# Patient Record
Sex: Male | Born: 1940 | Race: White | Hispanic: No | Smoking: Former smoker
Health system: Southern US, Community
[De-identification: ages and names within clinical notes are randomized; demographics above are authoritative.]

## PROBLEM LIST (undated history)

## (undated) DIAGNOSIS — E7800 Pure hypercholesterolemia, unspecified: Secondary | ICD-10-CM

## (undated) DIAGNOSIS — J449 Chronic obstructive pulmonary disease, unspecified: Secondary | ICD-10-CM

## (undated) DIAGNOSIS — I10 Essential (primary) hypertension: Secondary | ICD-10-CM

## (undated) HISTORY — PX: SPLENECTOMY, TOTAL: SHX788

## (undated) HISTORY — PX: CHOLECYSTECTOMY: SHX55

## (undated) HISTORY — PX: BLADDER REPAIR: SHX76

---

## 1998-04-28 ENCOUNTER — Encounter: Payer: Self-pay | Admitting: Emergency Medicine

## 1998-04-29 ENCOUNTER — Inpatient Hospital Stay (HOSPITAL_COMMUNITY): Admission: EM | Admit: 1998-04-29 | Discharge: 1998-05-04 | Payer: Self-pay | Admitting: Emergency Medicine

## 1998-05-08 ENCOUNTER — Encounter: Admission: RE | Admit: 1998-05-08 | Discharge: 1998-05-08 | Payer: Self-pay | Admitting: Internal Medicine

## 1998-06-21 ENCOUNTER — Ambulatory Visit (HOSPITAL_COMMUNITY): Admission: RE | Admit: 1998-06-21 | Discharge: 1998-06-21 | Payer: Self-pay | Admitting: Internal Medicine

## 1998-06-21 ENCOUNTER — Encounter: Admission: RE | Admit: 1998-06-21 | Discharge: 1998-06-21 | Payer: Self-pay | Admitting: Internal Medicine

## 1998-07-03 ENCOUNTER — Encounter: Payer: Self-pay | Admitting: *Deleted

## 1998-07-03 ENCOUNTER — Ambulatory Visit (HOSPITAL_COMMUNITY): Admission: RE | Admit: 1998-07-03 | Discharge: 1998-07-03 | Payer: Self-pay | Admitting: *Deleted

## 2001-03-07 ENCOUNTER — Encounter: Payer: Self-pay | Admitting: Urology

## 2001-03-07 ENCOUNTER — Encounter: Admission: RE | Admit: 2001-03-07 | Discharge: 2001-03-07 | Payer: Self-pay | Admitting: Urology

## 2001-03-14 ENCOUNTER — Encounter: Payer: Self-pay | Admitting: Urology

## 2001-03-14 ENCOUNTER — Ambulatory Visit: Admission: RE | Admit: 2001-03-14 | Discharge: 2001-03-14 | Payer: Self-pay | Admitting: Urology

## 2001-05-03 ENCOUNTER — Inpatient Hospital Stay (HOSPITAL_COMMUNITY): Admission: RE | Admit: 2001-05-03 | Discharge: 2001-05-10 | Payer: Self-pay | Admitting: *Deleted

## 2001-05-03 ENCOUNTER — Encounter (INDEPENDENT_AMBULATORY_CARE_PROVIDER_SITE_OTHER): Payer: Self-pay

## 2002-11-02 ENCOUNTER — Ambulatory Visit: Admission: RE | Admit: 2002-11-02 | Discharge: 2002-11-02 | Payer: Self-pay | Admitting: Surgery

## 2003-10-08 ENCOUNTER — Ambulatory Visit (HOSPITAL_COMMUNITY): Admission: RE | Admit: 2003-10-08 | Discharge: 2003-10-08 | Payer: Self-pay | Admitting: Internal Medicine

## 2004-10-09 ENCOUNTER — Ambulatory Visit (HOSPITAL_COMMUNITY): Admission: RE | Admit: 2004-10-09 | Discharge: 2004-10-09 | Payer: Self-pay | Admitting: Internal Medicine

## 2005-02-16 ENCOUNTER — Ambulatory Visit (HOSPITAL_COMMUNITY): Admission: RE | Admit: 2005-02-16 | Discharge: 2005-02-16 | Payer: Self-pay | Admitting: Urology

## 2005-02-16 ENCOUNTER — Ambulatory Visit (HOSPITAL_BASED_OUTPATIENT_CLINIC_OR_DEPARTMENT_OTHER): Admission: RE | Admit: 2005-02-16 | Discharge: 2005-02-16 | Payer: Self-pay | Admitting: Urology

## 2005-02-16 ENCOUNTER — Encounter (INDEPENDENT_AMBULATORY_CARE_PROVIDER_SITE_OTHER): Payer: Self-pay | Admitting: *Deleted

## 2005-03-23 ENCOUNTER — Encounter (INDEPENDENT_AMBULATORY_CARE_PROVIDER_SITE_OTHER): Payer: Self-pay | Admitting: *Deleted

## 2005-03-24 ENCOUNTER — Inpatient Hospital Stay (HOSPITAL_COMMUNITY): Admission: RE | Admit: 2005-03-24 | Discharge: 2005-03-30 | Payer: Self-pay | Admitting: Urology

## 2005-04-15 ENCOUNTER — Ambulatory Visit (HOSPITAL_COMMUNITY): Admission: RE | Admit: 2005-04-15 | Discharge: 2005-04-15 | Payer: Self-pay | Admitting: Internal Medicine

## 2006-01-01 ENCOUNTER — Ambulatory Visit (HOSPITAL_COMMUNITY): Admission: RE | Admit: 2006-01-01 | Discharge: 2006-01-01 | Payer: Self-pay | Admitting: Surgery

## 2006-01-01 ENCOUNTER — Encounter (INDEPENDENT_AMBULATORY_CARE_PROVIDER_SITE_OTHER): Payer: Self-pay | Admitting: Specialist

## 2006-04-07 ENCOUNTER — Encounter: Admission: RE | Admit: 2006-04-07 | Discharge: 2006-07-06 | Payer: Self-pay | Admitting: Internal Medicine

## 2007-11-24 ENCOUNTER — Ambulatory Visit (HOSPITAL_COMMUNITY): Admission: RE | Admit: 2007-11-24 | Discharge: 2007-11-24 | Payer: Self-pay | Admitting: Internal Medicine

## 2008-07-02 ENCOUNTER — Encounter (INDEPENDENT_AMBULATORY_CARE_PROVIDER_SITE_OTHER): Payer: Self-pay | Admitting: *Deleted

## 2008-10-03 ENCOUNTER — Ambulatory Visit: Payer: Self-pay | Admitting: Cardiology

## 2008-10-03 DIAGNOSIS — E1129 Type 2 diabetes mellitus with other diabetic kidney complication: Secondary | ICD-10-CM | POA: Insufficient documentation

## 2008-10-03 DIAGNOSIS — N320 Bladder-neck obstruction: Secondary | ICD-10-CM

## 2008-10-03 DIAGNOSIS — E782 Mixed hyperlipidemia: Secondary | ICD-10-CM

## 2008-10-03 DIAGNOSIS — Z6825 Body mass index (BMI) 25.0-25.9, adult: Secondary | ICD-10-CM

## 2008-10-10 ENCOUNTER — Telehealth (INDEPENDENT_AMBULATORY_CARE_PROVIDER_SITE_OTHER): Payer: Self-pay | Admitting: *Deleted

## 2008-10-11 ENCOUNTER — Encounter: Payer: Self-pay | Admitting: Cardiology

## 2008-10-11 ENCOUNTER — Ambulatory Visit: Payer: Self-pay

## 2008-10-31 ENCOUNTER — Telehealth: Payer: Self-pay | Admitting: Cardiology

## 2008-12-28 ENCOUNTER — Encounter: Payer: Self-pay | Admitting: Internal Medicine

## 2009-01-22 ENCOUNTER — Ambulatory Visit: Payer: Self-pay | Admitting: Internal Medicine

## 2009-01-24 ENCOUNTER — Encounter: Payer: Self-pay | Admitting: Internal Medicine

## 2009-02-19 ENCOUNTER — Encounter: Payer: Self-pay | Admitting: Internal Medicine

## 2009-02-19 ENCOUNTER — Ambulatory Visit: Payer: Self-pay | Admitting: Internal Medicine

## 2009-02-26 ENCOUNTER — Encounter: Payer: Self-pay | Admitting: Internal Medicine

## 2009-03-19 ENCOUNTER — Telehealth (INDEPENDENT_AMBULATORY_CARE_PROVIDER_SITE_OTHER): Payer: Self-pay | Admitting: *Deleted

## 2010-07-31 LAB — GLUCOSE, CAPILLARY
Glucose-Capillary: 122 mg/dL — ABNORMAL HIGH (ref 70–99)
Glucose-Capillary: 138 mg/dL — ABNORMAL HIGH (ref 70–99)

## 2010-09-12 NOTE — Op Note (Signed)
[facility]  Patient:    [name], [name] Visit Number: [id number] MRN: [id number]          Service Type: SUR Location: 2S 0251 01 Attending Physician:  Vikki Ports. Dictated by:   Vikki Ports, M.D. Proc. Date: 05/03/01 Admit Date:  05/03/2001   CC:         [name], M.D.   Operative Report  PREOPERATIVE DIAGNOSES:  1. 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  2. Cholelithiasis.  POSTOPERATIVE DIAGNOSES:  Not given.  SURGEON:  Vikki Ports, M.D.  ASSISTANT:  1. [name], M.D.  2. [name], M.D.  ANESTHESIA:  General endotracheal tube.  ESTIMATED BLOOD LOSS:  1.5 liters.  TRANSFUSION:  12 units of packed red blood cells and a ______ pack of platelets.  DRAINS:  A 19 Blake drain in the left upper quadrant.  DESCRIPTION OF PROCEDURE:  The patient was taken to the operating room and placed in the supine position. After adequate anesthesia was induced via the endotracheal tube, the abdomen and perineum were prepped and draped in the normal sterile fashion. Using a vertical midline incision, I dissected down to the fascia which was opened vertically. The peritoneum was entered. On entering the abdomen, there was noted a massively enlarged spleen extending medially across the left lobe of the liver superior to the diaphragm and inferiorly to the pelvic brim. There were very dense omental adhesions and the splenic flexure of the colon was pushed inferiorly down near the left iliac vessels. I began by taking down a significant amount of omentum densely adhered to the anterior surface of the spleen. The entire splenic capsule was encased in omentum and densely adhered to the retroperitoneum, diaphragm and the left lobe of the liver. Very tedious two hour dissection was undertaken to try to free up the spleen. Posterior attachments were very difficult to expose secondary to  the massive size of the spleen. The lesser sac was entered and the hilum of the spleen was exposed. 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 The inferior splenic veins were suture ligated using #0 silk ligatures on mobilizing the inferior portion of the spleen, there was massive arterial and venous bleeding from the superior hilum. A significant amount of blood was lost.  Using digital pressure, this was controlled. 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 Also numerous splenic vein tributaries were also identified bleeding and were ligated. After bleeding was well controlled, the posterior attachments to the spleen were then removed and the spleen was delivered out of the abdominal cavity. It weighed almost six pounds after removal. The splenic bud was then coagulated and Surgicel was placed along the retroperitoneum in the diaphragm. The diaphragm was inspected and there appeared to be no defects within the diaphragm and the left lobe of the liver was not bleeding. Packs were placed in the left upper quadrant and along the tail of the pancreas.  I then turned my attention to the gallbladder. 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 The cystic duct was identified down near the infundibulum and was dissected free, ligated with an #0 silk ligature and divided. The cystic artery was then identified, clamped and divided. The gallbladder was taken off the gallbladder bed using Bovie electrocautery. Adequate hemostasis was ensured. Again I inspected the left upper quadrant and there was no obvious injury to the pancreatic tail but a  19 Blake drain was placed through a separate stab wound and near the tail of the pancreas. Adequate hemostasis was ensured. At this portion of the case, Dr. Wanda Plump performed cystotomy with bladder stone removal and that will be dictated in a separate report. The fascia was  then closed with a running #1 Novofil. The skin was closed with staples. The patient tolerated the procedure but remained intubated and transferred to the recovery room and then to the intensive care unit in critical condition. Dictated by:   Vikki Ports, M.D. Attending Physician:  Danna Hefty R. DD:  05/03/01 TD:  05/03/01 Job: [id number]

## 2010-09-12 NOTE — Op Note (Signed)
NAME:  [name], [name]                 ACCOUNT NO.:  [id number]   MEDICAL RECORD NO.:  [id number]          PATIENT TYPE:  AMB   LOCATION:  [facility]                         FACILITY:  [facility]   PHYSICIAN:  Sigmund I. Patsi Sears, M.D.DATE OF BIRTH:  [birth year]   DATE OF PROCEDURE:  02/16/2005  DATE OF DISCHARGE:                                 OPERATIVE REPORT   PREOPERATIVE DIAGNOSIS:  PSA elevation of 16.   POSTOPERATIVE DIAGNOSIS:  PSA elevation of 16.   OPERATION:  Saturation biopsies of the prostate.   SURGEON:  Sigmund I. Patsi Sears, M.D.   RADIATION TECHNOLOGIST:  Elnita Maxwell.   PREPARATION:  After appropriate pre-anesthesia, the patient was brought to  the operating room and placed on the operating table in the dorsal supine  position where general LMA anesthesia was induced.  He was then replaced in  dorsal lithotomy position and was prepped with Betadine solution and draped  in the usual fashion.   REVIEW OF HISTORY:  Mr. Quirk is a 70 year old male, with a history of PSA  elevation of 16, AUA symptom score of 14, prostate size 138 mL, with an  abnormal PSA-2, for prostate biopsy today prior to consideration of  treatment of his bladder outlet.   PAST MEDICAL HISTORY:  1.  Bladder stone disease.  2.  Cholelithiasis.  3.  Massive splenomegaly.  4.  __________ .  5.  In 2003, the patient underwent splenectomy, as well as cystotomy and      removal of bladder stone.   PROCEDURE:  With the patient in the dorsal lithotomy position, the perineum  was prepped and draped in the usual fashion.  The transrectal ultrasound  unit was then placed and stabilized in position.  The prostate was  thoroughly evaluated, and there was no evidence of transurethral stone or  bladder abnormality.  The prostate was large as noted above (138 mL).   Cores were taken then from the right lateral, left lateral, right medial,  left medial, and apical areas.  Mild bleeding was noted perineally.  There  was  a suggestion of hematuria with Foley catheter placement, but no gross  hematuria.  The patient was then awakened and taken to the recovery room in  good condition.  A total of 20 biopsies were obtained.      Sigmund I. Patsi Sears, M.D.  Electronically Signed     SIT/MEDQ  D:  02/16/2005  T:  02/16/2005  Job:  [id number]

## 2010-09-12 NOTE — Op Note (Signed)
[facility]  Patient:    [name], [name] Visit Number: [id number] MRN: [id number]          Service Type: SUR Location: 4E 0420 01 Attending Physician:  Vikki Ports Dictated by:   Verl Dicker, M.D. Proc. Date: 05/03/01 Admit Date:  05/03/2001 Discharge Date: 05/10/2001                             Operative Report  DATE OF BIRTH:  [birth year]  PREOPERATIVE DIAGNOSIS:  Large bladder stone.  POSTOPERATIVE DIAGNOSIS:  Large bladder stone.  PROCEDURE: 1. Removal of stone. 2. Placement of 20 French Foley. 3. The procedure was done at the time of Dr. Lillie Columbia splenectomy and cholecystectomy.  DESCRIPTION OF PROCEDURE:  Once Dr. Luan Pulling had completed splenectomy and cholecystectomy, vertical midline incision was continued down to the pelvis. Indwelling Foley catheter was used to inflate the bladder with about 600 cc of normal saline.  An incision was made along the dome of the bladder.  A 3.5 cm opening made in the bladder.  A 3.4 cm stone was then grasped with ring forceps and gently extracted from the bladder.  The bladder was closed in two layers with 2-0 Vicryl.  Foley catheter was left to straight drain. Dr. Luan Pulling will dictate her portion of the procedure. Dictated by:   Verl Dicker, M.D. Attending Physician:  Danna Hefty R. DD:  05/20/01 TD:  05/21/01 Job: [id number]

## 2010-09-12 NOTE — Discharge Summary (Signed)
[name], [name]                 ACCOUNT NO.:  [id number]   MEDICAL RECORD NO.:  [id number]          PATIENT TYPE:  INP   LOCATION:  1416                         FACILITY:  [facility]   PHYSICIAN:  Sigmund I. Patsi Sears, M.D.DATE OF BIRTH:  03-Nov-1940   DATE OF ADMISSION:  03/23/2005  DATE OF DISCHARGE:  03/30/2005                                 DISCHARGE SUMMARY   DISCHARGE DIAGNOSES:  1.  Benign prostatic hypertrophy.  2.  Leukocytosis.   PROCEDURE:  Simple retropubic prostatectomy; surgeon, Sigmund I. Patsi Sears,  M.D.   CONSULTATIONS:  None.   HOSPITAL COURSE:  On March 23, 2005, the patient was taken to the  operating room and underwent the above named procedure which he tolerated  without complications. For a more complete description of his procedure,  please consult the operative note. Postoperatively, he was taken to the PACU  and then to the floor in stable condition where he remained throughout his  hospitalization. His hospitalization progressed with toleration of  ambulation, diet and pain. His lower urinary tract was managed with a Foley  with continuous bladder irrigation which was weaned gradually over the first  several postoperative days. It was eventually weaned to off after the  patient's urine output quantitatively became without hematuria. He was also  managed with a Jackson-Pratt drain over his prostatic fossa which was  removed on postoperative day #4 after the output had decreased to less than  10 mL per shift. His hospitalization was complicated by several factors.  Immediately postoperatively, we obtained a CBC and set of electrolytes from  the PACU to check for baseline levels. Again this was immediately  postoperatively. He was noted at this point to have leukocytosis with an  elevated white count of approximately 33.4. He was placed on normal  postoperative antibiotic prophylaxis to cover skin pathogens. His white  count decreased to 18.4 the next  day but subsequently increased to 34.6  where it remained throughout the remainder of his hospitalization. A CBC  with DIF was obtained which showed absolute neutrophil count of 16.5 with an  absolute lymphocyte count of 1.9 and absolute monocyte count of 3.2 with  absolute eosinophils 1.6 without basophils. The patient also had low grade  fevers throughout the first several postoperative days with a clinical  picture that was only concerning for atelectasis, however, because of the  increased white count, we did obtain a chest radiograph which did not show  any infiltrates or masses. We also obtained urine and blood cultures which  showed no growth. Clinically, the patient appeared well throughout his  hospitalization. He did have an area of erythema around his lower midline  wound. There was blistering associated with this and it was felt to be a  reaction to his Steri-Strip wound coverage. However, because of the  increased white count, we did prophylactically keep the patient's skin  covered which consisted of q.i.d. Keflex. On March 30, 2005, it was  determined that the patient was in stable condition to be discharged home.   EXAMINATION AT THE TIME OF DISCHARGE:  VITAL SIGNS:  Temperature 99.1, pulse  83, respirations 18, blood pressure 141/80 satting 99% on room air.  CHEST:  Regular rate and rhythm without murmurs, rubs or gallops. Chest:  clear to auscultation.  ABDOMEN:  Positive bowel sounds, soft, nondistended, nontender to palpation.  The lower midline incision is clean, dry and intact. Steri-Strips are in  place. There is moderate blistering under the Steri-Strips throughout. There  is erythema around the blistering.  EXTREMITIES:  Without edema, cyanosis or clubbing.   DISCHARGE INSTRUCTIONS:  The patient was scheduled to followup with Dr.  Patsi Sears in approximately 1 week for a wound check as well as removing the  catheter and a trial of voiding. He was instructed not  to lift more than 10  pounds for the next 6 weeks. He was instructed to return if he experiences  any fever, chills, nausea, vomiting, increased redness or drainage from his  wound. He was also given a Foley leg and Foley care instructions, instructed  to call if he has any decrease in drainage from his Foley catheter or  drainage around his catheter. He understands and agrees with these  instructions.  At the time of followup with Dr. Patsi Sears, he will be  scheduled to see his primary care physician for investigation of his  leukocytosis.     ______________________________  Glade Nurse, MD      Sigmund I. Patsi Sears, M.D.  Electronically Signed    MT/MEDQ  D:  04/07/2005  T:  04/08/2005  Job:  [id number]

## 2010-09-12 NOTE — H&P (Signed)
[name], [name]                 ACCOUNT NO.:  [id number]   MEDICAL RECORD NO.:  [id number]          PATIENT TYPE:  OIB   LOCATION:  1416                         FACILITY:  [facility]   PHYSICIAN:  Sigmund I. Patsi Sears, M.D.DATE OF BIRTH:  Mar 17, 1941   DATE OF ADMISSION:  03/23/2005  DATE OF DISCHARGE:                                HISTORY & PHYSICAL   CHIEF COMPLAINT:  1.  Lower urinary tract symptoms.  2.  Benign prostatic hypertrophy.  3.  Elevated prostate-specific antigen.   HISTORY OF PRESENT ILLNESS:  This 70 year old gentleman with a history of  elevated PSA, status post saturation prostate biopsy with benign findings.  The patient also has a known enlarged prostate under 40 grams and moderate  to severe lower urinary tract symptoms.  The patient has failed a trial of  medications and after reviewing his options has elected to proceed with open  prostatectomy.   PAST MEDICAL HISTORY:  1.  Dyslipidemia.  2.  Chronic obstructive pulmonary disease.  3.  BPH.  4.  Congenital sclerosis of the testis.  5.  Bladder calculi.   PAST SURGICAL HISTORY:  1.  Splenectomy.  2.  Cholecystectomy.  3.  Open cystolithotomy.   MEDICATIONS:  1.  Crestor.  2.  Niacin.  3.  Aspirin.  4.  Folic acid.  5.  ________.  6.  Flomax.  7.  Flax seed oil.   ALLERGIES:  1.  CIPRO.  2.  NEOSPORIN OINTMENT.   SOCIAL HISTORY:  The patient is unmarried and retired.  He also has a  longstanding tobacco history with none since 1995.  He denies any alcohol or  drug use.   FAMILY HISTORY:  Significant for CHF, hypertension and diabetes.   REVIEW OF SYSTEMS:  Multisystem review of systems is negative unless noted  above.   PHYSICAL EXAMINATION:  For complete physical exam, please consult note dated  February 24, 2005, that is part of the permanent medical record number and is  unchanged.   ASSESSMENT AND PLAN:  This 70 year old with enlarged prostate of  approximately 140 grams with history  of bladder stone will proceed with open  simple retropubic prostatectomy.     ______________________________  Glade Nurse, MD      Sigmund I. Patsi Sears, M.D.  Electronically Signed    MT/MEDQ  D:  03/23/2005  T:  03/23/2005  Job:  [id number]

## 2010-09-12 NOTE — Discharge Summary (Signed)
[facility]  Patient:    [name], [name] Visit Number: [id number] MRN: [id number]          Service Type: Attending:  Catalina Lunger, M.D. Dictated by:   Catalina Lunger, M.D. Adm. Date:  05/03/01 Disc. Date: 05/10/01   CC:         Verl Dicker, M.D.   Discharge Summary  ADMISSION DIAGNOSES: 1. Hereditary spherocytosis. 2. Massive splenomegaly. 3. Cholelithiasis. 4. Bladder calculus.  DISCHARGE DIAGNOSES: 1. Hereditary spherocytosis. 2. Massive splenomegaly. 3. Cholelithiasis. 4. Bladder calculus.  CONDITION ON DISCHARGE:  Good and improved.  DISPOSITION:  The patient is discharged to home.  Followup is with me two weeks after discharge.  BRIEF HISTORY OF PRESENT ILLNESS:  The patient is a 70 year old white male with a long history of hereditary spherocytosis.  The patient was found to have a large bladder calculus by Dr. Boston Service and on history and physical exam was found to have a massively enlarged spleen.  The patient had been having increasing pain in the left upper quadrant over the last six months.  The patient was also found to have a very large gallbladder stone on plain films.  The patient was admitted for splenectomy, cholecystectomy, and cystotomy with removal of bladder stone.  For the remainder of the H&P, please see the chart.  HOSPITAL COURSE:  The patient was admitted, taken to the operating room, where he underwent splenectomy, cholecystectomy, and cystotomy with removal of bladder calculus.  Intraoperative course was complicated by massive hemorrhage requiring 13 units of packed cells, 6 packs of platelets, and 2 units of FFP. Postoperatively, the patient was monitored in the intensive care unit and was maintained on the ventilator.  On postoperative day #1, the patient was awake, alert, and in no respiratory distress and was extubated.  JP drain, which was placed at the tail of the pancreas,  was left in place throughout his hospital stay.  He had a Foley catheter which was also left in place.  His ileus took at least 3-4 days to resolve.  He was maintained on IV fluids and IV PCA.  The patient had a questionable alcohol history and was placed on a standard DT prophylaxis protocol.  The patient did have some confusion on the Ativan protocol, and therefore this was discontinued on postoperative day #5.  The patient did well and was started on a clear-liquid diet.  On postoperative day #3, his diet was slowly advanced over the next two days.  On postoperative day #7, he was tolerating a regular diet.  His JP drainage was minimal.  The amylase was checked and was found to be 32.  The JP drain was removed, staples were removed, and the patient was discharged home. Dictated by:   Catalina Lunger, M.D. Attending:  Catalina Lunger, M.D. DD:  05/11/01 TD:  05/13/01 Job: [id number]

## 2010-09-12 NOTE — Op Note (Signed)
NAMEJACEN, [name]                 ACCOUNT NO.:  [id number]   MEDICAL RECORD NO.:  [id number]          PATIENT TYPE:  OIB   LOCATION:  1416                         FACILITY:  [facility]   PHYSICIAN:  Sigmund I. Patsi Sears, M.D.DATE OF BIRTH:  [birth year]   DATE OF PROCEDURE:  03/23/2005  DATE OF DISCHARGE:                                 OPERATIVE REPORT   PREOPERATIVE DIAGNOSIS:  Benign prostatic hypertrophy.   POSTOPERATIVE DIAGNOSIS:  Benign prostatic hypertrophy.   PROCEDURE:  Simple retropubic prostatectomy.   SURGEON:  Sigmund I. Patsi Sears, M.D.   ASSISTANT:  Glade Nurse, MD   ANESTHESIA:  General endotracheal.   SPECIMENS:  Prostatic adenoma.   DRAINS:  Closed fluted Blake drain in the space of Retzius.   ANESTHESIA:  General endotracheal.   DESCRIPTION OF PROCEDURE:  The patient was identified by his wrist bracelet,  brought to room #4 where he received preoperative antibiotics and was  administered general anesthesia.  He was prepped and draped in the usual  sterile fashion taking care to minimize peripheral neuropathy or compartment  syndrome.  Next, we made a 10 cm low midline incision from his pubic  symphysis to approximately 3 cm below the umbilicus.  We dissected down  through the dermis, subcutaneous fascia and through Scarpa fascia with Bovie  cautery, maintaining hemostasis as we progressed.  The anterior fascia was  identified and split with Bovie cautery.  We identified the medial aspect of  the pyramidalis and rectus muscles of the serosal bluntly and next we  bluntly developed a space Retzius and a new retractor was placed.  We then  palpated the balloon on the Foley catheter and the bladder neck.  We did  remove some fat over the anterior surface of prostate with Metzenbaum  scissors.  Next, we placed traction sutures laterally over the capsule.  There was moderate amount of bleeding with each of these being placed.  We  did continue this traction  suture by converting it to a figure-of-eight  suture which controlled the bleeding nicely.  Next, we opened the anterior  prostatic capsule with a horizontal incision with Bovie cautery.  Next, we  created a plane between the capsule and the adenoma with Metzenbaum scissors  circumferentially around our anterior incision.  Next, using the surgeon's  first finger we enucleated the adenoma from the level of the apex to the  bladder base in 360 degree fashion.  This was done with great difficulty,  especially on the patient's left side at the base and the patient's right  side at the apex.  There was a good deal of inflammation and loss of tissue  planes at these levels, possibly to previous saturation biopsies.  This was  done and the prostatic adenoma was passed off the field.  We then inspected  our fossa.  Because of the large size of the gland and the difficulty in  enucleating the adenoma, the prostatic capsule was torn in multiple places  and felt unsuitable to reapproximate over the catheter.  Because of this we  placed sutures in an inside-out fashion  at the level of the urethra at the  2, 5, 7 and 10 o'clock positions with 2-0 Monocryl.  These were then placed  through the level of the bladder neck at the adjacent position.  The tails  were trimmed and the vesicle urethral anastomosis was tied down securing the  posterior sutures first.  Next, the Foley catheter was placed on traction.  We irrigated the Foley catheter.  Excellent clear urine output was obtained.  We next instilled several hundred mL in the  bladder.  A small amount of  leakage from the anastomosis was noted.  Next, we made a 0.25 cm left lower  quadrant transverse incision and brought a Vanderbilt through lateral to the  cut fascial edge and brought a Blake drain through on a pass.  We placed it  over the anastomosis in the space of Retzius.  Next, we inspected our  surgical bed.  It was found to be hemostatic.  We  then brought together the  rectus body muscles with interrupted 2-0 Vicryl. We closed the fascia with a  running #1 PDS.  Skin was closed with a running 4-0 subcuticular Monocryl.  A 4-0 nylon drain stitch was used to secure the drain to the level of skin.  Dressings were applied.  Foley catheter was attached to a leg bag.  It was  again flushed, and clear blue urine output was obtained. Next, the patient  was reversed from his anesthesia which he tolerated without complications.  Please note, Dr. Jethro Bolus was present and participated in all  aspects of this case.     ______________________________  Glade Nurse, MD      Sigmund I. Patsi Sears, M.D.  Electronically Signed    MT/MEDQ  D:  03/23/2005  T:  03/23/2005  Job:  [id number]

## 2010-09-12 NOTE — Op Note (Signed)
NAMEDAIJON, [name]                 ACCOUNT NO.:  [id number]   MEDICAL RECORD NO.:  [id number]          PATIENT TYPE:  AMB   LOCATION:  SDS                          FACILITY:  [facility]   PHYSICIAN:  Thornton Park. 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   PROCEDURE:  Wide excision, lesion right forearm.   SURGEON:  Thornton Park. Ryan Deutscher, MD   ANESTHESIA:  MAC.   DESCRIPTION OF PROCEDURE:  [name] was taken to room one at [facility] 01/01/2006 and the given some sedation.  The area was  prepped with Betadine and had previously been marked by me in the holding  area and with the patient's help.  I infiltrated it with 1% lidocaine with  some sodium bicarb and then described an ellipse along the axis of the arm,  and carried this down into the fatty tissue and removed in toto.  I marked  it distally with a suture in a sponge.  I then closed this in layers with 4-  0 Vicryl subcuticularly and then with a running 4-0 nylon.  Telfa dressing  was applied and a Kerlix.  The patient's instructions were given for return  for suture removal in 10 days.  He was given a prescription for  Vicodin to  use for pain.      Thornton Park Ryan Deutscher, MD  Electronically Signed     MBM/MEDQ  D:  01/01/2006  T:  01/01/2006  Job:  [id number]   cc:   Hope M. Danella Deis, M.D.

## 2010-11-12 ENCOUNTER — Encounter: Payer: Self-pay | Admitting: Cardiology

## 2011-03-14 ENCOUNTER — Emergency Department (HOSPITAL_COMMUNITY): Payer: Medicare Other

## 2011-03-14 ENCOUNTER — Other Ambulatory Visit: Payer: Self-pay

## 2011-03-14 ENCOUNTER — Emergency Department (HOSPITAL_COMMUNITY)
Admission: EM | Admit: 2011-03-14 | Discharge: 2011-03-14 | Disposition: A | Discharge status: Home / Self Care | Payer: Medicare Other | Attending: Emergency Medicine | Admitting: Emergency Medicine

## 2011-03-14 DIAGNOSIS — J4489 Other specified chronic obstructive pulmonary disease: Secondary | ICD-10-CM | POA: Insufficient documentation

## 2011-03-14 DIAGNOSIS — I10 Essential (primary) hypertension: Secondary | ICD-10-CM | POA: Insufficient documentation

## 2011-03-14 DIAGNOSIS — R0602 Shortness of breath: Secondary | ICD-10-CM | POA: Insufficient documentation

## 2011-03-14 DIAGNOSIS — J449 Chronic obstructive pulmonary disease, unspecified: Secondary | ICD-10-CM | POA: Insufficient documentation

## 2011-03-14 DIAGNOSIS — R000 Tachycardia, unspecified: Secondary | ICD-10-CM | POA: Insufficient documentation

## 2011-03-14 DIAGNOSIS — E119 Type 2 diabetes mellitus without complications: Secondary | ICD-10-CM | POA: Insufficient documentation

## 2011-03-14 DIAGNOSIS — J029 Acute pharyngitis, unspecified: Secondary | ICD-10-CM | POA: Insufficient documentation

## 2011-03-14 DIAGNOSIS — R918 Other nonspecific abnormal finding of lung field: Secondary | ICD-10-CM | POA: Insufficient documentation

## 2011-03-14 DIAGNOSIS — M5030 Other cervical disc degeneration, unspecified cervical region: Secondary | ICD-10-CM | POA: Insufficient documentation

## 2011-03-14 HISTORY — DX: Pure hypercholesterolemia, unspecified: E78.00

## 2011-03-14 HISTORY — DX: Chronic obstructive pulmonary disease, unspecified: J44.9

## 2011-03-14 HISTORY — DX: Essential (primary) hypertension: I10

## 2011-03-14 LAB — COMPREHENSIVE METABOLIC PANEL
ALT: 15 U/L (ref 0–53)
AST: 10 U/L (ref 0–37)
Albumin: 3.3 g/dL — ABNORMAL LOW (ref 3.5–5.2)
BUN: 14 mg/dL (ref 6–23)
CO2: 25 mEq/L (ref 19–32)
Calcium: 9.7 mg/dL (ref 8.4–10.5)
Chloride: 93 mEq/L — ABNORMAL LOW (ref 96–112)
Creatinine, Ser: 0.74 mg/dL (ref 0.50–1.35)
GFR calc Af Amer: >90 mL/min (ref >90)
GFR calc non Af Amer: 90 mL/min — ABNORMAL LOW (ref >90)
Sodium: 129 mEq/L — ABNORMAL LOW (ref 135–145)
Total Bilirubin: 0.5 mg/dL (ref 0.3–1.2)
Total Protein: 7.2 g/dL (ref 6.0–8.3)

## 2011-03-14 LAB — DIFFERENTIAL
Basophils Absolute: 0 10*3/uL (ref 0.0–0.1)
Basophils Relative: 0 % (ref 0–1)
Eosinophils Absolute: 0.3 10*3/uL (ref 0.0–0.7)
Eosinophils Relative: 1 % (ref 0–5)
Lymphocytes Relative: 12 % (ref 12–46)
Lymphs Abs: 3.5 10*3/uL (ref 0.7–4.0)
Monocytes Absolute: 4.7 10*3/uL — ABNORMAL HIGH (ref 0.1–1.0)
Neutro Abs: 20.9 10*3/uL — ABNORMAL HIGH (ref 1.7–7.7)
Neutrophils Relative %: 71 % (ref 43–77)

## 2011-03-14 LAB — CBC
HCT: 44.1 % (ref 39.0–52.0)
MCH: 29 pg (ref 26.0–34.0)
MCHC: 34.7 g/dL (ref 30.0–36.0)
MCV: 83.7 fL (ref 78.0–100.0)
Platelets: 361 10*3/uL (ref 150–400)
RBC: 5.27 MIL/uL (ref 4.22–5.81)
RDW: 13.3 % (ref 11.5–15.5)

## 2011-03-14 LAB — RAPID STREP SCREEN (MED CTR MEBANE ONLY): Streptococcus, Group A Screen (Direct): NEGATIVE

## 2011-03-14 MED ORDER — IPRATROPIUM BROMIDE 0.02 % IN SOLN
0.5000 mg | Freq: Once | RESPIRATORY_TRACT | Status: AC
  Administered 2011-03-14: 0.5 mg via RESPIRATORY_TRACT
  Filled 2011-03-14: qty 2.5

## 2011-03-14 MED ORDER — IOHEXOL 300 MG/ML  SOLN
100.0000 mL | Freq: Once | INTRAMUSCULAR | Status: AC | PRN
  Administered 2011-03-14: 100 mL via INTRAVENOUS

## 2011-03-14 MED ORDER — AZITHROMYCIN 250 MG PO TABS: 250.0000 mg | ORAL_TABLET | Freq: Every day | ORAL | Status: AC

## 2011-03-14 MED ORDER — ALBUTEROL SULFATE HFA 108 (90 BASE) MCG/ACT IN AERS
2.0000 | INHALATION_SPRAY | Freq: Once | RESPIRATORY_TRACT | Status: AC
  Administered 2011-03-14: 2 via RESPIRATORY_TRACT
  Filled 2011-03-14: qty 6.7

## 2011-03-14 MED ORDER — PREDNISONE 20 MG PO TABS
60.0000 mg | ORAL_TABLET | Freq: Once | ORAL | Status: AC
  Administered 2011-03-14: 60 mg via ORAL
  Filled 2011-03-14: qty 3

## 2011-03-14 MED ORDER — ALBUTEROL SULFATE (5 MG/ML) 0.5% IN NEBU
5.0000 mg | INHALATION_SOLUTION | Freq: Once | RESPIRATORY_TRACT | Status: AC
  Administered 2011-03-14: 5 mg via RESPIRATORY_TRACT
  Filled 2011-03-14: qty 1

## 2011-03-14 MED ORDER — PREDNISONE 10 MG PO TABS: 50.0000 mg | ORAL_TABLET | Freq: Every day | ORAL | Status: AC

## 2011-03-14 MED ORDER — AZITHROMYCIN 250 MG PO TABS
500.0000 mg | ORAL_TABLET | Freq: Once | ORAL | Status: AC
  Administered 2011-03-14: 500 mg via ORAL
  Filled 2011-03-14: qty 2

## 2011-03-14 NOTE — ED Notes (Signed)
Patient transported to X-ray 

## 2011-03-14 NOTE — ED Provider Notes (Signed)
History     CSN: [id number] Arrival date & time: 03/14/2011  8:04 AM   First MD Initiated Contact with Patient 03/14/11 [phone]      Chief Complaint  Patient presents with  . Shortness of Breath    pt in with sob x3 days states hx of copd  pt denies pain in chest and back states throat is sore states non productive cough pt currently 97% on room air    (Consider location/radiation/quality/duration/timing/severity/associated sxs/prior treatment) HPI  Patient states sob since Thursday.  History copd.  Patient states throat froze up and unable to breath.  Feels like he wants to cough but can't.  Taking po well.  No fever.  Nonproductive cough.  No pain except throat.  [facility] [place]  Patient uses spiriva and inhaler.  Patient states he can't tell any difference with inhaler.  No oxygen.    Past Medical History  Diagnosis Date  . COPD (chronic obstructive pulmonary disease)   . Emphysema   . Hypertension   . Diabetes mellitus   . Hypercholesteremia     Past Surgical History  Procedure Date  . Bladder repair   . Cholecystectomy   . Splenectomy, total     No family history on file.  History  Substance Use Topics  . Smoking status: Former Games developer  . Smokeless tobacco: Not on file  . Alcohol Use: No      Review of Systems  All other systems reviewed and are negative.    Allergies  Ciprofloxacin and Triple antibiotic  Home Medications  No current outpatient prescriptions on file.  BP 154/85  Pulse 111  Temp(Src) 99.1 F (37.3 C) (Oral)  Resp 20  SpO2 98%  Physical Exam  Nursing note and vitals reviewed. Constitutional: He is oriented to person, place, and time. He appears well-developed and well-nourished.  HENT:  Head: Normocephalic and atraumatic.       Hearing aid in left ear, pharynx clear  Eyes: Conjunctivae and EOM are normal. Pupils are equal, round, and reactive to light.  Neck:  Normal range of motion. Neck supple.  Cardiovascular: Normal pulses.  Tachycardia present.   Pulmonary/Chest: He has decreased breath sounds.  Abdominal: Soft. Normal appearance. There is no tenderness.  Neurological: He is alert and oriented to person, place, and time. He has normal strength. No sensory deficit. GCS eye subscore is 4. GCS verbal subscore is 5. GCS motor subscore is 6.  Skin: Skin is warm, dry and intact.  Psychiatric: He has a normal mood and affect. His speech is normal and behavior is normal.    ED Course  Procedures (including critical care time)  Labs Reviewed - No data to display No results found.   No diagnosis found.  Date: 03/14/2011  Rate: 98  Rhythm: normal sinus rhythm  QRS Axis: normal Intervals: normal   ST/T Wave abnormalities: normal  Conduction Disutrbances:nonspecific intraventricular conduction delay  Narrative Interpretation:   Old EKG Reviewed: changes noted     MDM  Patient with elevated wbc but without left shift.   There is no baseline cbc available for comparison.  Chest ct with scattered pulmonary nodules which require follow up.  Patient with normalsats on room air. Strep screen , neck ct normal.  Plan follow up with pmd on Monday.  Prednisone and albuterol mdi with spacer.     Dg Chest 2 View  03/14/2011  *RADIOLOGY REPORT*  Clinical Data: Shortness of breath.  CHEST - 2 VIEW  Comparison: Plain films of the chest 11/24/2007 and 04/15/2005.  Findings: There is chronic blunting of the right costophrenic angle.  Lungs appear emphysematous but clear.  Heart size is normal.  IMPRESSION: No acute finding.  Stable compared to prior studies.  Original Report Authenticated By: Bernadene Bell. Maricela Curet, M.D.   Ct Soft Tissue Neck W Contrast  03/14/2011  *RADIOLOGY REPORT*  Clinical Data: Difficulty breathing, hoarseness  CT NECK WITH CONTRAST  Technique:  Multidetector CT imaging of the neck was performed with intravenous contrast.  Contrast:  OMNIPAQUE IOHEXOL 300 MG/ML IV SOLN  Comparison: None.  Findings: Patent airway.  No mass is seen.  No suspicious cervical lymphadenopathy.  Visualized brain parenchyma is within normal limits.  Visualized paranasal sinuses and mastoid air cells are clear.  Degenerative changes of the cervical spine at C6-7.  Bilateral thyroid nodules.  Visualized lung apices are essentially clear.  IMPRESSION: Patent airway.  No mass is seen.  Bilateral thyroid nodules.  Original Report Authenticated By: Charline Bills, M.D.   Ct Chest Wo Contrast  03/14/2011  *RADIOLOGY REPORT*  Clinical Data: Sore throat, difficulty breathing, foreign body sensation, elevated WBCs  CT CHEST WITHOUT CONTRAST  Technique:  Multidetector CT imaging of the chest was performed following the standard protocol without IV contrast.  Comparison: Chest radiographs dated 03/14/2011  Findings: Scattered tiny pulmonary nodules bilaterally, including calcified granulomata, the largest noncalcified nodules measuring 4 mm in the subpleural right upper lobe (series 3/image 15), left lower lobe (series 3/image 42), and right lung base (series 3/image 49).  Mild centrilobular emphysematous changes in the upper lobes and mild subpleural reticulation in the lower lobes.  No pleural effusion or pneumothorax.  Visualized thyroid is mildly heterogeneous.  The heart is normal in size.  No pericardial effusion.  Coronary atherosclerosis.  Mild atherosclerotic calcifications of the aortic arch.  No suspicious mediastinal, hilar, or axillary lymphadenopathy.  Visualized upper abdomen is notable for surgical clips in the left upper abdomen.  Mild degenerative changes of the visualized thoracolumbar spine.  IMPRESSION: Scattered tiny pulmonary nodules bilaterally, measuring up to 4 mm. Single follow-up CT chest is suggested in 12 months to document stability, as per Fleischner Society guidelines.  Mild centrilobular emphysematous changes.  Above recommendation  follows the consensus statement: Guidelines for Management of Small Pulmonary Nodules Detected on CT Scans:  A Statement from the Fleischner Society as published in Radiology 2005; 237:395-400.  Available online at: [email or web address].cz.  Original Report Authenticated By: Charline Bills, M.D.    Results for orders placed during the hospital encounter of 03/14/11  CBC      Component Value Range   WBC 29.4 (*) 4.0 - 10.5 (K/uL)   RBC 5.27  4.22 - 5.81 (MIL/uL)   Hemoglobin 15.3  13.0 - 17.0 (g/dL)   HCT 46.9  62.9 - 52.8 (%)   MCV 83.7  78.0 - 100.0 (fL)   MCH 29.0  26.0 - 34.0 (pg)   MCHC 34.7  30.0 - 36.0 (g/dL)   RDW 41.3  24.4 - 01.0 (%)   Platelets 361  150 - 400 (K/uL)  DIFFERENTIAL      Component Value Range   Neutrophils Relative 71  43 - 77 (%)   Lymphocytes Relative 12  12 - 46 (%)   Monocytes Relative 16 (*) 3 - 12 (%)   Eosinophils Relative 1  0 - 5 (%)   Basophils Relative 0  0 - 1 (%)   Neutro Abs  20.9 (*) 1.7 - 7.7 (K/uL)   Lymphs Abs 3.5  0.7 - 4.0 (K/uL)   Monocytes Absolute 4.7 (*) 0.1 - 1.0 (K/uL)   Eosinophils Absolute 0.3  0.0 - 0.7 (K/uL)   Basophils Absolute 0.0  0.0 - 0.1 (K/uL)   Smear Review MORPHOLOGY UNREMARKABLE    COMPREHENSIVE METABOLIC PANEL      Component Value Range   Sodium 129 (*) 135 - 145 (mEq/L)   Potassium 4.3  3.5 - 5.1 (mEq/L)   Chloride 93 (*) 96 - 112 (mEq/L)   CO2 25  19 - 32 (mEq/L)   Glucose, Bld 209 (*) 70 - 99 (mg/dL)   BUN 14  6 - 23 (mg/dL)   Creatinine, Ser 9.60  0.50 - 1.35 (mg/dL)   Calcium 9.7  8.4 - 45.4 (mg/dL)   Total Protein 7.2  6.0 - 8.3 (g/dL)   Albumin 3.3 (*) 3.5 - 5.2 (g/dL)   AST 10  0 - 37 (U/L)   ALT 15  0 - 53 (U/L)   Alkaline Phosphatase 63  39 - 117 (U/L)   Total Bilirubin 0.5  0.3 - 1.2 (mg/dL)   GFR calc non Af Amer 90 (*) >90 (mL/min)   GFR calc Af Amer >90  >90 (mL/min)  RAPID STREP SCREEN      Component Value Range   Streptococcus, Group A Screen (Direct)  NEGATIVE  NEGATIVE          Hilario Quarry, MD 03/14/11 1547

## 2012-03-09 ENCOUNTER — Ambulatory Visit (HOSPITAL_COMMUNITY)
Admission: RE | Admit: 2012-03-09 | Discharge: 2012-03-09 | Disposition: A | Discharge status: Home / Self Care | Payer: Medicare Other | Source: Ambulatory Visit | Attending: Internal Medicine | Admitting: Internal Medicine

## 2012-03-09 ENCOUNTER — Other Ambulatory Visit (HOSPITAL_COMMUNITY): Payer: Self-pay | Admitting: Internal Medicine

## 2012-03-09 DIAGNOSIS — I10 Essential (primary) hypertension: Secondary | ICD-10-CM | POA: Insufficient documentation

## 2012-03-09 DIAGNOSIS — F17200 Nicotine dependence, unspecified, uncomplicated: Secondary | ICD-10-CM | POA: Insufficient documentation

## 2012-03-09 DIAGNOSIS — E119 Type 2 diabetes mellitus without complications: Secondary | ICD-10-CM | POA: Insufficient documentation

## 2012-03-09 DIAGNOSIS — J449 Chronic obstructive pulmonary disease, unspecified: Secondary | ICD-10-CM | POA: Insufficient documentation

## 2012-03-09 DIAGNOSIS — J4489 Other specified chronic obstructive pulmonary disease: Secondary | ICD-10-CM | POA: Insufficient documentation

## 2012-12-06 ENCOUNTER — Other Ambulatory Visit: Payer: Self-pay | Admitting: Internal Medicine

## 2012-12-28 ENCOUNTER — Encounter (INDEPENDENT_AMBULATORY_CARE_PROVIDER_SITE_OTHER): Payer: Self-pay | Admitting: Surgery

## 2012-12-28 ENCOUNTER — Ambulatory Visit (INDEPENDENT_AMBULATORY_CARE_PROVIDER_SITE_OTHER): Payer: Medicare Other | Admitting: Surgery

## 2012-12-28 VITALS — BP 102/70 | HR 68 | Resp 16 | Ht 70.5 in | Wt 201.4 lb

## 2012-12-28 DIAGNOSIS — Z9089 Acquired absence of other organs: Secondary | ICD-10-CM

## 2012-12-28 DIAGNOSIS — C439 Malignant melanoma of skin, unspecified: Secondary | ICD-10-CM | POA: Insufficient documentation

## 2012-12-28 DIAGNOSIS — Z9081 Acquired absence of spleen: Secondary | ICD-10-CM | POA: Insufficient documentation

## 2012-12-28 NOTE — Progress Notes (Signed)
[name] 72 y.o.  Body mass index is 28.48 kg/(m^2).  Patient Active Problem List   Diagnosis Date Noted  . Melanoma of skin anterior abdominal wall 12/28/2012  . History of splenectomy-for hereditary spherocytosis 12/28/2012  . DM 10/03/2008  . DYSLIPIDEMIA 10/03/2008  . OBESITY, UNSPECIFIED 10/03/2008  . COPD 10/03/2008  . SHORTNESS OF BREATH 10/03/2008  . BENIGN PROSTATIC HYPERTROPHY, HX OF 10/03/2008    Allergies  Allergen Reactions  . Latex Itching  . Neomycin-Bacitracin Zn-Polymyx Other (See Comments)    Causes blisters  . Ciprofloxacin Rash    Past Surgical History  Procedure Laterality Date  . Bladder repair    . Cholecystectomy    . Splenectomy, total     MCKEOWN,WILLIAM DAVID, MD 1. Melanoma of skin anterior abdominal wall   2. History of splenectomy-for hereditary spherocytosis     [name] returns today in followup from an area that Dr. Oneta Rack  excised from his left anterior abdominal wall. The wound has healed and I removed his remaining sutures. 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 This represented a conservative excision and with negative margins I felt that no further surgery was indicated. I suggested that he followup with Dr. Oneta Rack for his skin screening. I will be happy to see me again whenever needed. Return when necessary Matt B. Daphine Deutscher, MD, [facility] [phone] beeper [phone]  12/28/2012 10:57 AM

## 2012-12-28 NOTE — Patient Instructions (Addendum)
Thanks for your patience.  If you need further assistance after leaving the office, please call our office and speak with a [facility] nurse.  [phone].  If you want to leave a message for Dr. Daphine Deutscher, please call his office phone at [phone].  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 I don't feel that further surgery is necessary at the present time but followup with Dr. Oneta Rack for routine skin screening

## 2013-03-06 ENCOUNTER — Encounter: Payer: Self-pay | Admitting: Internal Medicine

## 2013-03-07 ENCOUNTER — Encounter: Payer: Self-pay | Admitting: Physician Assistant

## 2013-03-09 ENCOUNTER — Encounter: Payer: Self-pay | Admitting: Internal Medicine

## 2013-03-09 ENCOUNTER — Ambulatory Visit: Payer: Medicare Other | Admitting: Internal Medicine

## 2013-03-09 VITALS — BP 118/60 | HR 80 | Temp 99.7°F | Resp 18 | Ht 68.5 in | Wt 206.4 lb

## 2013-03-09 DIAGNOSIS — Z1212 Encounter for screening for malignant neoplasm of rectum: Secondary | ICD-10-CM

## 2013-03-09 DIAGNOSIS — J449 Chronic obstructive pulmonary disease, unspecified: Secondary | ICD-10-CM

## 2013-03-09 DIAGNOSIS — E119 Type 2 diabetes mellitus without complications: Secondary | ICD-10-CM

## 2013-03-09 DIAGNOSIS — E559 Vitamin D deficiency, unspecified: Secondary | ICD-10-CM

## 2013-03-09 DIAGNOSIS — E785 Hyperlipidemia, unspecified: Secondary | ICD-10-CM

## 2013-03-09 DIAGNOSIS — Z79899 Other long term (current) drug therapy: Secondary | ICD-10-CM

## 2013-03-09 DIAGNOSIS — Z125 Encounter for screening for malignant neoplasm of prostate: Secondary | ICD-10-CM

## 2013-03-09 DIAGNOSIS — I10 Essential (primary) hypertension: Secondary | ICD-10-CM | POA: Insufficient documentation

## 2013-03-09 LAB — CBC WITH DIFFERENTIAL/PLATELET
Basophils Absolute: 0.1 10*3/uL (ref 0.0–0.1)
Eosinophils Absolute: 1 10*3/uL — ABNORMAL HIGH (ref 0.0–0.7)
Eosinophils Relative: 6 % — ABNORMAL HIGH (ref 0–5)
Lymphocytes Relative: 24 % (ref 12–46)
MCV: 85.5 fL (ref 78.0–100.0)
Platelets: 353 10*3/uL (ref 150–400)
RDW: 14.3 % (ref 11.5–15.5)
WBC: 16.1 10*3/uL — ABNORMAL HIGH (ref 4.0–10.5)

## 2013-03-09 LAB — BASIC METABOLIC PANEL WITH GFR
BUN: 19 mg/dL (ref 6–23)
Chloride: 102 mEq/L (ref 96–112)
Glucose, Bld: 123 mg/dL — ABNORMAL HIGH (ref 70–99)

## 2013-03-09 LAB — LIPID PANEL
LDL Cholesterol: 45 mg/dL (ref 0–99)
VLDL: 43 mg/dL — ABNORMAL HIGH (ref 0–40)

## 2013-03-09 LAB — HEPATIC FUNCTION PANEL
ALT: 16 U/L (ref 0–53)
AST: 13 U/L (ref 0–37)
Alkaline Phosphatase: 49 U/L (ref 39–117)
Bilirubin, Direct: 0.1 mg/dL (ref 0.0–0.3)
Indirect Bilirubin: 0.4 mg/dL (ref 0.0–0.9)
Total Protein: 6.8 g/dL (ref 6.0–8.3)

## 2013-03-09 LAB — HEMOGLOBIN A1C
Hgb A1c MFr Bld: 5.9 % — ABNORMAL HIGH (ref <5.7)
Mean Plasma Glucose: 123 mg/dL — ABNORMAL HIGH (ref <117)

## 2013-03-09 MED ORDER — ALPRAZOLAM 1 MG PO TABS: 1.0000 mg | ORAL_TABLET | Freq: Every evening | ORAL | Status: DC | PRN

## 2013-03-09 MED ORDER — CLOBETASOL PROPIONATE 0.05 % EX OINT: TOPICAL_OINTMENT | CUTANEOUS | Status: DC

## 2013-03-09 NOTE — Patient Instructions (Signed)
Continue diet and meds as discussed.   Further disposition pending results of labs.    Diabetes and Exercise Exercising regularly is important. It is not just about losing weight. It has many health benefits, such as:  Improving your overall fitness, flexibility, and endurance.  Increasing your bone density.  Helping with weight control.  Decreasing your body fat.  Increasing your muscle strength.  Reducing stress and tension.  Improving your overall health. People with diabetes who exercise gain additional benefits because exercise:  Reduces appetite.  Improves the body's use of blood sugar (glucose).  Helps lower or control blood glucose.  Decreases blood pressure.  Helps control blood lipids (such as cholesterol and triglycerides).  Improves the body's use of the hormone insulin by:  Increasing the body's insulin sensitivity.  Reducing the body's insulin needs.  Decreases the risk for heart disease because exercising:  Lowers cholesterol and triglycerides levels.  Increases the levels of good cholesterol (such as high-density lipoproteins [HDL]) in the body.  Lowers blood glucose levels. YOUR ACTIVITY PLAN  Choose an activity that you enjoy and set realistic goals. Your health care provider or diabetes educator can help you make an activity plan that works for you. You can break activities into 2 or 3 sessions throughout the day. Doing so is as good as one long session. Exercise ideas include:  Taking the dog for a walk.  Taking the stairs instead of the elevator.  Dancing to your favorite song.  Doing your favorite exercise with a friend. RECOMMENDATIONS FOR EXERCISING WITH TYPE 1 OR TYPE 2 DIABETES   Check your blood glucose before exercising. If blood glucose levels are greater than 240 mg/dL, check for urine ketones. Do not exercise if ketones are present.  Avoid injecting insulin into areas of the body that are going to be exercised. For example,  avoid injecting insulin into:  The arms when playing tennis.  The legs when jogging.  Keep a record of:  Food intake before and after you exercise.  Expected peak times of insulin action.  Blood glucose levels before and after you exercise.  The type and amount of exercise you have done.  Review your records with your health care provider. Your health care provider will help you to develop guidelines for adjusting food intake and insulin amounts before and after exercising.  If you take insulin or oral hypoglycemic agents, watch for signs and symptoms of hypoglycemia. They include:  Dizziness.  Shaking.  Sweating.  Chills.  Confusion.  Drink plenty of water while you exercise to prevent dehydration or heat stroke. Body water is lost during exercise and must be replaced.  Talk to your health care provider before starting an exercise program to make sure it is safe for you. Remember, almost any type of activity is better than none. Document Released: 07/04/2003 Document Revised: 12/14/2012 Document Reviewed: 09/20/2012 [facility] Patient Information 2014 [place], [place].    Cholesterol Cholesterol is a white, waxy, fat-like protein needed by your body in small amounts. The liver makes all the cholesterol you need. It is carried from the liver by the blood through the blood vessels. Deposits (plaque) may build up on blood vessel walls. This makes the arteries narrower and stiffer. Plaque increases the risk for heart attack and stroke. You cannot feel your cholesterol level even if it is very high. The only way to know is by a blood test to check your lipid (fats) levels. Once you know your cholesterol levels, you should keep a  record of the test results. Work with your caregiver to to keep your levels in the desired range. WHAT THE RESULTS MEAN:  Total cholesterol is a rough measure of all the cholesterol in your blood.  LDL is the so-called bad cholesterol. This is the type  that deposits cholesterol in the walls of the arteries. You want this level to be low.  HDL is the good cholesterol because it cleans the arteries and carries the LDL away. You want this level to be high.  Triglycerides are fat that the body can either burn for energy or store. High levels are closely linked to heart disease. DESIRED LEVELS:  Total cholesterol below 200.  LDL below 100 for people at risk, below 70 for very high risk.  HDL above 50 is good, above 60 is best.  Triglycerides below 150. HOW TO LOWER YOUR CHOLESTEROL:  Diet.  Choose fish or white meat chicken and [place], roasted or baked. Limit fatty cuts of red meat, fried foods, and processed meats, such as sausage and lunch meat.  Eat lots of fresh fruits and vegetables. Choose whole grains, beans, pasta, potatoes and cereals.  Use only small amounts of olive, corn or canola oils. Avoid butter, mayonnaise, shortening or palm kernel oils. Avoid foods with trans-fats.  Use skim/nonfat milk and low-fat/nonfat yogurt and cheeses. Avoid whole milk, cream, ice cream, egg yolks and cheeses. Healthy desserts include angel food cake, ginger snaps, animal crackers, hard candy, popsicles, and low-fat/nonfat frozen yogurt. Avoid pastries, cakes, pies and cookies.  Exercise.  A regular program helps decrease LDL and raises HDL.  Helps with weight control.  Do things that increase your activity level like gardening, walking, or taking the stairs.  Medication.  May be prescribed by your caregiver to help lowering cholesterol and the risk for heart disease.  You may need medicine even if your levels are normal if you have several risk factors. HOME CARE INSTRUCTIONS   Follow your diet and exercise programs as suggested by your caregiver.  Take medications as directed.  Have blood work done when your caregiver feels it is necessary. MAKE SURE YOU:   Understand these instructions.  Will watch your condition.  Will get  help right away if you are not doing well or get worse. Document Released: 01/06/2001 Document Revised: 07/06/2011 Document Reviewed: 06/29/2007 [facility] Patient Information 2014 [place], [place].    Hypertension As your heart beats, it forces blood through your arteries. This force is your blood pressure. If the pressure is too high, it is called hypertension (HTN) or high blood pressure. HTN is dangerous because you may have it and not know it. High blood pressure may mean that your heart has to work harder to pump blood. Your arteries may be narrow or stiff. The extra work puts you at risk for heart disease, stroke, and other problems.  Blood pressure consists of two numbers, a higher number over a lower, 110/72, for example. It is stated as "110 over 72." The ideal is below 120 for the top number (systolic) and under 80 for the bottom (diastolic). Write down your blood pressure today. You should pay close attention to your blood pressure if you have certain conditions such as:  Heart failure.  Prior heart attack.  Diabetes  Chronic kidney disease.  Prior stroke.  Multiple risk factors for heart disease. To see if you have HTN, your blood pressure should be measured while you are seated with your arm held at the level of the heart.  It should be measured at least twice. A one-time elevated blood pressure reading (especially in the Emergency Department) does not mean that you need treatment. There may be conditions in which the blood pressure is different between your right and left arms. It is important to see your caregiver soon for a recheck. Most people have essential hypertension which means that there is not a specific cause. This type of high blood pressure may be lowered by changing lifestyle factors such as:  Stress.  Smoking.  Lack of exercise.  Excessive weight.  Drug/tobacco/alcohol use.  Eating less salt. Most people do not have symptoms from high blood pressure until  it has caused damage to the body. Effective treatment can often prevent, delay or reduce that damage. TREATMENT  When a cause has been identified, treatment for high blood pressure is directed at the cause. There are a large number of medications to treat HTN. These fall into several categories, and your caregiver will help you select the medicines that are best for you. Medications may have side effects. You should review side effects with your caregiver. If your blood pressure stays high after you have made lifestyle changes or started on medicines,   Your medication(s) may need to be changed.  Other problems may need to be addressed.  Be certain you understand your prescriptions, and know how and when to take your medicine.  Be sure to follow up with your caregiver within the time frame advised (usually within two weeks) to have your blood pressure rechecked and to review your medications.  If you are taking more than one medicine to lower your blood pressure, make sure you know how and at what times they should be taken. Taking two medicines at the same time can result in blood pressure that is too low. SEEK IMMEDIATE MEDICAL CARE IF:  You develop a severe headache, blurred or changing vision, or confusion.  You have unusual weakness or numbness, or a faint feeling.  You have severe chest or abdominal pain, vomiting, or breathing problems. MAKE SURE YOU:   Understand these instructions.  Will watch your condition.  Will get help right away if you are not doing well or get worse. Document Released: 04/13/2005 Document Revised: 07/06/2011 Document Reviewed: 12/02/2007 [facility] Patient Information 2014 [place], [place].

## 2013-03-09 NOTE — Progress Notes (Signed)
Patient ID: [name], male   DOB: Apr 15, 1941, 72 y.o.   MRN: [id number]   HPI +Very nice 72 yo SWM presents for complete physical.  Patient has long standing HTN which has been controlled at home. Patient denies any cardiac symptoms as chest pain, palpitations, dizziness or ankle swelling.  Patient's hyperlipidemia is controlled with diet and medications.  Last cholesterol last visit was 129 , triglycerides sl. Up at 186, HDL low at 28, and LDL 64 at goal. Patient denies myalgias or other medication SE's.  Patient has NIDDM with  A1c 6.3% in February improving to 5.5% in may. He reports CBG's usually run below 140 mg%. Patient denies reactive hypoglycemic symptoms, diabetic polys, or paresthesias.  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 Other limiting factor is severe hearing loss compensated with bilat. hearing aides.  Patient has moderately severe COPD admitting Dyspnea with moderate exertion, but no chest pain or discomfort. He reports daily non-productive cough and no hemoptysis. Fortunately he quit smoking in 2002.  Other problems include Vitamin D Deficiency for which patient is supplementing Vitamin D.  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      Medication List       ALPRAZolam 1 MG tablet  Commonly known as:  XANAX  Take 1 tablet (1 mg total) by mouth at bedtime as needed for anxiety.     aspirin EC 81 MG tablet  Take 81 mg by mouth daily.     atorvastatin 10 MG tablet  Commonly known as:  LIPITOR  Take 10 mg by mouth daily.     benazepril 40 MG tablet  Commonly known as:  LOTENSIN  Take 40 mg by mouth daily.     calcium-vitamin D 500-200 MG-UNIT per tablet  Commonly known as:  OSCAL WITH D  Take 1 tablet by mouth daily.     clobetasol ointment 0.05 %  Commonly known as:  TEMOVATE  Apply to eczema rash 2-3 x/da     Flaxseed Oil 1000 MG Caps  Take 1,000 mg by mouth daily.     guaiFENesin 600 MG 12 hr tablet  Commonly known as:  MUCINEX  Take 1,200 mg by mouth 2 (two) times daily.     HYDROcodone-acetaminophen 5-500 MG per tablet  Commonly known as:  VICODIN  Take 1 tablet by mouth every 6 (six) hours as needed.     metFORMIN 500 MG tablet  Commonly known as:  GLUCOPHAGE  Take 500 mg by mouth 2 (two) times daily with a meal.     omega-3 acid ethyl esters 1 G capsule  Commonly known as:  LOVAZA  Take 1 g by mouth daily.     tiotropium 18 MCG inhalation capsule  Commonly known as:  SPIRIVA  Place 18 mcg into inhaler and inhale daily.        Health Maintenance  Topic Date Due  . Colonoscopy  05/08/1988  . Zostavax  05/08/1998  . Pneumococcal Polysaccharide Vaccine Age 32 And Over  05/09/2003  . Tetanus/tdap  04/27/2012  . Influenza Vaccine  11/25/2012    Allergies  Allergen Reactions  . Crestor [Rosuvastatin]   . Latex Itching  . Neomycin-Bacitracin Zn-Polymyx Other (See Comments)    Causes blisters  . Ciprofloxacin  Rash    Past Medical History  Diagnosis Date  . COPD (chronic obstructive pulmonary disease)   . Emphysema   . Hypertension   . Diabetes mellitus    BPH - Dr Patsi Sears    Melanoma in situ - Lt lower abdomen (11/2012)   . Hypercholesteremia     Past Surgical History  Procedure Laterality Date  . Bladder repair    . Cholecystectomy    . Splenectomy, total      Family History  Problem Relation Age of Onset  . Heart disease Mother   . Heart disease Father     History   Social History  . Marital Status: Divorced    Spouse Name: N/A    Number of Children: 1 daughter N/A  . Years of Education: N/A   Occupational History  . Not on file.   Social History Main Topics  . Smoking status:90 pk yrs Quit 2002. Former Smoker    Types: Cigarettes  . Smokeless  tobacco: Not on file  . Alcohol Use: No  . Drug Use: No  . Sexual Activity: Not on file    SYSTEMS REVIEW Constitutional: Denies fever, chills, weight loss/gain, headaches, insomnia, fatigue, night sweats, and change in appetite. Eyes: Denies redness, blurred vision, diplopia, discharge, itchy, watery eyes.  ENT: Denies discharge, congestion, post nasal drip, epistaxis, sore throat, earache, hearing loss, dental pain, Tinnitus, Vertigo, Sinus pain, snoring.  Cardio: Denies chest pain, palpitations, irregular heartbeat, syncope, dyspnea, diaphoresis, orthopnea, PND, claudication, edema Respiratory: denies cough, dyspnea, DOE, pleurisy, hoarseness, laryngitis, wheezing.  Gastrointestinal: Denies dysphagia, heartburn, reflux, water brash, pain, cramps, nausea, vomiting, bloating, diarrhea, constipation, hematemesis, melena, hematochezia, jaundice, hemorrhoids Genitourinary: Denies dysuria, frequency, urgency, nocturia, hesitancy, discharge, hematuria, flank pain Musculoskeletal: Denies arthralgia, myalgia, stiffness, Jt. Swelling, pain, limp, and strain/sprain. Skin: Denies puritis, rash, hives, warts, acne, eczema, changing in skin lesion Neuro: Weakness, tremor, incoordination, spasms, paresthesia, pain Psychiatric: Denies confusion, memory loss, sensory loss Endocrine: Denies change in weight, skin, hair change, nocturia, and paresthesia, Diabetic Polys, visual blurring, hyper /hypo glycemic episodes.  Heme/Lymph: Excessive bleeding, bruising, enlarged lymph node   Physical Exam: Filed Vitals:   03/09/13 1558  BP: 118/60  Pulse: 80  Temp: 99.7 F (37.6 C)  Resp: 18   Body mass index is 30.92 kg/(m^2). General Appearance: Well nourished, in no apparent distress. Eyes: PERRLA, EOMs, conjunctiva no swelling or erythema. Mild to moderate lenticular clouding. Bilat. retinal scaring from laser tx's. Sinuses: No Frontal/maxillary tenderness ENT/Mouth: EACs Patent / TMs  nl. Nares clear  without erythema, swelling, mucoid exudates. Good oral hygiene. No erythema, swelling, or exudate on post pharynx. Tongue normal, non-obstructing. Tonsils not swollen or erythematous. Hearing normal.  Neck: Supple, thyroid normal. No bruits or JVD. Respiratory: Respiratory effort normal.  BS equal bilaterally without rales, rhonci, wheezing or stridor. Cardio: Heart sounds normal, regular rate and rhythm without murmurs, rubs or gallops. Peripheral pulses brisk and equal bilaterally, without edema. No aortic or femoral bruits. Chest: Mild Increased Diameter - barrel configuration. Symmetric with Distant , clear equal BS. Abdomen: Flat, soft, with bowl sounds. Nontender, no guarding, rebound, hernias, masses, or organomegaly.  Lymphatics: Non tender without lymphadenopathy.  Genitourinary: Deferred to Dr Patsi Sears earlier this year. Musculoskeletal: Full ROM all peripheral extremities, joint stability, 5/5 strength, and normal gait. Skin: Warm, dry without rashes, lesions, ecchymosis. 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 Abdomen around recent excisional bx site. No palpable nodularity or ulceration evident. Neuro: Cranial nerves intact, reflexes equal bilaterally.  Normal muscle tone, no cerebellar symptoms. Sensation intact.  Pysch: Awake and oriented X 3, normal affect, Insight and Judgment appropriate.   Assessment and Plan  1.Hypertension - continue diet, exercise and meds as discussed  2. Hyperlipidemia - continue low cholesterol diet, exercise and meds as discussed  3. Prediabetes/Insulin Resistance - diet, exercise, weight loss as discussed  4. Vitamin D Deficiency - Supplement as discussed  Try clobetasol ung. To rash on abdomen.   Plan routine screening labs, EKG, hemoccult, Aortoscan

## 2013-03-10 ENCOUNTER — Other Ambulatory Visit: Payer: Self-pay | Admitting: Internal Medicine

## 2013-03-10 LAB — MICROALBUMIN / CREATININE URINE RATIO
Creatinine, Urine: 102.8 mg/dL
Microalb Creat Ratio: 263.6 mg/g — ABNORMAL HIGH (ref 0.0–30.0)
Microalb, Ur: 27.1 mg/dL — ABNORMAL HIGH (ref 0.00–1.89)

## 2013-03-10 LAB — INSULIN, FASTING: Insulin fasting, serum: 42 u[IU]/mL — ABNORMAL HIGH (ref 3–28)

## 2013-03-10 LAB — URINALYSIS, MICROSCOPIC ONLY
Bacteria, UA: NONE SEEN
Crystals: NONE SEEN

## 2013-03-10 LAB — VITAMIN D 25 HYDROXY (VIT D DEFICIENCY, FRACTURES): Vit D, 25-Hydroxy: 34 ng/mL (ref 30–89)

## 2013-03-10 MED ORDER — DOXYCYCLINE HYCLATE 100 MG PO CAPS: ORAL_CAPSULE | ORAL | Status: DC

## 2013-03-15 ENCOUNTER — Ambulatory Visit (HOSPITAL_COMMUNITY)
Admission: RE | Admit: 2013-03-15 | Discharge: 2013-03-15 | Disposition: A | Discharge status: Home / Self Care | Payer: Medicare Other | Source: Ambulatory Visit | Attending: Internal Medicine | Admitting: Internal Medicine

## 2013-03-15 DIAGNOSIS — J4489 Other specified chronic obstructive pulmonary disease: Secondary | ICD-10-CM | POA: Insufficient documentation

## 2013-03-15 DIAGNOSIS — Z87891 Personal history of nicotine dependence: Secondary | ICD-10-CM | POA: Insufficient documentation

## 2013-03-15 DIAGNOSIS — J449 Chronic obstructive pulmonary disease, unspecified: Secondary | ICD-10-CM

## 2013-03-15 DIAGNOSIS — J984 Other disorders of lung: Secondary | ICD-10-CM | POA: Insufficient documentation

## 2013-03-16 ENCOUNTER — Other Ambulatory Visit: Payer: Self-pay | Admitting: Internal Medicine

## 2013-03-16 DIAGNOSIS — R918 Other nonspecific abnormal finding of lung field: Secondary | ICD-10-CM

## 2013-03-17 ENCOUNTER — Telehealth: Payer: Self-pay

## 2013-03-17 NOTE — Telephone Encounter (Signed)
Pt refuses appt for CT Chest. He stated that he isn't concerned about the possible mass on R lung. He also stated that even if there was an issue w/ his lungs, he wasn't going to treat issue. Told pt to call back if he decides he wants to have CT performed.

## 2013-03-21 ENCOUNTER — Ambulatory Visit
Admission: RE | Admit: 2013-03-21 | Discharge: 2013-03-21 | Disposition: A | Discharge status: Home / Self Care | Payer: Medicare Other | Source: Ambulatory Visit | Attending: Internal Medicine | Admitting: Internal Medicine

## 2013-03-21 ENCOUNTER — Other Ambulatory Visit: Payer: Self-pay | Admitting: Internal Medicine

## 2013-03-21 DIAGNOSIS — R911 Solitary pulmonary nodule: Secondary | ICD-10-CM | POA: Insufficient documentation

## 2013-03-21 DIAGNOSIS — R918 Other nonspecific abnormal finding of lung field: Secondary | ICD-10-CM | POA: Insufficient documentation

## 2013-03-21 DIAGNOSIS — R9389 Abnormal findings on diagnostic imaging of other specified body structures: Secondary | ICD-10-CM

## 2013-03-21 MED ORDER — IOHEXOL 300 MG/ML  SOLN
75.0000 mL | Freq: Once | INTRAMUSCULAR | Status: AC | PRN
  Administered 2013-03-21: 75 mL via INTRAVENOUS

## 2013-03-27 ENCOUNTER — Other Ambulatory Visit: Payer: Self-pay | Admitting: Internal Medicine

## 2013-03-27 ENCOUNTER — Institutional Professional Consult (permissible substitution): Payer: Medicare Other | Admitting: Internal Medicine

## 2013-03-27 MED ORDER — BENAZEPRIL HCL 40 MG PO TABS: 40.0000 mg | ORAL_TABLET | Freq: Every day | ORAL | Status: DC

## 2013-03-27 MED ORDER — METFORMIN HCL 500 MG PO TABS: 500.0000 mg | ORAL_TABLET | Freq: Two times a day (BID) | ORAL | Status: DC

## 2013-04-06 ENCOUNTER — Encounter: Payer: Self-pay | Admitting: Internal Medicine

## 2013-04-06 ENCOUNTER — Ambulatory Visit (INDEPENDENT_AMBULATORY_CARE_PROVIDER_SITE_OTHER): Payer: Medicare Other | Admitting: Internal Medicine

## 2013-04-06 VITALS — BP 122/80 | HR 87 | Ht 70.0 in | Wt 205.6 lb

## 2013-04-06 DIAGNOSIS — J449 Chronic obstructive pulmonary disease, unspecified: Secondary | ICD-10-CM

## 2013-04-06 DIAGNOSIS — R911 Solitary pulmonary nodule: Secondary | ICD-10-CM

## 2013-04-06 NOTE — Patient Instructions (Addendum)
#  Right lung nodule YOu have 1cm Right Upper Lobe Lung nodule THis might or might not be lung cancer I respect your decision to refuse all testing to figure out what it is  Please return in  2 months so we can revisit our conversation  #Shortness of breath  - at followup do spirometry in ofice and walk test

## 2013-04-06 NOTE — Progress Notes (Signed)
Subjective:    Patient ID: [name], male    DOB: 1941-02-20, 72 y.o.   MRN: [id number] PCP Nadean Corwin, MD   HPI  IOV 04/06/2013   72 year old male referred for new-onset pulmonary nodule. According to the patient and his sister he has chronic stable dyspnea on exertion for the last several years. Primary care physician in the chart diagnosis of COPD. This is stable and assisted with chronic mucus production and cough. Both of which is rated as mild to moderate. Dyspnea is relieved by rest.  The best I can gather from her day history is that patient had a routine chest x-ray as part of physical that showed a pulmonary nodule which resulted in a CT scan of the chest in December 2014. The CT scan of the chest shows a 1 cm right upper lobe lung nodule associated with surrounding inflammatory micronodular. These findings are new compared to a CT scan of the chest in 2012. The large nodule itself is not spiculated are calcified. Looks indeterminate in nature. It is not associated with hemoptysis or chest pain Colored mucus or recent fever.  Patient is categorically adamant that he does not want further workup including PET scan of serum biomarkers for lung cancer or surgical lung biopsy or bronchoscopy lung biopsy. He is very categorical that he would not want lobectomy or radiation therapy for stage I non-small cell lung cancer if ultimately that's what this is. In fact he does not even want etiological workup      CONTRAST: 75mL OMNIPAQUE IOHEXOL 300 MG/ML SOLN  COMPARISON: Chest x-ray of 03/15/2013 and 03/09/2012  FINDINGS:  There is an oval nodular lesion within the anterior right upper lobe  of 10 x 14 x 12 mm. This lesion could be within the bronchus, and  there is peribronchial vascular nodularity peripheral to this lesion  within the anterior right upper lobe medially. Although this could  be inflammatory in nature, a malignancy cannot be excluded. Slight   peribronchial vascular nodularity is also noted in the right middle  lobe and right lower lobe, and an atypical infection such as  mycobacterium avium is a consideration. Slight dilatation of bronchi  is noted in the right lower lobe with some linear scarring present.  Small nodules are noted, with the largest in the left lower lobe  posteriorly being subpleural in location measuring 4 mm in diameter  on image number 46. A 3 mm nodule is noted anteriorly and laterally  within the left lower lobe as well. The central airway is patent.  On soft tissue window images, small thyroid nodules are present of  doubtful significance in this age patient, possibly indicating mild  multinodular goiter. Clinical correlation is recommended. The  thoracic aorta opacifies with no significant abnormality and the  origins of the great vessels are patent. Atheromatous change is  noted within the aortic arch. The pulmonary arteries opacify with no  significant abnormality noted. Small mediastinal lymph nodes are  present, none of which are pathologically enlarged. Small right  upper pole renal cysts are present. There are diffuse degenerative  changes throughout the thoracic spine. A probable pleural plaque is  present at the left lung base. No pleural effusion is seen. A small  calcified granuloma is noted in the right lower lobe and within the  right lung apex suggesting prior granulomatous disease.  IMPRESSION:  1. 10 x 14 x 12 mm oval nodular lesion in the right upper lobe  anteriorly. This may  be inflammatory in nature, but a malignancy  cannot be excluded. A thoracic surgery consult may be helpful.  2. Small nodules are present in the right upper lobe and right lower  lobe, and there are a few calcified granulomas present as well  possibly indicating prior granulomatous disease.  3. Some peribronchial vascular nodularity particularly within the  right upper lobe peripheral to the nodular lesion as  well as within  the right middle lobe and right lower lobe suggesting a possible  atypical infection such as mycobacterium avium. Clinical correlation  is recommended.  Electronically Signed  By: Dwyane Dee M.D.  On: 03/21/2013 10:00       Past Medical History  Diagnosis Date  . COPD (chronic obstructive pulmonary disease)   . Emphysema   . Hypertension   . Diabetes mellitus   . Hypercholesteremia      Family History  Problem Relation Age of Onset  . Heart disease Mother   . Heart disease Father      History   Social History  . Marital Status: Divorced    Spouse Name: N/A    Number of Children: N/A  . Years of Education: N/A   Occupational History  . Not on file.   Social History Main Topics  . Smoking status: Former Smoker -- 3.00 packs/day for 45 years    Types: Cigarettes    Quit date: 04/27/1994  . Smokeless tobacco: Not on file  . Alcohol Use: No  . Drug Use: No  . Sexual Activity: Not on file   Other Topics Concern  . Not on file   Social History Narrative  . No narrative on file     Allergies  Allergen Reactions  . Crestor [Rosuvastatin]   . Latex Itching  . Neomycin-Bacitracin Zn-Polymyx Other (See Comments)    Causes blisters  . Ciprofloxacin Rash     Outpatient Prescriptions Prior to Visit  Medication Sig Dispense Refill  . aspirin EC 81 MG tablet Take 81 mg by mouth daily.        Marland Kitchen atorvastatin (LIPITOR) 10 MG tablet Take 10 mg by mouth daily.        . benazepril (LOTENSIN) 40 MG tablet Take 1 tablet (40 mg total) by mouth daily.  90 tablet  3  . calcium-vitamin D (OSCAL WITH D) 500-200 MG-UNIT per tablet Take 1 tablet by mouth daily.        . clobetasol ointment (TEMOVATE) 0.05 % Apply to eczema rash 2-3 x/da  30 g  99  . Flaxseed, Linseed, (FLAXSEED OIL) 1000 MG CAPS Take 1,000 mg by mouth daily.      Marland Kitchen guaiFENesin (MUCINEX) 600 MG 12 hr tablet Take 1,200 mg by mouth 2 (two) times daily.        Marland Kitchen HYDROcodone-acetaminophen (VICODIN)  5-500 MG per tablet Take 1 tablet by mouth every 6 (six) hours as needed.        . metFORMIN (GLUCOPHAGE) 500 MG tablet Take 1 tablet (500 mg total) by mouth 2 (two) times daily with a meal.  120 tablet  3  . omega-3 acid ethyl esters (LOVAZA) 1 G capsule Take 1 g by mouth daily.        Marland Kitchen tiotropium (SPIRIVA) 18 MCG inhalation capsule Place 18 mcg into inhaler and inhale daily.       Marland Kitchen ALPRAZolam (XANAX) 1 MG tablet Take 1 tablet (1 mg total) by mouth at bedtime as needed for anxiety.  30 tablet  5  .  doxycycline (VIBRAMYCIN) 100 MG capsule 1 cap BID pc x 5 da then 1 cap QD pc x 10 da for infection  20 capsule  0   No facility-administered medications prior to visit.       Review of Systems  Constitutional: Negative for fever and unexpected weight change.  HENT: Negative for congestion, dental problem, ear pain, nosebleeds, postnasal drip, rhinorrhea, sinus pressure, sneezing, sore throat and trouble swallowing.   Eyes: Negative for redness and itching.  Respiratory: Positive for cough and shortness of breath. Negative for chest tightness and wheezing.   Cardiovascular: Negative for palpitations and leg swelling.  Gastrointestinal: Negative for nausea and vomiting.  Genitourinary: Negative for dysuria.  Musculoskeletal: Negative for joint swelling.  Skin: Negative for rash.  Neurological: Negative for headaches.  Hematological: Does not bruise/bleed easily.  Psychiatric/Behavioral: Negative for dysphoric mood. The patient is not nervous/anxious.        Objective:   Physical Exam  Nursing note and vitals reviewed. Constitutional: He is oriented to person, place, and time. He appears well-developed and well-nourished. No distress.  HENT:  Head: Normocephalic and atraumatic.  Right Ear: External ear normal.  Left Ear: External ear normal.  Mouth/Throat: Oropharynx is clear and moist. No oropharyngeal exudate.  HOH Reported macular degeneration  Eyes: Conjunctivae and EOM are  normal. Pupils are equal, round, and reactive to light. Right eye exhibits no discharge. Left eye exhibits no discharge. No scleral icterus.  Neck: Normal range of motion. Neck supple. No JVD present. No tracheal deviation present. No thyromegaly present.  Cardiovascular: Normal rate, regular rhythm and intact distal pulses.  Exam reveals no gallop and no friction rub.   No murmur heard. Pulmonary/Chest: Effort normal and breath sounds normal. No respiratory distress. He has no wheezes. He has no rales. He exhibits no tenderness.  Abdominal: Soft. Bowel sounds are normal. He exhibits no distension and no mass. There is no tenderness. There is no rebound and no guarding.  Musculoskeletal: Normal range of motion. He exhibits no edema and no tenderness.  Mildly kyphtoc  Lymphadenopathy:    He has no cervical adenopathy.  Neurological: He is alert and oriented to person, place, and time. He has normal reflexes. No cranial nerve deficit. Coordination normal.  Skin: Skin is warm and dry. No rash noted. He is not diaphoretic. No erythema. No pallor.  Psychiatric: He has a normal mood and affect. His behavior is normal. Judgment and thought content normal.          Assessment & Plan:

## 2013-04-06 NOTE — Assessment & Plan Note (Signed)
  Patient is categorically adamant that he does not want further workup including PET scan of serum biomarkers for lung cancer or surgical lung biopsy or bronchoscopy lung biopsy. He is very categorical that he would not want lobectomy or radiation therapy for stage I non-small cell lung cancer if ultimately that's what this is. In fact he does not even want etiological workup   I could not convince him otherweise including siste.r His rationale was that he is ready to die soon (lives alone)  PLAN rov 2 months to revisit his conversation

## 2013-04-06 NOTE — Assessment & Plan Note (Signed)
Check spirometry at fu

## 2013-05-03 ENCOUNTER — Other Ambulatory Visit: Payer: Self-pay | Admitting: Internal Medicine

## 2013-06-08 ENCOUNTER — Ambulatory Visit: Payer: Medicare Other | Admitting: Internal Medicine

## 2013-06-08 ENCOUNTER — Other Ambulatory Visit: Payer: Self-pay | Admitting: Internal Medicine

## 2013-06-13 ENCOUNTER — Ambulatory Visit: Payer: Self-pay | Admitting: Emergency Medicine

## 2013-06-22 ENCOUNTER — Ambulatory Visit: Payer: Self-pay | Admitting: Emergency Medicine

## 2013-07-11 ENCOUNTER — Encounter: Payer: Self-pay | Admitting: Physician Assistant

## 2013-07-18 ENCOUNTER — Inpatient Hospital Stay: Payer: Self-pay | Admitting: Internal Medicine

## 2013-07-18 LAB — CBC
HCT: 47 % (ref 40.0–52.0)
HGB: 15.9 g/dL (ref 13.0–18.0)
MCH: 28.5 pg (ref 26.0–34.0)
MCHC: 33.8 g/dL (ref 32.0–36.0)
MCV: 84 fL (ref 80–100)
Platelet: 409 10*3/uL (ref 150–440)
RBC: 5.58 10*6/uL (ref 4.40–5.90)
RDW: 14.3 % (ref 11.5–14.5)
WBC: 19.7 10*3/uL — ABNORMAL HIGH (ref 3.8–10.6)

## 2013-07-18 LAB — COMPREHENSIVE METABOLIC PANEL
ALK PHOS: 75 U/L
ALT: 26 U/L (ref 12–78)
AST: 20 U/L (ref 15–37)
Albumin: 4 g/dL (ref 3.4–5.0)
Anion Gap: 3 — ABNORMAL LOW (ref 7–16)
BILIRUBIN TOTAL: 0.6 mg/dL (ref 0.2–1.0)
BUN: 13 mg/dL (ref 7–18)
Calcium, Total: 8.8 mg/dL (ref 8.5–10.1)
Chloride: 102 mmol/L (ref 98–107)
Co2: 32 mmol/L (ref 21–32)
Creatinine: 0.93 mg/dL (ref 0.60–1.30)
EGFR (African American): >60
EGFR (Non-African Amer.): >60
Glucose: 109 mg/dL — ABNORMAL HIGH (ref 65–99)
Osmolality: 275 (ref 275–301)
Potassium: 4.4 mmol/L (ref 3.5–5.1)
SODIUM: 137 mmol/L (ref 136–145)
Total Protein: 8.1 g/dL (ref 6.4–8.2)

## 2013-07-18 LAB — CK TOTAL AND CKMB (NOT AT ARMC)
CK, TOTAL: 25 U/L — AB
CK-MB: 1.3 ng/mL (ref 0.5–3.6)

## 2013-07-18 LAB — TROPONIN I: TROPONIN-I: 0.05 ng/mL

## 2013-07-18 LAB — PRO B NATRIURETIC PEPTIDE: B-Type Natriuretic Peptide: 1676 pg/mL — ABNORMAL HIGH (ref 0–450)

## 2013-07-19 LAB — CBC WITH DIFFERENTIAL/PLATELET
Basophil #: 0.1 10*3/uL (ref 0.0–0.1)
Basophil %: 0.5 %
Eosinophil #: 0 10*3/uL (ref 0.0–0.7)
Eosinophil %: 0 %
HCT: 44 % (ref 40.0–52.0)
HGB: 15.1 g/dL (ref 13.0–18.0)
LYMPHS ABS: 1.8 10*3/uL (ref 1.0–3.6)
Lymphocyte %: 9.4 %
MCH: 29.1 pg (ref 26.0–34.0)
MCHC: 34.2 g/dL (ref 32.0–36.0)
MCV: 85 fL (ref 80–100)
MONO ABS: 0.5 x10 3/mm (ref 0.2–1.0)
Monocyte %: 2.7 %
NEUTROS ABS: 16.8 10*3/uL — AB (ref 1.4–6.5)
NEUTROS PCT: 87.4 %
Platelet: 377 10*3/uL (ref 150–440)
RBC: 5.19 10*6/uL (ref 4.40–5.90)
RDW: 14 % (ref 11.5–14.5)
WBC: 19.2 10*3/uL — AB (ref 3.8–10.6)

## 2013-07-19 LAB — BASIC METABOLIC PANEL
Anion Gap: 4 — ABNORMAL LOW (ref 7–16)
BUN: 16 mg/dL (ref 7–18)
CO2: 31 mmol/L (ref 21–32)
CREATININE: 1.02 mg/dL (ref 0.60–1.30)
Calcium, Total: 8.7 mg/dL (ref 8.5–10.1)
Chloride: 99 mmol/L (ref 98–107)
EGFR (African American): >60
Glucose: 189 mg/dL — ABNORMAL HIGH (ref 65–99)
Osmolality: 274 (ref 275–301)
POTASSIUM: 4.4 mmol/L (ref 3.5–5.1)
SODIUM: 134 mmol/L — AB (ref 136–145)

## 2013-07-19 LAB — HEMOGLOBIN A1C: Hemoglobin A1C: 6.5 % — ABNORMAL HIGH (ref 4.2–6.3)

## 2013-07-23 LAB — CULTURE, BLOOD (SINGLE)

## 2013-08-10 ENCOUNTER — Encounter: Payer: Self-pay | Admitting: Internal Medicine

## 2013-08-10 ENCOUNTER — Encounter (INDEPENDENT_AMBULATORY_CARE_PROVIDER_SITE_OTHER): Payer: Medicare Other | Admitting: Internal Medicine

## 2013-08-11 ENCOUNTER — Encounter: Payer: Self-pay | Admitting: Internal Medicine

## 2013-08-11 NOTE — Progress Notes (Signed)
Patient ID: [name], male   DOB: 03-Jul-1940, 73 y.o.   MRN: [id number]    Patient left w/o being seen

## 2013-08-14 ENCOUNTER — Encounter: Payer: Self-pay | Admitting: Internal Medicine

## 2013-08-14 ENCOUNTER — Ambulatory Visit (INDEPENDENT_AMBULATORY_CARE_PROVIDER_SITE_OTHER): Payer: Medicare Other | Admitting: Internal Medicine

## 2013-08-14 VITALS — BP 126/68 | HR 88 | Temp 99.0°F | Resp 16 | Ht 70.0 in | Wt 202.4 lb

## 2013-08-14 DIAGNOSIS — E669 Obesity, unspecified: Secondary | ICD-10-CM

## 2013-08-14 DIAGNOSIS — E785 Hyperlipidemia, unspecified: Secondary | ICD-10-CM

## 2013-08-14 DIAGNOSIS — E119 Type 2 diabetes mellitus without complications: Secondary | ICD-10-CM

## 2013-08-14 DIAGNOSIS — Z79899 Other long term (current) drug therapy: Secondary | ICD-10-CM

## 2013-08-14 DIAGNOSIS — I10 Essential (primary) hypertension: Secondary | ICD-10-CM

## 2013-08-14 DIAGNOSIS — J449 Chronic obstructive pulmonary disease, unspecified: Secondary | ICD-10-CM

## 2013-08-14 DIAGNOSIS — E559 Vitamin D deficiency, unspecified: Secondary | ICD-10-CM

## 2013-08-14 DIAGNOSIS — Z87898 Personal history of other specified conditions: Secondary | ICD-10-CM

## 2013-08-14 DIAGNOSIS — C439 Malignant melanoma of skin, unspecified: Secondary | ICD-10-CM

## 2013-08-14 NOTE — Patient Instructions (Signed)
Chronic Obstructive Pulmonary Disease Chronic obstructive pulmonary disease (COPD) is a common lung condition in which airflow from the lungs is limited. COPD is a general term that can be used to describe many different lung problems that limit airflow, including both chronic bronchitis and emphysema. If you have COPD, your lung function will probably never return to normal, but there are measures you can take to improve lung function and make yourself feel better.  CAUSES   Smoking (common).   Exposure to secondhand smoke.   Genetic problems.  Chronic inflammatory lung diseases or recurrent infections. SYMPTOMS   Shortness of breath, especially with physical activity.   Deep, persistent (chronic) cough with a large amount of thick mucus.   Wheezing.   Rapid breaths (tachypnea).   Gray or bluish discoloration (cyanosis) of the skin, especially in fingers, toes, or lips.   Fatigue.   Weight loss.   Frequent infections or episodes when breathing symptoms become much worse (exacerbations).   Chest tightness. DIAGNOSIS  Your healthcare provider will take a medical history and perform a physical examination to make the initial diagnosis. Additional tests for COPD may include:   Lung (pulmonary) function tests.  Chest X-ray.  CT scan.  Blood tests. TREATMENT  Treatment available to help you feel better when you have COPD include:   Inhaler and nebulizer medicines. These help manage the symptoms of COPD and make your breathing more comfortable  Supplemental oxygen. Supplemental oxygen is only helpful if you have a low oxygen level in your blood.   Exercise and physical activity. These are beneficial for nearly all people with COPD. Some people may also benefit from a pulmonary rehabilitation program. HOME CARE INSTRUCTIONS   Take all medicines (inhaled or pills) as directed by your health care provider.  Only take over-the-counter or prescription medicines  for pain, fever, or discomfort as directed by your health care provider.   Avoid over-the-counter medicines or cough syrups that dry up your airway (such as antihistamines) and slow down the elimination of secretions unless instructed otherwise by your healthcare provider.   If you are a smoker, the most important thing that you can do is stop smoking. Continuing to smoke will cause further lung damage and breathing trouble. Ask your health care provider for help with quitting smoking. He or she can direct you to community resources or hospitals that provide support.  Avoid exposure to irritants such as smoke, chemicals, and fumes that aggravate your breathing.  Use oxygen therapy and pulmonary rehabilitation if directed by your health care provider. If you require home oxygen therapy, ask your healthcare provider whether you should purchase a pulse oximeter to measure your oxygen level at home.   Avoid contact with individuals who have a contagious illness.  Avoid extreme temperature and humidity changes.  Eat healthy foods. Eating smaller, more frequent meals and resting before meals may help you maintain your strength.  Stay active, but balance activity with periods of rest. Exercise and physical activity will help you maintain your ability to do things you want to do.  Preventing infection and hospitalization is very important when you have COPD. Make sure to receive all the vaccines your health care provider recommends, especially the pneumococcal and influenza vaccines. Ask your healthcare provider whether you need a pneumonia vaccine.  Learn and use relaxation techniques to manage stress.  Learn and use controlled breathing techniques as directed by your health care provider. Controlled breathing techniques include:   Pursed lip breathing. Start by breathing   in (inhaling) through your nose for 1 second. Then, purse your lips as if you were going to whistle and breathe out (exhale)  through the pursed lips for 2 seconds.   Diaphragmatic breathing. Start by putting one hand on your abdomen just above your waist. Inhale slowly through your nose. The hand on your abdomen should move out. Then purse your lips and exhale slowly. You should be able to feel the hand on your abdomen moving in as you exhale.   Learn and use controlled coughing to clear mucus from your lungs. Controlled coughing is a series of short, progressive coughs. The steps of controlled coughing are:  1. Lean your head slightly forward.  2. Breathe in deeply using diaphragmatic breathing.  3. Try to hold your breath for 3 seconds.  4. Keep your mouth slightly open while coughing twice.  5. Spit any mucus out into a tissue.  6. Rest and repeat the steps once or twice as needed. SEEK MEDICAL CARE IF:   You are coughing up more mucus than usual.   There is a change in the color or thickness of your mucus.   Your breathing is more labored than usual.   Your breathing is faster than usual.  SEEK IMMEDIATE MEDICAL CARE IF:   You have shortness of breath while you are resting.   You have shortness of breath that prevents you from:  Being able to talk.   Performing your usual physical activities.   You have chest pain lasting longer than 5 minutes.   Your skin color is more cyanotic than usual.  You measure low oxygen saturations for longer than 5 minutes with a pulse oximeter. MAKE SURE YOU:   Understand these instructions.  Will watch your condition.  Will get help right away if you are not doing well or get worse. Document Released: 01/21/2005 Document Revised: 02/01/2013 Document Reviewed: 12/08/2012 [employer] Patient Information 2014 [employer]   Hypertension As your heart beats, it forces blood through your arteries. This force is your blood pressure. If the pressure is too high, it is called hypertension (HTN) or high blood pressure. HTN is dangerous because you  may have it and not know it. High blood pressure may mean that your heart has to work harder to pump blood. Your arteries may be narrow or stiff. The extra work puts you at risk for heart disease, stroke, and other problems.  Blood pressure consists of two numbers, a higher number over a lower, 110/72, for example. It is stated as "110 over 72." The ideal is below 120 for the top number (systolic) and under 80 for the bottom (diastolic). Write down your blood pressure today. You should pay close attention to your blood pressure if you have certain conditions such as:  Heart failure.  Prior heart attack.  Diabetes  Chronic kidney disease.  Prior stroke.  Multiple risk factors for heart disease. To see if you have HTN, your blood pressure should be measured while you are seated with your arm held at the level of the heart. It should be measured at least twice. A one-time elevated blood pressure reading (especially in the Emergency Department) does not mean that you need treatment. There may be conditions in which the blood pressure is different between your right and left arms. It is important to see your caregiver soon for a recheck. Most people have essential hypertension which means that there is not a specific cause. This type of high blood pressure may be   lowered by changing lifestyle factors such as:  Stress.  Smoking.  Lack of exercise.  Excessive weight.  Drug/tobacco/alcohol use.  Eating less salt. Most people do not have symptoms from high blood pressure until it has caused damage to the body. Effective treatment can often prevent, delay or reduce that damage. TREATMENT  When a cause has been identified, treatment for high blood pressure is directed at the cause. There are a large number of medications to treat HTN. These fall into several categories, and your caregiver will help you select the medicines that are best for you. Medications may have side effects. You should  review side effects with your caregiver. If your blood pressure stays high after you have made lifestyle changes or started on medicines,   Your medication(s) may need to be changed.  Other problems may need to be addressed.  Be certain you understand your prescriptions, and know how and when to take your medicine.  Be sure to follow up with your caregiver within the time frame advised (usually within two weeks) to have your blood pressure rechecked and to review your medications.  If you are taking more than one medicine to lower your blood pressure, make sure you know how and at what times they should be taken. Taking two medicines at the same time can result in blood pressure that is too low. SEEK IMMEDIATE MEDICAL CARE IF:  You develop a severe headache, blurred or changing vision, or confusion.  You have unusual weakness or numbness, or a faint feeling.  You have severe chest or abdominal pain, vomiting, or breathing problems. MAKE SURE YOU:   Understand these instructions.  Will watch your condition.  Will get help right away if you are not doing well or get worse.   Diabetes and Exercise Exercising regularly is important. It is not just about losing weight. It has many health benefits, such as:  Improving your overall fitness, flexibility, and endurance.  Increasing your bone density.  Helping with weight control.  Decreasing your body fat.  Increasing your muscle strength.  Reducing stress and tension.  Improving your overall health. People with diabetes who exercise gain additional benefits because exercise:  Reduces appetite.  Improves the body's use of blood sugar (glucose).  Helps lower or control blood glucose.  Decreases blood pressure.  Helps control blood lipids (such as cholesterol and triglycerides).  Improves the body's use of the hormone insulin by:  Increasing the body's insulin sensitivity.  Reducing the body's insulin  needs.  Decreases the risk for heart disease because exercising:  Lowers cholesterol and triglycerides levels.  Increases the levels of good cholesterol (such as high-density lipoproteins [HDL]) in the body.  Lowers blood glucose levels. YOUR ACTIVITY PLAN  Choose an activity that you enjoy and set realistic goals. Your health care provider or diabetes educator can help you make an activity plan that works for you. You can break activities into 2 or 3 sessions throughout the day. Doing so is as good as one long session. Exercise ideas include:  Taking the dog for a walk.  Taking the stairs instead of the elevator.  Dancing to your favorite song.  Doing your favorite exercise with a friend. RECOMMENDATIONS FOR EXERCISING WITH TYPE 1 OR TYPE 2 DIABETES   Check your blood glucose before exercising. If blood glucose levels are greater than 240 mg/dL, check for urine ketones. Do not exercise if ketones are present.  Avoid injecting insulin into areas of the body that are going   to be exercised. For example, avoid injecting insulin into:  The arms when playing tennis.  The legs when jogging.  Keep a record of:  Food intake before and after you exercise.  Expected peak times of insulin action.  Blood glucose levels before and after you exercise.  The type and amount of exercise you have done.  Review your records with your health care provider. Your health care provider will help you to develop guidelines for adjusting food intake and insulin amounts before and after exercising.  If you take insulin or oral hypoglycemic agents, watch for signs and symptoms of hypoglycemia. They include:  Dizziness.  Shaking.  Sweating.  Chills.  Confusion.  Drink plenty of water while you exercise to prevent dehydration or heat stroke. Body water is lost during exercise and must be replaced.  Talk to your health care provider before starting an exercise program to make sure it is safe  for you. Remember, almost any type of activity is better than none.    Cholesterol Cholesterol is a white, waxy, fat-like protein needed by your body in small amounts. The liver makes all the cholesterol you need. It is carried from the liver by the blood through the blood vessels. Deposits (plaque) may build up on blood vessel walls. This makes the arteries narrower and stiffer. Plaque increases the risk for heart attack and stroke. You cannot feel your cholesterol level even if it is very high. The only way to know is by a blood test to check your lipid (fats) levels. Once you know your cholesterol levels, you should keep a record of the test results. Work with your caregiver to to keep your levels in the desired range. WHAT THE RESULTS MEAN:  Total cholesterol is a rough measure of all the cholesterol in your blood.  LDL is the so-called bad cholesterol. This is the type that deposits cholesterol in the walls of the arteries. You want this level to be low.  HDL is the good cholesterol because it cleans the arteries and carries the LDL away. You want this level to be high.  Triglycerides are fat that the body can either burn for energy or store. High levels are closely linked to heart disease. DESIRED LEVELS:  Total cholesterol below 200.  LDL below 100 for people at risk, below 70 for very high risk.  HDL above 50 is good, above 60 is best.  Triglycerides below 150. HOW TO LOWER YOUR CHOLESTEROL:  Diet.  Choose fish or white meat chicken and turkey, roasted or baked. Limit fatty cuts of red meat, fried foods, and processed meats, such as sausage and lunch meat.  Eat lots of fresh fruits and vegetables. Choose whole grains, beans, pasta, potatoes and cereals.  Use only small amounts of olive, corn or canola oils. Avoid butter, mayonnaise, shortening or palm kernel oils. Avoid foods with trans-fats.  Use skim/nonfat milk and low-fat/nonfat yogurt and cheeses. Avoid whole milk,  cream, ice cream, egg yolks and cheeses. Healthy desserts include angel food cake, ginger snaps, animal crackers, hard candy, popsicles, and low-fat/nonfat frozen yogurt. Avoid pastries, cakes, pies and cookies.  Exercise.  A regular program helps decrease LDL and raises HDL.  Helps with weight control.  Do things that increase your activity level like gardening, walking, or taking the stairs.  Medication.  May be prescribed by your caregiver to help lowering cholesterol and the risk for heart disease.  You may need medicine even if your levels are normal if you have   several risk factors. HOME CARE INSTRUCTIONS  7. Follow your diet and exercise programs as suggested by your caregiver. 8. Take medications as directed. 9. Have blood work done when your caregiver feels it is necessary. MAKE SURE YOU:   Understand these instructions.  Will watch your condition.  Will get help right away if you are not doing well or get worse.      Vitamin D Deficiency Vitamin D is an important vitamin that your body needs. Having too little of it in your body is called a deficiency. A very bad deficiency can make your bones soft and can cause a condition called rickets.  Vitamin D is important to your body for different reasons, such as:   It helps your body absorb 2 minerals called calcium and phosphorus.  It helps make your bones healthy.  It may prevent some diseases, such as diabetes and multiple sclerosis.  It helps your muscles and heart. You can get vitamin D in several ways. It is a natural part of some foods. The vitamin is also added to some dairy products and cereals. Some people take vitamin D supplements. Also, your body makes vitamin D when you are in the sun. It changes the sun's rays into a form of the vitamin that your body can use. CAUSES   Not eating enough foods that contain vitamin D.  Not getting enough sunlight.  Having certain digestive system diseases that make it  hard to absorb vitamin D. These diseases include Crohn's disease, chronic pancreatitis, and cystic fibrosis.  Having a surgery in which part of the stomach or small intestine is removed.  Being obese. Fat cells pull vitamin D out of your blood. That means that obese people may not have enough vitamin D left in their blood and in other body tissues.  Having chronic kidney or liver disease. RISK FACTORS Risk factors are things that make you more likely to develop a vitamin D deficiency. They include:  Being older.  Not being able to get outside very much.  Living in a nursing home.  Having had broken bones.  Having weak or thin bones (osteoporosis).  Having a disease or condition that changes how your body absorbs vitamin D.  Having dark skin.  Some medicines such as seizure medicines or steroids.  Being overweight or obese. SYMPTOMS Mild cases of vitamin D deficiency may not have any symptoms. If you have a very bad case, symptoms may include:  Bone pain.  Muscle pain.  Falling often.  Broken bones caused by a minor injury, due to osteoporosis. DIAGNOSIS A blood test is the best way to tell if you have a vitamin D deficiency. TREATMENT Vitamin D deficiency can be treated in different ways. Treatment for vitamin D deficiency depends on what is causing it. Options include:  Taking vitamin D supplements.  Taking a calcium supplement. Your caregiver will suggest what dose is best for you. HOME CARE INSTRUCTIONS  Take any supplements that your caregiver prescribes. Follow the directions carefully. Take only the suggested amount.  Have your blood tested 2 months after you start taking supplements.  Eat foods that contain vitamin D. Healthy choices include:  Fortified dairy products, cereals, or juices. Fortified means vitamin D has been added to the food. Check the label on the package to be sure.  Fatty fish like salmon or trout.  Eggs.  Oysters.  Do not use a  tanning bed.  Keep your weight at a healthy level. Lose weight if you   need to.  Keep all follow-up appointments. Your caregiver will need to perform blood tests to make sure your vitamin D deficiency is going away. SEEK MEDICAL CARE IF:  You have any questions about your treatment.  You continue to have symptoms of vitamin D deficiency.  You have nausea or vomiting.  You are constipated.  You feel confused.  You have severe abdominal or back pain. MAKE SURE YOU:  Understand these instructions.  Will watch your condition.  Will get help right away if you are not doing well or get worse.   

## 2013-08-14 NOTE — Progress Notes (Signed)
Patient ID: [name], male   DOB: 03/13/1941, 73 y.o.   MRN: [id number]    This very nice 73 y.o. male presents for 3 month follow up with Hypertension, Hyperlipidemia, Pre-Diabetes and Vitamin D Deficiency. Patient was recently hospitalized Mar 23-27,2015 at [facility] and has follow-up scheduled with a pulmonologist in [place]. Apparently there is a question or suspicion that he may have a lung cancer   HTN predates since   . BP has been controlled at home. Today's BP: 126/68 mmHg . Patient denies any cardiac type chest pain, palpitations, dyspnea/orthopnea/PND, dizziness, claudication, or dependent edema.   Hyperlipidemia is controlled with diet & meds. Last Cholesterol was 129, Triglycerides were 186, HDL 28 and LDL 64 in Nov 2014 - at goal. Patient denies myalgias or other med SE's.    Also, the patient has history of T2 NIDDM since 2007 and  last A1c was 5.9% in Nov 2014. Patient denies any symptoms of reactive hypoglycemia, diabetic polys, paresthesias or visual blurring.   Further, Patient has history of Vitamin D Deficiency with last vitamin D of 35 in Feb 2014. Patient supplements vitamin D without any suspected side-effects.  Medication Sig  . aspirin EC 81 MG tablet Take 81 mg by mouth daily.    Marland Kitchen atorvastatin (LIPITOR) 40 MG tablet TAKE 1 TABLET EVERY DAY FOR CHOLESTEROL  . benazepril (LOTENSIN) 40 MG tablet Take 1 tablet (40 mg total) by mouth daily.  . calcium-vitamin D (OSCAL WITH D) 500-200 MG-UNIT per tablet Take 1 tablet by mouth daily.    . clobetasol ointment (TEMOVATE) 0.05 % Apply to eczema rash 2-3 x/da  . Flaxseed, Linseed, (FLAXSEED OIL) 1000 MG  Take 1,000 mg by mouth daily.  Marland Kitchen glucose blood test strip Test blood sugar 3 times daily or as directed for fluctuating blood sugars.   Marland Kitchen guaiFENesin (MUCINEX) 600 MG 12 hr tablet Take 1,200 mg by mouth 2 (two) times daily.    Marland Kitchen  VICODIN)5-500 MG per tablet Take 1 tablet by mouth every 6 (six) hours as  needed.    . metFORMIN  500 MG XR tablet Take 1 tablet (500 mg total) by mouth 2 (two) times daily with a meal.  . metoprolol tartrate 25 MG tablet   . Fish oil Take 1 by mouth daily.    Marland Kitchen tiotropium (SPIRIVA) 18 MCG inhalation  Place 18 mcg into inhaler and inhale daily.    Allergies  Allergen Reactions  . Crestor [Rosuvastatin]   . Latex Itching  . Neomycin-Bacitracin Zn-Polymyx Other (See Comments)    Causes blisters  . Ciprofloxacin Rash   PMHx:   Past Medical History  Diagnosis Date  . COPD (chronic obstructive pulmonary disease)   . Emphysema   . Hypertension   . Diabetes mellitus   . Hypercholesteremia    FHx:    Reviewed / unchanged  SHx:    Reviewed / unchanged   Systems Review:  Constitutional: Denies fever, chills, wt changes, headaches, insomnia, fatigue, night sweats, change in appetite. 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 Denies redness, diplopia, discharge, itchy, watery eyes.  ENT: Denies discharge, congestion, post nasal drip, epistaxis, sore throat, earache, hearing loss, dental pain, tinnitus, vertigo, sinus pain, snoring.  CV: Denies chest pain, palpitations, irregular heartbeat, syncope, dyspnea, diaphoresis, orthopnea, PND, claudication, edema. Respiratory: denies cough, dyspnea, DOE, pleurisy, hoarseness, laryngitis, wheezing.  Gastrointestinal: Denies dysphagia, odynophagia, heartburn, reflux, water brash, abdominal pain or cramps, nausea, vomiting, bloating, diarrhea, constipation, hematemesis, melena,  hematochezia,  or hemorrhoids. Genitourinary: Denies dysuria, frequency, urgency, nocturia, hesitancy, discharge, hematuria, flank pain. Musculoskeletal: Denies arthralgias, myalgias, stiffness, jt. swelling, pain, limp, strain/sprain.  Skin: Denies pruritus, rash, hives, warts, acne, eczema, change in skin lesion(s). Neuro: No weakness, tremor, incoordination, spasms, paresthesia, or pain. Psychiatric: Denies confusion, memory  loss, or sensory loss. Endo: Denies change in weight, skin, hair change.  Heme/Lymph: No excessive bleeding, bruising, orenlarged lymph nodes.   Exam:  BP 126/68  Pulse 88  Temp(Src) 99 F (37.2 C) (Temporal)  Resp 16  Ht 5\' 10"  (1.778 m)  Wt 202 lb 6.4 oz (91.808 kg)  BMI 29.04 kg/m2  Appears well nourished - in no distress. Eyes: PERRLA, EOMs, conjunctiva no swelling or erythema. Sinuses: No frontal/maxillary tenderness ENT/Mouth: EAC's clear, TM's nl w/o erythema, bulging. Nares clear w/o erythema, swelling, exudates. Oropharynx clear without erythema or exudates. Oral hygiene is good. Tongue normal, non obstructing. Hearing intact.  Neck: Supple. Thyroid nl. Car 2+/2+ without bruits, nodes or JVD. Chest: Respirations nl with BS clear & equal w/o rales, rhonchi, wheezing or stridor.  Cor: Heart sounds normal w/ regular rate and rhythm without sig. murmurs, gallops, clicks, or rubs. Peripheral pulses normal and equal  without edema.  Abdomen: Soft & bowel sounds normal. Non-tender w/o guarding, rebound, hernias, masses, or organomegaly.  Lymphatics: Unremarkable.  Musculoskeletal: Full ROM all peripheral extremities, joint stability, 5/5 strength, and normal gait.  Skin: Warm, dry without exposed rashes, lesions, ecchymosis apparent.  Neuro: Cranial nerves intact, reflexes equal bilaterally. Sensory-motor testing grossly intact. Tendon reflexes grossly intact.  Pysch: Alert & oriented x 3. Insight and judgement nl & appropriate. No ideations.  Assessment and Plan:  1. Hypertension - Continue monitor blood pressure at home. Continue diet/meds same.  2. Hyperlipidemia - Continue diet/meds, exercise,& lifestyle modifications. Continue monitor periodic cholesterol/liver & renal functions   3. T2 NIDDM - continue recommend prudent low glycemic diet, weight control, regular exercise, diabetic monitoring and periodic eye exams.  4. Vitamin D Deficiency - Continue  supplementation.  Recommended regular exercise, BP monitoring, weight control, and discussed med and SE's. Recommended labs to assess and monitor clinical status. Further disposition pending results of labs.

## 2013-08-15 LAB — HEPATIC FUNCTION PANEL
ALK PHOS: 56 U/L (ref 39–117)
ALT: 31 U/L (ref 0–53)
AST: 19 U/L (ref 0–37)
Albumin: 3.9 g/dL (ref 3.5–5.2)
BILIRUBIN DIRECT: 0.1 mg/dL (ref 0.0–0.3)
BILIRUBIN TOTAL: 0.4 mg/dL (ref 0.2–1.2)
Indirect Bilirubin: 0.3 mg/dL (ref 0.2–1.2)
Total Protein: 6.2 g/dL (ref 6.0–8.3)

## 2013-08-15 LAB — BASIC METABOLIC PANEL WITH GFR
BUN: 12 mg/dL (ref 6–23)
CHLORIDE: 100 meq/L (ref 96–112)
CO2: 27 meq/L (ref 19–32)
CREATININE: 0.87 mg/dL (ref 0.50–1.35)
Calcium: 9.3 mg/dL (ref 8.4–10.5)
GFR, Est African American: >89 mL/min
GFR, Est Non African American: 84 mL/min
GLUCOSE: 102 mg/dL — AB (ref 70–99)
Potassium: 5.2 mEq/L (ref 3.5–5.3)
Sodium: 137 mEq/L (ref 135–145)

## 2013-08-15 LAB — CBC WITH DIFFERENTIAL/PLATELET
Basophils Absolute: 0.1 10*3/uL (ref 0.0–0.1)
Basophils Relative: 1 % (ref 0–1)
EOS PCT: 6 % — AB (ref 0–5)
Eosinophils Absolute: 0.5 10*3/uL (ref 0.0–0.7)
HEMATOCRIT: 37 % — AB (ref 39.0–52.0)
Hemoglobin: 12.8 g/dL — ABNORMAL LOW (ref 13.0–17.0)
LYMPHS ABS: 2.6 10*3/uL (ref 0.7–4.0)
LYMPHS PCT: 29 % (ref 12–46)
MCH: 28 pg (ref 26.0–34.0)
MCHC: 34.6 g/dL (ref 30.0–36.0)
MCV: 81 fL (ref 78.0–100.0)
Monocytes Absolute: 1.9 10*3/uL — ABNORMAL HIGH (ref 0.1–1.0)
Monocytes Relative: 21 % — ABNORMAL HIGH (ref 3–12)
Neutro Abs: 3.9 10*3/uL (ref 1.7–7.7)
Neutrophils Relative %: 43 % (ref 43–77)
PLATELETS: 498 10*3/uL — AB (ref 150–400)
RBC: 4.57 MIL/uL (ref 4.22–5.81)
RDW: 14.5 % (ref 11.5–15.5)
WBC: 9 10*3/uL (ref 4.0–10.5)

## 2013-08-15 LAB — LIPID PANEL
CHOL/HDL RATIO: 4.2 ratio
Cholesterol: 89 mg/dL (ref 0–200)
HDL: 21 mg/dL — ABNORMAL LOW (ref >39)
LDL CALC: 46 mg/dL (ref 0–99)
Triglycerides: 112 mg/dL (ref <150)
VLDL: 22 mg/dL (ref 0–40)

## 2013-08-15 LAB — MAGNESIUM: Magnesium: 1.6 mg/dL (ref 1.5–2.5)

## 2013-08-15 LAB — INSULIN, FASTING: Insulin fasting, serum: 8 u[IU]/mL (ref 3–28)

## 2013-08-15 LAB — TSH: TSH: 1.313 u[IU]/mL (ref 0.350–4.500)

## 2013-08-15 LAB — HEMOGLOBIN A1C
Hgb A1c MFr Bld: 7.1 % — ABNORMAL HIGH (ref <5.7)
Mean Plasma Glucose: 157 mg/dL — ABNORMAL HIGH (ref <117)

## 2013-08-15 LAB — VITAMIN D 25 HYDROXY (VIT D DEFICIENCY, FRACTURES): Vit D, 25-Hydroxy: 87 ng/mL (ref 30–89)

## 2013-08-29 ENCOUNTER — Ambulatory Visit: Payer: Self-pay | Admitting: Internal Medicine

## 2013-09-02 LAB — BRONCHIAL WASH CULTURE

## 2013-09-06 ENCOUNTER — Ambulatory Visit: Payer: Self-pay | Admitting: Internal Medicine

## 2013-09-06 ENCOUNTER — Other Ambulatory Visit: Payer: Self-pay | Admitting: Internal Medicine

## 2013-09-19 LAB — CULTURE, FUNGUS WITHOUT SMEAR

## 2013-09-27 ENCOUNTER — Other Ambulatory Visit: Payer: Self-pay | Admitting: Internal Medicine

## 2013-10-11 ENCOUNTER — Other Ambulatory Visit: Payer: Self-pay | Admitting: Physician Assistant

## 2013-10-26 DIAGNOSIS — J449 Chronic obstructive pulmonary disease, unspecified: Secondary | ICD-10-CM | POA: Insufficient documentation

## 2013-11-15 ENCOUNTER — Ambulatory Visit: Payer: Self-pay | Admitting: Emergency Medicine

## 2013-12-05 ENCOUNTER — Encounter: Payer: Self-pay | Admitting: Physician Assistant

## 2013-12-05 ENCOUNTER — Ambulatory Visit (INDEPENDENT_AMBULATORY_CARE_PROVIDER_SITE_OTHER): Payer: Medicare Other | Admitting: Physician Assistant

## 2013-12-05 VITALS — BP 110/70 | HR 76 | Temp 98.1°F | Resp 16 | Ht 70.0 in | Wt 202.0 lb

## 2013-12-05 DIAGNOSIS — E119 Type 2 diabetes mellitus without complications: Secondary | ICD-10-CM

## 2013-12-05 DIAGNOSIS — E669 Obesity, unspecified: Secondary | ICD-10-CM

## 2013-12-05 DIAGNOSIS — E782 Mixed hyperlipidemia: Secondary | ICD-10-CM

## 2013-12-05 DIAGNOSIS — Z79899 Other long term (current) drug therapy: Secondary | ICD-10-CM

## 2013-12-05 DIAGNOSIS — E559 Vitamin D deficiency, unspecified: Secondary | ICD-10-CM

## 2013-12-05 DIAGNOSIS — I10 Essential (primary) hypertension: Secondary | ICD-10-CM

## 2013-12-05 LAB — BASIC METABOLIC PANEL WITH GFR
BUN: 16 mg/dL (ref 6–23)
CHLORIDE: 99 meq/L (ref 96–112)
CO2: 26 meq/L (ref 19–32)
CREATININE: 0.94 mg/dL (ref 0.50–1.35)
Calcium: 10.2 mg/dL (ref 8.4–10.5)
GFR, Est African American: >89 mL/min
GFR, Est Non African American: 79 mL/min
Glucose, Bld: 121 mg/dL — ABNORMAL HIGH (ref 70–99)
Potassium: 4.8 mEq/L (ref 3.5–5.3)
Sodium: 136 mEq/L (ref 135–145)

## 2013-12-05 LAB — HEPATIC FUNCTION PANEL
ALBUMIN: 4.4 g/dL (ref 3.5–5.2)
ALK PHOS: 56 U/L (ref 39–117)
ALT: 21 U/L (ref 0–53)
AST: 14 U/L (ref 0–37)
BILIRUBIN TOTAL: 0.9 mg/dL (ref 0.2–1.2)
Bilirubin, Direct: 0.2 mg/dL (ref 0.0–0.3)
Indirect Bilirubin: 0.7 mg/dL (ref 0.2–1.2)
TOTAL PROTEIN: 7 g/dL (ref 6.0–8.3)

## 2013-12-05 LAB — MAGNESIUM: Magnesium: 1.7 mg/dL (ref 1.5–2.5)

## 2013-12-05 LAB — LIPID PANEL
Cholesterol: 115 mg/dL (ref 0–200)
HDL: 28 mg/dL — AB (ref >39)
LDL CALC: 59 mg/dL (ref 0–99)
Total CHOL/HDL Ratio: 4.1 Ratio
Triglycerides: 140 mg/dL (ref <150)
VLDL: 28 mg/dL (ref 0–40)

## 2013-12-05 MED ORDER — HYDROCODONE-ACETAMINOPHEN 5-325 MG PO TABS
1.0000 | ORAL_TABLET | Freq: Four times a day (QID) | ORAL | Status: DC | PRN
Start: 2013-12-05 — End: 2015-09-05

## 2013-12-05 MED ORDER — FLUTICASONE FUROATE-VILANTEROL 100-25 MCG/INH IN AEPB: INHALATION_SPRAY | RESPIRATORY_TRACT | Status: DC

## 2013-12-05 NOTE — Progress Notes (Signed)
Assessment and Plan:  Hypertension: Continue medication, monitor blood pressure at home. Continue DASH diet. Cholesterol: Continue diet and exercise. Check cholesterol.  Diabetes-Continue diet and exercise. Check A1C Vitamin D Def- check level and continue medications.  Left shoulder pain/AC- will refill hydrocodone, if worse will get Xray/send to ortho.  COPD- went to see pulmonary doctor, states the Memory Dance makes him feel better, will give samples  Continue diet and meds as discussed. Further disposition pending results of labs. Discussed med's effects and SE's.    HPI 73 y.o. male  presents for 3 month follow up with hypertension, hyperlipidemia, diabetes and vitamin D. His blood pressure has been controlled at home, today their BP is BP: 110/70 mmHg He does not workout. He denies chest pain, shortness of breath, dizziness.  He is on cholesterol medication and denies myalgias. His cholesterol is at goal. The cholesterol last visit was:   Lab Results  Component Value Date   CHOL 89 08/14/2013   HDL 21* 08/14/2013   LDLCALC 46 08/14/2013   TRIG 112 08/14/2013   CHOLHDL 4.2 08/14/2013   He has been working on diet and exercise for Diabetes, he checks his sugars it runs from 104 to 180's, he is on metformin, denies hypoglycemia, he is on an ACE, and denies polydipsia and polyuria. Last A1C in the office was:  Lab Results  Component Value Date   HGBA1C 7.1* 08/14/2013   Patient is on Vitamin D supplement. Lab Results  Component Value Date   VD25OH 87 08/14/2013     COPD, has inhalers, and states breathing is doing well. Denies increased sputum.   Current Medications:  Current Outpatient Prescriptions on File Prior to Visit  Medication Sig Dispense Refill  . aspirin EC 81 MG tablet Take 81 mg by mouth daily.        Marland Kitchen atorvastatin (LIPITOR) 40 MG tablet TAKE 1 TABLET EVERY DAY FOR CHOLESTEROL  30 tablet  2  . benazepril (LOTENSIN) 40 MG tablet Take 1 tablet (40 mg total) by mouth daily.   90 tablet  3  . calcium-vitamin D (OSCAL WITH D) 500-200 MG-UNIT per tablet Take 1 tablet by mouth daily.        . clobetasol ointment (TEMOVATE) 0.05 % Apply to eczema rash 2-3 x/da  30 g  99  . Flaxseed, Linseed, (FLAXSEED OIL) 1000 MG CAPS Take 1,000 mg by mouth daily.      Marland Kitchen glucose blood test strip Test blood sugar 3 times daily or as directed for fluctuating blood sugars. DX 250.00  100 each  6  . guaiFENesin (MUCINEX) 600 MG 12 hr tablet Take 1,200 mg by mouth 2 (two) times daily.        Marland Kitchen HYDROcodone-acetaminophen (VICODIN) 5-500 MG per tablet Take 1 tablet by mouth every 6 (six) hours as needed.        . metFORMIN (GLUCOPHAGE) 500 MG tablet Take 1 tablet (500 mg total) by mouth 2 (two) times daily with a meal.  120 tablet  3  . metoprolol tartrate (LOPRESSOR) 25 MG tablet TAKE 1 TABLET BY MOUTH TWICE A DAY  60 tablet  3  . omega-3 acid ethyl esters (LOVAZA) 1 G capsule Take 1 g by mouth daily.        Marland Kitchen tiotropium (SPIRIVA) 18 MCG inhalation capsule Place 18 mcg into inhaler and inhale daily.        No current facility-administered medications on file prior to visit.   Medical History:  Past Medical History  Diagnosis Date  . COPD (chronic obstructive pulmonary disease)   . Emphysema   . Hypertension   . Diabetes mellitus   . Hypercholesteremia    Allergies:  Allergies  Allergen Reactions  . Crestor [Rosuvastatin]   . Latex Itching  . Neomycin-Bacitracin Zn-Polymyx Other (See Comments)    Causes blisters  . Ciprofloxacin Rash    Review of Systems: [X]  = complains of  [ ]  = denies  General: Fatigue [ ]  Fever [ ]  Chills [ ]  Weakness [ ]   Insomnia [ ]  Eyes: Redness [ ]  Blurred vision [ ]  Diplopia [ ]   ENT: Congestion [ ]  Sinus Pain [ ]  Post Nasal Drip [ ]  Sore Throat [ ]  Earache [ ]   Cardiac: Chest pain/pressure [ ]  SOB [ ]  Orthopnea [ ]   Palpitations [ ]   Paroxysmal nocturnal dyspnea[ ]  Claudication [ ]  Edema [ ]   Pulmonary: Cough [ ]  Wheezing[X ]  SOB Valu.Nieves ]  Snoring [ ]    GI: Nausea [ ]  Vomiting[ ]  Dysphagia[ ]  Heartburn[ ]  Abdominal pain [ ]  Constipation Valu.Nieves ]; Diarrhea [ ] ; BRBPR [ ]  Melena[ ]  GU: Hematuria[ ]  Dysuria [ ]  Nocturia[ ]  Urgency [ ]   Hesitancy [ ]  Discharge [ ]  Neuro: Headaches[ ]  Vertigo[ ]  Paresthesias[ ]  Spasm [ ]  Speech changes [ ]  Incoordination [ ]   Ortho: Arthritis [ ]  Joint pain LEFT SHOULDER, WORSE WITH ABDUCTION [x ] Muscle pain [ ]  Joint swelling [ ]  Back Pain [ ]  Skin:  Rash [ ]   Pruritis [ ]  Change in skin lesion [ ]   Psych: Depression[ ]  Anxiety[ ]  Confusion [ ]  Memory loss [ ]   Heme/Lypmh: Bleeding [ ]  Bruising [ ]  Enlarged lymph nodes [ ]   Endocrine: Visual blurring [ ]  Paresthesia [ ]  Polyuria [ ]  Polydypsea [ ]    Heat/cold intolerance [ ]  Hypoglycemia [ ]   Family history- Review and unchanged Social history- Review and unchanged Physical Exam: BP 110/70  Pulse 76  Temp(Src) 98.1 F (36.7 C)  Resp 16  Ht 5\' 10"  (1.778 m)  Wt 202 lb (91.627 kg)  BMI 28.98 kg/m2 Wt Readings from Last 3 Encounters:  12/05/13 202 lb (91.627 kg)  08/14/13 202 lb 6.4 oz (91.808 kg)  08/10/13 204 lb (92.534 kg)   General Appearance: Well nourished, in no apparent distress. Eyes: PERRLA, EOMs, conjunctiva no swelling or erythema Sinuses: No Frontal/maxillary tenderness ENT/Mouth: Ext aud canals clear, TMs without erythema, bulging. No erythema, swelling, or exudate on post pharynx.  Tonsils not swollen or erythematous. Hearing normal.  Neck: Supple, thyroid normal.  Respiratory: Respiratory effort normal, Coarse breath sounds with wheezing without rales, rhonchi, or stridor.  Cardio: RRR with no MRGs. Brisk peripheral pulses with 1+ edema.   Abdomen: Soft, + BS.  Non tender, no guarding, rebound, hernias, masses. Lymphatics: Non tender without lymphadenopathy.  Musculoskeletal: Full ROM, 5/5 strength, normal gait. Left shoulder pain at the [facility] joint, pain with abduction and internal rotation.  Skin: Warm, dry without rashes, lesions,  ecchymosis.  Neuro: Cranial nerves intact. No cerebellar symptoms. Sensation intact.  Psych: Awake and oriented X 3, normal affect, Insight and Judgment appropriate.    Vicie Mutters 2:01 PM

## 2013-12-05 NOTE — Patient Instructions (Signed)
Chronic Obstructive Pulmonary Disease Chronic obstructive pulmonary disease (COPD) is a common lung condition in which airflow from the lungs is limited. COPD is a general term that can be used to describe many different lung problems that limit airflow, including both chronic bronchitis and emphysema. If you have COPD, your lung function will probably never return to normal, but there are measures you can take to improve lung function and make yourself feel better.  CAUSES   Smoking (common).   Exposure to secondhand smoke.   Genetic problems.  Chronic inflammatory lung diseases or recurrent infections. SYMPTOMS   Shortness of breath, especially with physical activity.   Deep, persistent (chronic) cough with a large amount of thick mucus.   Wheezing.   Rapid breaths (tachypnea).   Gray or bluish discoloration (cyanosis) of the skin, especially in fingers, toes, or lips.   Fatigue.   Weight loss.   Frequent infections or episodes when breathing symptoms become much worse (exacerbations).   Chest tightness. DIAGNOSIS  Your health care provider will take a medical history and perform a physical examination to make the initial diagnosis. Additional tests for COPD may include:   Lung (pulmonary) function tests.  Chest X-ray.  CT scan.  Blood tests. TREATMENT  Treatment available to help you feel better when you have COPD includes:   Inhaler and nebulizer medicines. These help manage the symptoms of COPD and make your breathing more comfortable.  Supplemental oxygen. Supplemental oxygen is only helpful if you have a low oxygen level in your blood.   Exercise and physical activity. These are beneficial for nearly all people with COPD. Some people may also benefit from a pulmonary rehabilitation program. HOME CARE INSTRUCTIONS   Take all medicines (inhaled or pills) as directed by your health care provider.  Avoid over-the-counter medicines or cough syrups  that dry up your airway (such as antihistamines) and slow down the elimination of secretions unless instructed otherwise by your health care provider.   If you are a smoker, the most important thing that you can do is stop smoking. Continuing to smoke will cause further lung damage and breathing trouble. Ask your health care provider for help with quitting smoking. He or she can direct you to community resources or hospitals that provide support.  Avoid exposure to irritants such as smoke, chemicals, and fumes that aggravate your breathing.  Use oxygen therapy and pulmonary rehabilitation if directed by your health care provider. If you require home oxygen therapy, ask your health care provider whether you should purchase a pulse oximeter to measure your oxygen level at home.   Avoid contact with individuals who have a contagious illness.  Avoid extreme temperature and humidity changes.  Eat healthy foods. Eating smaller, more frequent meals and resting before meals may help you maintain your strength.  Stay active, but balance activity with periods of rest. Exercise and physical activity will help you maintain your ability to do things you want to do.  Preventing infection and hospitalization is very important when you have COPD. Make sure to receive all the vaccines your health care provider recommends, especially the pneumococcal and influenza vaccines. Ask your health care provider whether you need a pneumonia vaccine.  Learn and use relaxation techniques to manage stress.  Learn and use controlled breathing techniques as directed by your health care provider. Controlled breathing techniques include:   Pursed lip breathing. Start by breathing in (inhaling) through your nose for 1 second. Then, purse your lips as if you were   going to whistle and breathe out (exhale) through the pursed lips for 2 seconds.   Diaphragmatic breathing. Start by putting one hand on your abdomen just above  your waist. Inhale slowly through your nose. The hand on your abdomen should move out. Then purse your lips and exhale slowly. You should be able to feel the hand on your abdomen moving in as you exhale.   Learn and use controlled coughing to clear mucus from your lungs. Controlled coughing is a series of short, progressive coughs. The steps of controlled coughing are:  1. Lean your head slightly forward.  2. Breathe in deeply using diaphragmatic breathing.  3. Try to hold your breath for 3 seconds.  4. Keep your mouth slightly open while coughing twice.  5. Spit any mucus out into a tissue.  6. Rest and repeat the steps once or twice as needed. SEEK MEDICAL CARE IF:   You are coughing up more mucus than usual.   There is a change in the color or thickness of your mucus.   Your breathing is more labored than usual.   Your breathing is faster than usual.  SEEK IMMEDIATE MEDICAL CARE IF:   You have shortness of breath while you are resting.   You have shortness of breath that prevents you from:  Being able to talk.   Performing your usual physical activities.   You have chest pain lasting longer than 5 minutes.   Your skin color is more cyanotic than usual.  You measure low oxygen saturations for longer than 5 minutes with a pulse oximeter. MAKE SURE YOU:   Understand these instructions.  Will watch your condition.  Will get help right away if you are not doing well or get worse. Document Released: 01/21/2005 Document Revised: 08/28/2013 Document Reviewed: 12/08/2012 [facility] Patient Information 2015 [place], [place]. This information is not intended to replace advice given to you by your health care provider. Make sure you discuss any questions you have with your health care provider.    Bad carbs also include fruit juice, alcohol, and sweet tea. These are empty calories that do not signal to your brain that you are full.   Please remember the good carbs  are still carbs which convert into sugar. So please measure them out no more than 1/2-1 cup of rice, oatmeal, pasta, and beans.  Veggies are however free foods! Pile them on.   I like lean protein at every meal such as chicken, [place], pork chops, cottage cheese, etc. Just do not fry these meats and please center your meal around vegetable, the meats should be a side dish.   No all fruit is created equal. Please see the list below, the fruit at the bottom is higher in sugars than the fruit at the top

## 2013-12-06 LAB — CBC WITH DIFFERENTIAL/PLATELET
Basophils Absolute: 0.1 10*3/uL (ref 0.0–0.1)
Basophils Relative: 1 % (ref 0–1)
EOS PCT: 5 % (ref 0–5)
Eosinophils Absolute: 0.7 10*3/uL (ref 0.0–0.7)
HEMATOCRIT: 46.2 % (ref 39.0–52.0)
Hemoglobin: 16.4 g/dL (ref 13.0–17.0)
LYMPHS ABS: 2.5 10*3/uL (ref 0.7–4.0)
Lymphocytes Relative: 19 % (ref 12–46)
MCH: 28.9 pg (ref 26.0–34.0)
MCHC: 35.5 g/dL (ref 30.0–36.0)
MCV: 81.5 fL (ref 78.0–100.0)
MONO ABS: 2 10*3/uL — AB (ref 0.1–1.0)
MONOS PCT: 15 % — AB (ref 3–12)
Neutro Abs: 7.9 10*3/uL — ABNORMAL HIGH (ref 1.7–7.7)
Neutrophils Relative %: 60 % (ref 43–77)
Platelets: 337 10*3/uL (ref 150–400)
RBC: 5.67 MIL/uL (ref 4.22–5.81)
RDW: 14.9 % (ref 11.5–15.5)
WBC: 13.2 10*3/uL — ABNORMAL HIGH (ref 4.0–10.5)

## 2013-12-06 LAB — HEMOGLOBIN A1C
HEMOGLOBIN A1C: 7 % — AB (ref <5.7)
Mean Plasma Glucose: 154 mg/dL — ABNORMAL HIGH (ref <117)

## 2013-12-06 LAB — VITAMIN D 25 HYDROXY (VIT D DEFICIENCY, FRACTURES): Vit D, 25-Hydroxy: 61 ng/mL (ref 30–89)

## 2013-12-06 LAB — TSH: TSH: 1.845 u[IU]/mL (ref 0.350–4.500)

## 2013-12-19 ENCOUNTER — Other Ambulatory Visit: Payer: Self-pay | Admitting: Internal Medicine

## 2013-12-19 ENCOUNTER — Other Ambulatory Visit: Payer: Self-pay | Admitting: Physician Assistant

## 2014-02-15 ENCOUNTER — Ambulatory Visit (INDEPENDENT_AMBULATORY_CARE_PROVIDER_SITE_OTHER): Payer: Medicare Other | Admitting: Physician Assistant

## 2014-02-15 ENCOUNTER — Encounter: Payer: Self-pay | Admitting: Physician Assistant

## 2014-02-15 VITALS — BP 120/82 | HR 64 | Temp 98.1°F | Resp 16 | Ht 70.0 in | Wt 207.0 lb

## 2014-02-15 DIAGNOSIS — Z1331 Encounter for screening for depression: Secondary | ICD-10-CM

## 2014-02-15 DIAGNOSIS — N320 Bladder-neck obstruction: Secondary | ICD-10-CM

## 2014-02-15 DIAGNOSIS — E782 Mixed hyperlipidemia: Secondary | ICD-10-CM

## 2014-02-15 DIAGNOSIS — E559 Vitamin D deficiency, unspecified: Secondary | ICD-10-CM

## 2014-02-15 DIAGNOSIS — J449 Chronic obstructive pulmonary disease, unspecified: Secondary | ICD-10-CM

## 2014-02-15 DIAGNOSIS — Z9081 Acquired absence of spleen: Secondary | ICD-10-CM

## 2014-02-15 DIAGNOSIS — Z79899 Other long term (current) drug therapy: Secondary | ICD-10-CM

## 2014-02-15 DIAGNOSIS — Z9181 History of falling: Secondary | ICD-10-CM

## 2014-02-15 DIAGNOSIS — R6889 Other general symptoms and signs: Secondary | ICD-10-CM

## 2014-02-15 DIAGNOSIS — E669 Obesity, unspecified: Secondary | ICD-10-CM

## 2014-02-15 DIAGNOSIS — N182 Chronic kidney disease, stage 2 (mild): Secondary | ICD-10-CM

## 2014-02-15 DIAGNOSIS — Z0001 Encounter for general adult medical examination with abnormal findings: Secondary | ICD-10-CM

## 2014-02-15 DIAGNOSIS — E1122 Type 2 diabetes mellitus with diabetic chronic kidney disease: Secondary | ICD-10-CM

## 2014-02-15 DIAGNOSIS — I10 Essential (primary) hypertension: Secondary | ICD-10-CM

## 2014-02-15 MED ORDER — AZITHROMYCIN 250 MG PO TABS: ORAL_TABLET | ORAL | Status: AC

## 2014-02-15 NOTE — Progress Notes (Signed)
MEDICARE ANNUAL WELLNESS VISIT AND FOLLOW UP Assessment:   1. Essential hypertension - CBC with Differential - BASIC METABOLIC PANEL WITH GFR - Hepatic function panel - TSH  2. CKD stage 2 due to type 2 diabetes mellitus Discussed general issues about diabetes pathophysiology and management., Educational material distributed., Suggested low cholesterol diet., Encouraged aerobic exercise., Discussed foot care., Reminded to get yearly retinal exam. - Hemoglobin A1c - HM DIABETES FOOT EXAM  3. Chronic obstructive pulmonary disease, unspecified COPD, unspecified chronic bronchitis type Continue follow up with pulmonary  4. BPH/prostatism controlled  5. Hyperlipidemia - Lipid panel  6. Obesity Obesity with co morbidities- long discussion about weight loss, diet, and exercise  7. History of splenectomy-for hereditary spherocytosis Declines vaccines, monitor CBC  8. Vitamin D deficiency - Vit D  25 hydroxy (rtn osteoporosis monitoring)  9. Encounter for long-term (current) use of medications - Magnesium    Plan:   During the course of the visit the patient was educated and counseled about appropriate screening and preventive services including:    Pneumococcal vaccine   Influenza vaccine  Td vaccine  Screening electrocardiogram  Colorectal cancer screening  Diabetes screening  Glaucoma screening  Nutrition counseling   Screening recommendations, referrals: Vaccinations: Please see documentation below and orders this visit.  Nutrition assessed and recommended  Colonoscopy up to date Recommended yearly ophthalmology/optometry visit for glaucoma screening and checkup Recommended yearly dental visit for hygiene and checkup Advanced directives - requested  Conditions/risks identified: BMI: Discussed weight loss, diet, and increase physical activity.  Increase physical activity: AHA recommends 150 minutes of physical activity a week.  Medications  reviewed Diabetes is not at goal, ACE/ARB therapy: Yes. Urinary Incontinence is not an issue: discussed non pharmacology and pharmacology options.  Fall risk: high- discussed PT, home fall assessment, medications.    Subjective:  [name] is a 73 y.o. male who presents for Medicare Annual Wellness Visit and 3 month follow up for HTN, hyperlipidemia, diabetes and vitamin D Def.  Date of last medicare wellness visit was is unknown.  His blood pressure has been controlled at home, today their BP is BP: 120/82 mmHg He does not workout. He denies chest pain, shortness of breath, dizziness.  He is on cholesterol medication and denies myalgias. His cholesterol is at goal. The cholesterol last visit was:   Lab Results  Component Value Date   CHOL 115 12/05/2013   HDL 28* 12/05/2013   LDLCALC 59 12/05/2013   TRIG 140 12/05/2013   CHOLHDL 4.1 12/05/2013   He has been working on diet and exercise for diabetes, he is on ACE, metformin, and denies hypoglycemia , paresthesia of the feet, polydipsia and polyuria. Last A1C in the office was:  Lab Results  Component Value Date   HGBA1C 7.0* 12/05/2013   Patient is on Vitamin D supplement.   Lab Results  Component Value Date   VD25OH 34 12/05/2013     He has COPD and had recent CT chest 02/2013 for a nodule following with Dr. Wayland Denis and he has declined any other work up. He has dyspnea with walking to his mailbox but this is unchanged, he is on spirva daily.   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   Names of Other Physician/Practitioners you currently use: 1. [facility] here for primary care 2. Dr. Delman Cheadle, eye doctor, last visit 8-10 years 3. No  dentist.  Patient Care Team: Unk Pinto, MD as PCP - General (Internal Medicine) Minus Breeding, MD as Consulting Physician (Cardiology) Gatha Mayer, MD as Consulting Physician  (Gastroenterology) Ailene Rud, MD as Consulting Physician (Urology) [facility] Myrtice Lauth, MD as Consulting Physician (Dermatology) Brand Males, MD as Consulting Physician (Pulmonary Disease)  Medication Review: Current Outpatient Prescriptions on File Prior to Visit  Medication Sig Dispense Refill  . aspirin EC 81 MG tablet Take 81 mg by mouth daily.        Marland Kitchen atorvastatin (LIPITOR) 40 MG tablet TAKE 1 TABLET EVERY DAY FOR CHOLESTEROL  30 tablet  2  . benazepril (LOTENSIN) 40 MG tablet Take 1 tablet (40 mg total) by mouth daily.  90 tablet  3  . calcium-vitamin D (OSCAL WITH D) 500-200 MG-UNIT per tablet Take 1 tablet by mouth daily.        . clobetasol ointment (TEMOVATE) 0.05 % Apply to eczema rash 2-3 x/da  30 g  99  . Flaxseed, Linseed, (FLAXSEED OIL) 1000 MG CAPS Take 1,000 mg by mouth daily.      . Fluticasone Furoate-Vilanterol 100-25 MCG/INH AEPB Do 1-2 inhalations once daily  60 each  2  . glucose blood test strip Test blood sugar 3 times daily or as directed for fluctuating blood sugars. DX 250.00  100 each  6  . guaiFENesin (MUCINEX) 600 MG 12 hr tablet Take 1,200 mg by mouth 2 (two) times daily.        Marland Kitchen HYDROcodone-acetaminophen (NORCO) 5-325 MG per tablet Take 1 tablet by mouth every 6 (six) hours as needed for moderate pain (for cough).  60 tablet  0  . metFORMIN (GLUCOPHAGE) 500 MG tablet TAKE 1 TABLET BY MOUTH TWICE A DAY WITH A MEAL  120 tablet  3  . metoprolol tartrate (LOPRESSOR) 25 MG tablet TAKE 1 TABLET BY MOUTH TWICE A DAY  60 tablet  3  . metoprolol tartrate (LOPRESSOR) 25 MG tablet TAKE 1 TABLET BY MOUTH TWICE A DAY  60 tablet  2  . omega-3 acid ethyl esters (LOVAZA) 1 G capsule Take 1 g by mouth daily.        Marland Kitchen tiotropium (SPIRIVA) 18 MCG inhalation capsule Place 18 mcg into inhaler and inhale daily.        No current facility-administered medications on file prior to visit.    Current Problems (verified) Patient Active Problem List   Diagnosis Date  Noted  . Pulmonary nodule, right upper lobe, 1cm, new since 2012 03/21/2013  . Essential hypertension 03/09/2013  . Vitamin D deficiency 03/09/2013  . Encounter for long-term (current) use of medications 03/09/2013  . Screening for malignant neoplasm of the rectum 03/09/2013  . Special screening for malignant neoplasm of prostate 03/09/2013  . Melanoma of skin anterior abdominal wall 12/28/2012  . History of splenectomy-for hereditary spherocytosis 12/28/2012  . CKD stage 2 due to type 2 diabetes mellitus 10/03/2008  . Hyperlipidemia 10/03/2008  . Obesity 10/03/2008  . COPD (chronic obstructive pulmonary disease) 10/03/2008  . BPH/prostatism 10/03/2008    Screening Tests Health Maintenance  Topic Date Due  . Foot Exam  05/08/1950  . Ophthalmology Exam  05/08/1950  . Zostavax  05/08/1998  . Pneumococcal Polysaccharide Vaccine Age 80 And Over  05/09/2003  . Tetanus/tdap  04/27/2012  . Influenza Vaccine  11/25/2013  . Urine Microalbumin  03/09/2014  . Hemoglobin A1c  06/07/2014  . Colonoscopy  10/18/2018    Immunization History  Administered Date(s) Administered  .  Pneumococcal-Unspecified 04/27/2001  . Td 04/27/2002   Preventative care: Last colonoscopy: 2010  Prior vaccinations: TD or Tdap: 2004  Influenza: declines Pneumococcal: 2003- states got [facility] Shingles/Zostavax: declines  History reviewed: allergies, current medications, past family history, past medical history, past social history, past surgical history and problem list   Risk Factors: Tobacco History  Substance Use Topics  . Smoking status: Former Smoker -- 3.00 packs/day for 45 years    Types: Cigarettes    Quit date: 04/27/1994  . Smokeless tobacco: Not on file  . Alcohol Use: No   He does not smoke.  Patient is a former smoker. Are there smokers in your home (other than you)?  No  Alcohol Current alcohol use: none  Caffeine Current caffeine use: coffee 3  /day  Exercise Current exercise: none  Nutrition/Diet Current diet: in general, an "unhealthy" diet  Cardiac risk factors: advanced age (older than 42 for men, 29 for women), diabetes mellitus, dyslipidemia, family history of premature cardiovascular disease, hypertension, male gender, obesity (BMI >= 30 kg/m2) and sedentary lifestyle.  Depression Screen (Note: if answer to either of the following is "Yes", a more complete depression screening is indicated)   Q1: Over the past two weeks, have you felt down, depressed or hopeless? No  Q2: Over the past two weeks, have you felt little interest or pleasure in doing things? No  Have you lost interest or pleasure in daily life? No  Do you often feel hopeless? No  Do you cry easily over simple problems? No  Activities of Daily Living In your present state of health, do you have any difficulty performing the following activities?:  Driving? No Managing money?  No Feeding yourself? No Getting from bed to chair? No Climbing a flight of stairs? Yes Preparing food and eating?: No Bathing or showering? No Getting dressed: No Getting to the toilet? No Using the toilet:No Moving around from place to place: Yes In the past year have you fallen or had a near fall?:No   Are you sexually active?  No  Do you have more than one partner?  No  Vision Difficulties: Yes  Hearing Difficulties: Yes Do you often ask people to speak up or repeat themselves? Yes Do you experience ringing or noises in your ears? No Do you have difficulty understanding soft or whispered voices? Yes  Cognition  Do you feel that you have a problem with memory?Yes  Do you often misplace items? No  Do you feel safe at home?  Yes  Advanced directives Does patient have a [place]? Yes Does patient have a Living Will? Yes   Objective:   Blood pressure 120/82, pulse 64, temperature 98.1 F (36.7 C), resp. rate 16, height 5\' 10"  (1.778 m),  weight 207 lb (93.895 kg). Body mass index is 29.7 kg/(m^2).  General appearance: alert, no distress, WD/WN, male Cognitive Testing  Alert? Yes  Normal Appearance?Yes  Oriented to person? Yes  Place? Yes   Time? Yes  Recall of three objects?  2/3  Can perform simple calculations? Yes  Displays appropriate judgment?Yes  Can read the correct time from a watch face?Yes  HEENT: normocephalic, sclerae anicteric, TMs pearly, nares patent, no discharge or erythema, pharynx normal Oral cavity: MMM, no lesions Neck: supple, no lymphadenopathy, no thyromegaly, no masses Heart: RRR, normal S1, S2, 2/6 systolic murmur Lungs: Bilateral rhonci, no distress Abdomen: +bs, soft, obese non tender, non distended, no masses, no hepatomegaly Musculoskeletal: nontender, no swelling,  no obvious deformity Extremities: 1-2+ edema, no cyanosis, no clubbing Pulses: 2+ symmetric upper , decreased bilateral lower extremities, normal cap refill Neurological: alert, oriented x 3, CN2-12 intact, strength 4/5 upper extremities and lower extremities, sensation normal throughout, DTRs 2+ throughout, no cerebellar signs, gait analgic Psychiatric: normal affect, behavior normal, pleasant   Medicare Attestation I have personally reviewed: The patient's medical and social history Their use of alcohol, tobacco or illicit drugs Their current medications and supplements The patient's functional ability including ADLs,fall risks, home safety risks, cognitive, and hearing and visual impairment Diet and physical activities Evidence for depression or mood disorders  The patient's weight, height, BMI, and visual acuity have been recorded in the chart.  I have made referrals, counseling, and provided education to the patient based on review of the above and I have provided the patient with a written personalized care plan for preventive services.     Vicie Mutters, PA-C   02/15/2014

## 2014-02-15 NOTE — Patient Instructions (Signed)
Okay to take the claritin/loratadine if you need to for sinuses.   Preventative Care for Adults, Male       REGULAR HEALTH EXAMS:  A routine yearly physical is a good way to check in with your primary care provider about your health and preventive screening. It is also an opportunity to share updates about your health and any concerns you have, and receive a thorough all-over exam.   Most health insurance companies pay for at least some preventative services.  Check with your health plan for specific coverages.  WHAT PREVENTATIVE SERVICES DO MEN NEED?  Adult men should have their weight and blood pressure checked regularly.   Men age 1 and older should have their cholesterol levels checked regularly.  Beginning at age 8 and continuing to age 77, men should be screened for colorectal cancer.  Certain people should may need continued testing until age 19.  Other cancer screening may include exams for testicular and prostate cancer.  Updating vaccinations is part of preventative care.  Vaccinations help protect against diseases such as the flu.  Lab tests are generally done as part of preventative care to screen for anemia and blood disorders, to screen for problems with the kidneys and liver, to screen for bladder problems, to check blood sugar, and to check your cholesterol level.  Preventative services generally include counseling about diet, exercise, avoiding tobacco, drugs, excessive alcohol consumption, and sexually transmitted infections.    GENERAL RECOMMENDATIONS FOR GOOD HEALTH:  Healthy diet:  Eat a variety of foods, including fruit, vegetables, animal or vegetable protein, such as meat, fish, chicken, and eggs, or beans, lentils, tofu, and grains, such as rice.  Drink plenty of water daily.  Decrease saturated fat in the diet, avoid lots of red meat, processed foods, sweets, fast foods, and fried foods.  Exercise:  Aerobic exercise helps maintain good heart health.  At least 30-40 minutes of moderate-intensity exercise is recommended. For example, a brisk walk that increases your heart rate and breathing. This should be done on most days of the week.   Find a type of exercise or a variety of exercises that you enjoy so that it becomes a part of your daily life.  Examples are running, walking, swimming, water aerobics, and biking.  For motivation and support, explore group exercise such as aerobic class, spin class, Zumba, Yoga,or  martial arts, etc.    Set exercise goals for yourself, such as a certain weight goal, walk or run in a race such as a 5k walk/run.  Speak to your primary care provider about exercise goals.  Disease prevention:  If you smoke or chew tobacco, find out from your caregiver how to quit. It can literally save your life, no matter how long you have been a tobacco user. If you do not use tobacco, never begin.   Maintain a healthy diet and normal weight. Increased weight leads to problems with blood pressure and diabetes.   The Body Mass Index or BMI is a way of measuring how much of your body is fat. Having a BMI above 27 increases the risk of heart disease, diabetes, hypertension, stroke and other problems related to obesity. Your caregiver can help determine your BMI and based on it develop an exercise and dietary program to help you achieve or maintain this important measurement at a healthful level.  High blood pressure causes heart and blood vessel problems.  Persistent high blood pressure should be treated with medicine if weight loss and exercise  do not work.   Fat and cholesterol leaves deposits in your arteries that can block them. This causes heart disease and vessel disease elsewhere in your body.  If your cholesterol is found to be high, or if you have heart disease or certain other medical conditions, then you may need to have your cholesterol monitored frequently and be treated with medication.   Ask if you should have a  stress test if your history suggests this. A stress test is a test done on a treadmill that looks for heart disease. This test can find disease prior to there being a problem.  Avoid drinking alcohol in excess (more than two drinks per day).  Avoid use of street drugs. Do not share needles with anyone. Ask for professional help if you need assistance or instructions on stopping the use of alcohol, cigarettes, and/or drugs.  Brush your teeth twice a day with fluoride toothpaste, and floss once a day. Good oral hygiene prevents tooth decay and gum disease. The problems can be painful, unattractive, and can cause other health problems. Visit your dentist for a routine oral and dental check up and preventive care every 6-12 months.   Look at your skin regularly.  Use a mirror to look at your back. Notify your caregivers of changes in moles, especially if there are changes in shapes, colors, a size larger than a pencil eraser, an irregular border, or development of new moles.  Safety:  Use seatbelts 100% of the time, whether driving or as a passenger.  Use safety devices such as hearing protection if you work in environments with loud noise or significant background noise.  Use safety glasses when doing any work that could send debris in to the eyes.  Use a helmet if you ride a bike or motorcycle.  Use appropriate safety gear for contact sports.  Talk to your caregiver about gun safety.  Use sunscreen with a SPF (or skin protection factor) of 15 or greater.  Lighter skinned people are at a greater risk of skin cancer. Don't forget to also wear sunglasses in order to protect your eyes from too much damaging sunlight. Damaging sunlight can accelerate cataract formation.   Practice safe sex. Use condoms. Condoms are used for birth control and to help reduce the spread of sexually transmitted infections (or STIs).  Some of the STIs are gonorrhea (the clap), chlamydia, syphilis, trichomonas, herpes, HPV (human  papilloma virus) and HIV (human immunodeficiency virus) which causes AIDS. The herpes, HIV and HPV are viral illnesses that have no cure. These can result in disability, cancer and death.   Keep carbon monoxide and smoke detectors in your home functioning at all times. Change the batteries every 6 months or use a model that plugs into the wall.   Vaccinations:  Stay up to date with your tetanus shots and other required immunizations. You should have a booster for tetanus every 10 years. Be sure to get your flu shot every year, since 5%-20% of the [place] population comes down with the flu. The flu vaccine changes each year, so being vaccinated once is not enough. Get your shot in the fall, before the flu season peaks.   Other vaccines to consider:  Pneumococcal vaccine to protect against certain types of pneumonia.  This is normally recommended for adults age 12 or older.  However, adults younger than 75 years old with certain underlying conditions such as diabetes, heart or lung disease should also receive the vaccine.  Shingles vaccine to  protect against Varicella Zoster if you are older than age 62, or younger than 75 years old with certain underlying illness.  Hepatitis A vaccine to protect against a form of infection of the liver by a virus acquired from food.  Hepatitis B vaccine to protect against a form of infection of the liver by a virus acquired from blood or body fluids, particularly if you work in health care.  If you plan to travel internationally, check with your local health department for specific vaccination recommendations.  Cancer Screening:  Most routine colon cancer screening begins at the age of 77. On a yearly basis, doctors may provide special easy to use take-home tests to check for hidden blood in the stool. Sigmoidoscopy or colonoscopy can detect the earliest forms of colon cancer and is life saving. These tests use a small camera at the end of a tube to directly examine  the colon. Speak to your caregiver about this at age 67, when routine screening begins (and is repeated every 5 years unless early forms of pre-cancerous polyps or small growths are found).   At the age of 26 men usually start screening for prostate cancer every year. Screening may begin at a younger age for those with higher risk. Those at higher risk include African-Americans or having a family history of prostate cancer. There are two types of tests for prostate cancer:   Prostate-specific antigen (PSA) testing. Recent studies raise questions about prostate cancer using PSA and you should discuss this with your caregiver.   Digital rectal exam (in which your doctor's lubricated and gloved finger feels for enlargement of the prostate through the anus).   Screening for testicular cancer.  Do a monthly exam of your testicles. Gently roll each testicle between your thumb and fingers, feeling for any abnormal lumps. The best time to do this is after a hot shower or bath when the tissues are looser. Notify your caregivers of any lumps, tenderness or changes in size or shape immediately.

## 2014-02-16 LAB — CBC WITH DIFFERENTIAL/PLATELET
BASOS ABS: 0.1 10*3/uL (ref 0.0–0.1)
Basophils Relative: 1 % (ref 0–1)
EOS PCT: 5 % (ref 0–5)
Eosinophils Absolute: 0.6 10*3/uL (ref 0.0–0.7)
HCT: 43.9 % (ref 39.0–52.0)
Hemoglobin: 15.5 g/dL (ref 13.0–17.0)
LYMPHS PCT: 20 % (ref 12–46)
Lymphs Abs: 2.6 10*3/uL (ref 0.7–4.0)
MCH: 29.2 pg (ref 26.0–34.0)
MCHC: 35.3 g/dL (ref 30.0–36.0)
MCV: 82.8 fL (ref 78.0–100.0)
Monocytes Absolute: 2.1 10*3/uL — ABNORMAL HIGH (ref 0.1–1.0)
Monocytes Relative: 16 % — ABNORMAL HIGH (ref 3–12)
NEUTROS PCT: 58 % (ref 43–77)
Neutro Abs: 7.5 10*3/uL (ref 1.7–7.7)
Platelets: 376 10*3/uL (ref 150–400)
RBC: 5.3 MIL/uL (ref 4.22–5.81)
RDW: 14.5 % (ref 11.5–15.5)
WBC: 12.9 10*3/uL — AB (ref 4.0–10.5)

## 2014-02-16 LAB — BASIC METABOLIC PANEL WITHOUT GFR
BUN: 15 mg/dL (ref 6–23)
CO2: 28 meq/L (ref 19–32)
Calcium: 9.8 mg/dL (ref 8.4–10.5)
Chloride: 99 meq/L (ref 96–112)
Creat: 0.91 mg/dL (ref 0.50–1.35)
GFR, Est African American: >89 mL/min
GFR, Est Non African American: 82 mL/min
Glucose, Bld: 101 mg/dL — ABNORMAL HIGH (ref 70–99)
Potassium: 4.8 meq/L (ref 3.5–5.3)
Sodium: 137 meq/L (ref 135–145)

## 2014-02-16 LAB — HEPATIC FUNCTION PANEL
ALBUMIN: 4.2 g/dL (ref 3.5–5.2)
ALT: 21 U/L (ref 0–53)
AST: 16 U/L (ref 0–37)
Alkaline Phosphatase: 47 U/L (ref 39–117)
BILIRUBIN DIRECT: 0.1 mg/dL (ref 0.0–0.3)
Indirect Bilirubin: 0.8 mg/dL (ref 0.2–1.2)
Total Bilirubin: 0.9 mg/dL (ref 0.2–1.2)
Total Protein: 6.8 g/dL (ref 6.0–8.3)

## 2014-02-16 LAB — HEMOGLOBIN A1C
Hgb A1c MFr Bld: 6.7 % — ABNORMAL HIGH
Mean Plasma Glucose: 146 mg/dL — ABNORMAL HIGH

## 2014-02-16 LAB — LIPID PANEL
CHOL/HDL RATIO: 4.5 ratio
CHOLESTEROL: 113 mg/dL (ref 0–200)
HDL: 25 mg/dL — AB (ref >39)
LDL Cholesterol: 54 mg/dL (ref 0–99)
Triglycerides: 169 mg/dL — ABNORMAL HIGH (ref <150)
VLDL: 34 mg/dL (ref 0–40)

## 2014-02-16 LAB — MAGNESIUM: Magnesium: 1.6 mg/dL (ref 1.5–2.5)

## 2014-02-16 LAB — VITAMIN D 25 HYDROXY (VIT D DEFICIENCY, FRACTURES): VIT D 25 HYDROXY: 64 ng/mL (ref 30–89)

## 2014-02-16 LAB — TSH: TSH: 2.503 u[IU]/mL (ref 0.350–4.500)

## 2014-03-13 ENCOUNTER — Encounter: Payer: Self-pay | Admitting: Physician Assistant

## 2014-03-15 ENCOUNTER — Encounter: Payer: Self-pay | Admitting: Internal Medicine

## 2014-04-22 ENCOUNTER — Other Ambulatory Visit: Payer: Self-pay | Admitting: Internal Medicine

## 2014-04-22 NOTE — Telephone Encounter (Signed)
No refill without office visit

## 2014-05-18 ENCOUNTER — Other Ambulatory Visit: Payer: Self-pay | Admitting: Internal Medicine

## 2014-05-21 ENCOUNTER — Other Ambulatory Visit: Payer: Self-pay | Admitting: Emergency Medicine

## 2014-05-21 MED ORDER — ATORVASTATIN CALCIUM 40 MG PO TABS: ORAL_TABLET | ORAL | Status: DC

## 2014-05-21 MED ORDER — METOPROLOL TARTRATE 25 MG PO TABS: 25.0000 mg | ORAL_TABLET | Freq: Two times a day (BID) | ORAL | Status: DC

## 2014-06-07 ENCOUNTER — Other Ambulatory Visit: Payer: Self-pay | Admitting: *Deleted

## 2014-06-07 ENCOUNTER — Ambulatory Visit: Payer: Medicare Other | Admitting: Internal Medicine

## 2014-06-07 ENCOUNTER — Encounter: Payer: Self-pay | Admitting: Internal Medicine

## 2014-06-07 VITALS — BP 120/66 | HR 76 | Temp 97.9°F | Resp 16 | Ht 70.0 in | Wt 210.8 lb

## 2014-06-07 DIAGNOSIS — E559 Vitamin D deficiency, unspecified: Secondary | ICD-10-CM

## 2014-06-07 DIAGNOSIS — E782 Mixed hyperlipidemia: Secondary | ICD-10-CM

## 2014-06-07 DIAGNOSIS — E669 Obesity, unspecified: Secondary | ICD-10-CM

## 2014-06-07 DIAGNOSIS — Z79899 Other long term (current) drug therapy: Secondary | ICD-10-CM

## 2014-06-07 DIAGNOSIS — E1122 Type 2 diabetes mellitus with diabetic chronic kidney disease: Secondary | ICD-10-CM

## 2014-06-07 DIAGNOSIS — I10 Essential (primary) hypertension: Secondary | ICD-10-CM

## 2014-06-07 DIAGNOSIS — N182 Chronic kidney disease, stage 2 (mild): Secondary | ICD-10-CM

## 2014-06-07 MED ORDER — CLOBETASOL PROPIONATE 0.05 % EX OINT: TOPICAL_OINTMENT | CUTANEOUS | Status: DC

## 2014-06-07 NOTE — Progress Notes (Signed)
Patient ID: [name], male   DOB: Sep 30, 1940, 74 y.o.   MRN: [id number]  R [facility]

## 2014-06-08 ENCOUNTER — Telehealth: Payer: Self-pay | Admitting: Internal Medicine

## 2014-06-08 LAB — BASIC METABOLIC PANEL WITH GFR
BUN: 16 mg/dL (ref 6–23)
CO2: 26 mEq/L (ref 19–32)
Calcium: 9.9 mg/dL (ref 8.4–10.5)
Chloride: 101 mEq/L (ref 96–112)
Creat: 0.8 mg/dL (ref 0.50–1.35)
GFR, EST NON AFRICAN AMERICAN: 87 mL/min
Glucose, Bld: 131 mg/dL — ABNORMAL HIGH (ref 70–99)
POTASSIUM: 5.2 meq/L (ref 3.5–5.3)
Sodium: 137 mEq/L (ref 135–145)

## 2014-06-08 LAB — CBC WITH DIFFERENTIAL/PLATELET
Basophils Absolute: 0.2 10*3/uL — ABNORMAL HIGH (ref 0.0–0.1)
Basophils Relative: 1 % (ref 0–1)
EOS PCT: 6 % — AB (ref 0–5)
Eosinophils Absolute: 0.9 10*3/uL — ABNORMAL HIGH (ref 0.0–0.7)
HEMATOCRIT: 45.9 % (ref 39.0–52.0)
Hemoglobin: 16.1 g/dL (ref 13.0–17.0)
Lymphocytes Relative: 23 % (ref 12–46)
Lymphs Abs: 3.5 10*3/uL (ref 0.7–4.0)
MCH: 29.1 pg (ref 26.0–34.0)
MCHC: 35.1 g/dL (ref 30.0–36.0)
MCV: 82.9 fL (ref 78.0–100.0)
MONOS PCT: 14 % — AB (ref 3–12)
MPV: 10.6 fL (ref 8.6–12.4)
Monocytes Absolute: 2.1 10*3/uL — ABNORMAL HIGH (ref 0.1–1.0)
NEUTROS ABS: 8.4 10*3/uL — AB (ref 1.7–7.7)
Neutrophils Relative %: 56 % (ref 43–77)
Platelets: 388 10*3/uL (ref 150–400)
RBC: 5.54 MIL/uL (ref 4.22–5.81)
RDW: 15.2 % (ref 11.5–15.5)
WBC: 15 10*3/uL — AB (ref 4.0–10.5)

## 2014-06-08 LAB — LIPID PANEL
CHOLESTEROL: 134 mg/dL (ref 0–200)
HDL: 27 mg/dL — ABNORMAL LOW (ref >39)
LDL Cholesterol: 67 mg/dL (ref 0–99)
TRIGLYCERIDES: 198 mg/dL — AB (ref <150)
Total CHOL/HDL Ratio: 5 Ratio
VLDL: 40 mg/dL (ref 0–40)

## 2014-06-08 LAB — HEMOGLOBIN A1C
HEMOGLOBIN A1C: 7.4 % — AB (ref <5.7)
Mean Plasma Glucose: 166 mg/dL — ABNORMAL HIGH (ref <117)

## 2014-06-08 LAB — HEPATIC FUNCTION PANEL
ALBUMIN: 4.2 g/dL (ref 3.5–5.2)
ALK PHOS: 55 U/L (ref 39–117)
ALT: 27 U/L (ref 0–53)
AST: 19 U/L (ref 0–37)
Bilirubin, Direct: 0.1 mg/dL (ref 0.0–0.3)
Indirect Bilirubin: 0.6 mg/dL (ref 0.2–1.2)
TOTAL PROTEIN: 6.8 g/dL (ref 6.0–8.3)
Total Bilirubin: 0.7 mg/dL (ref 0.2–1.2)

## 2014-06-08 LAB — MAGNESIUM: MAGNESIUM: 1.5 mg/dL (ref 1.5–2.5)

## 2014-06-08 LAB — TSH: TSH: 2.2 u[IU]/mL (ref 0.350–4.500)

## 2014-06-08 LAB — INSULIN, FASTING: Insulin fasting, serum: 9.4 u[IU]/mL (ref 2.0–19.6)

## 2014-06-08 LAB — VITAMIN D 25 HYDROXY (VIT D DEFICIENCY, FRACTURES): Vit D, 25-Hydroxy: 34 ng/mL (ref 30–100)

## 2014-06-08 NOTE — Telephone Encounter (Signed)
patient said he will call back to sch 15mth-(had to leave without being seen)  he depends on other people for transportation.  Thank you, Katrina Judeth Horn [facility] [phone] Fax [phone]

## 2014-07-12 ENCOUNTER — Encounter: Payer: Self-pay | Admitting: Physician Assistant

## 2014-07-17 ENCOUNTER — Other Ambulatory Visit: Payer: Self-pay | Admitting: Physician Assistant

## 2014-08-06 ENCOUNTER — Encounter: Payer: Self-pay | Admitting: Internal Medicine

## 2014-08-06 ENCOUNTER — Other Ambulatory Visit: Payer: Self-pay | Admitting: Internal Medicine

## 2014-08-06 ENCOUNTER — Ambulatory Visit (INDEPENDENT_AMBULATORY_CARE_PROVIDER_SITE_OTHER): Payer: Medicare Other | Admitting: Internal Medicine

## 2014-08-06 VITALS — BP 116/72 | HR 84 | Temp 97.9°F | Resp 16 | Ht 70.0 in | Wt 208.7 lb

## 2014-08-06 DIAGNOSIS — E782 Mixed hyperlipidemia: Secondary | ICD-10-CM

## 2014-08-06 DIAGNOSIS — Z79899 Other long term (current) drug therapy: Secondary | ICD-10-CM

## 2014-08-06 DIAGNOSIS — E559 Vitamin D deficiency, unspecified: Secondary | ICD-10-CM

## 2014-08-06 DIAGNOSIS — I10 Essential (primary) hypertension: Secondary | ICD-10-CM

## 2014-08-06 DIAGNOSIS — H548 Legal blindness, as defined in USA: Secondary | ICD-10-CM

## 2014-08-06 DIAGNOSIS — E1122 Type 2 diabetes mellitus with diabetic chronic kidney disease: Secondary | ICD-10-CM

## 2014-08-06 DIAGNOSIS — E09311 Drug or chemical induced diabetes mellitus with unspecified diabetic retinopathy with macular edema: Secondary | ICD-10-CM | POA: Insufficient documentation

## 2014-08-06 LAB — HEPATIC FUNCTION PANEL
ALBUMIN: 4.3 g/dL (ref 3.5–5.2)
ALT: 25 U/L (ref 0–53)
AST: 16 U/L (ref 0–37)
Alkaline Phosphatase: 58 U/L (ref 39–117)
BILIRUBIN DIRECT: 0.1 mg/dL (ref 0.0–0.3)
BILIRUBIN TOTAL: 0.7 mg/dL (ref 0.2–1.2)
Indirect Bilirubin: 0.6 mg/dL (ref 0.2–1.2)
Total Protein: 6.5 g/dL (ref 6.0–8.3)

## 2014-08-06 LAB — BASIC METABOLIC PANEL WITH GFR
BUN: 17 mg/dL (ref 6–23)
CO2: 27 mEq/L (ref 19–32)
Calcium: 9.6 mg/dL (ref 8.4–10.5)
Chloride: 100 mEq/L (ref 96–112)
Creat: 0.96 mg/dL (ref 0.50–1.35)
GFR, EST AFRICAN AMERICAN: 88 mL/min
GFR, EST NON AFRICAN AMERICAN: 76 mL/min
GLUCOSE: 180 mg/dL — AB (ref 70–99)
Potassium: 4.6 mEq/L (ref 3.5–5.3)
SODIUM: 138 meq/L (ref 135–145)

## 2014-08-06 LAB — LIPID PANEL
Cholesterol: 139 mg/dL (ref 0–200)
HDL: 22 mg/dL — ABNORMAL LOW (ref >=40)
LDL CALC: 60 mg/dL (ref 0–99)
TRIGLYCERIDES: 287 mg/dL — AB (ref <150)
Total CHOL/HDL Ratio: 6.3 Ratio
VLDL: 57 mg/dL — AB (ref 0–40)

## 2014-08-06 LAB — MAGNESIUM: Magnesium: 1.6 mg/dL (ref 1.5–2.5)

## 2014-08-06 LAB — TSH: TSH: 2.968 u[IU]/mL (ref 0.350–4.500)

## 2014-08-06 NOTE — Patient Instructions (Signed)

## 2014-08-06 NOTE — Progress Notes (Signed)
Patient ID: [name], male   DOB: [birth year], 74 y.o.   MRN: [id number]   This very nice 74 y.o. DWM presents for 3 month follow up with Hypertension , Hyperlipidemia, Pre-Diabetes and Vitamin D Deficiency. 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 He is able to detect hand motion and count fingers, but is not able to read or drive.    Patient is treated for HTN since 2012 & BP has been controlled and today's BP: 116/72 mmHg. Patient has had no complaints of any cardiac type chest pain, palpitations, dyspnea/orthopnea/PND, dizziness, claudication, or dependent edema.   Hyperlipidemia is controlled with diet & meds. Patient denies myalgias or other med SE's. Last Lipids were at goal - Total  Chol 134; HDL 27; LDL  67; Trig 198 on 06/07/2014.   Also, the patient has history of T2_NIDDM since 2007 and has had no symptoms of reactive hypoglycemia, diabetic polys, paresthesias and as above he is severely visually impaired.  Thus , he is unable to use a conventional glucometer and is attempting to obtain a talking glucometer. Last A1c was  7.4% on  06/07/2014.   Further, the patient also has history of Vitamin D Deficiency and supplements vitamin D without any suspected side-effects. Last vitamin D was still severely low at  34 on  06/07/2014.  Medication Sig  . aspirin EC 81 MG tab Take 81 mg by mouth daily.    Marland Kitchen atorvastatin 40 MG tab TAKE 1 TABLET EVERY DAY FOR CHOLESTEROL  . benazepril  40 MG tab TAKE 1 TABLET BY MOUTH EVERY DAY  . OSCAL WITH D 500-200 MG-UNIT  Take 1 tablet by mouth daily.    . clobetasol ointment (TEMOVATE) 0.05 % Apply to eczema rash 2-3 x/da  . FLAXSEED OIL 1000 MG  Take 1,000 mg by mouth daily.  . Fluticasone Furoate-Vilanterol 100-25  Do 1-2 inhalations once daily  . guaiFENesin (MUCINEX) 600 MG 12 hr tabl Take 1,200 mg by mouth 2 (two) times daily.    Lebron Quam 5-325  Take 1 tablet by mouth every 6 (six) hours as needed for moderate pain (for  cough).  . metFORMIN  500 MG tab TAKE 1 TABLET BY MOUTH TWICE A DAY WITH A MEAL  . metoprolol tartrate  25 MG tab Take 1 tablet (25 mg total) by mouth 2 (two) times daily.  Marland Kitchen tiotropium (SPIRIVA)  cap Place 18 mcg into inhaler and inhale daily.    Allergies  Allergen Reactions  . Crestor [Rosuvastatin]   . Latex Itching  . Neomycin-Bacitracin Zn-Polymyx Other (See Comments)    Causes blisters  . Ciprofloxacin Rash   PMHx:   Past Medical History  Diagnosis Date  . COPD (chronic obstructive pulmonary disease)   . Emphysema   . Hypertension   . Diabetes mellitus   . Hypercholesteremia    Immunization History  Administered Date(s) Administered  . Pneumococcal-Unspecified 04/27/2001  . Td 04/27/2002   Past Surgical History  Procedure Laterality Date  . Bladder repair    . Cholecystectomy    . Splenectomy, total     FHx:    Reviewed / unchanged  SHx:    Reviewed / unchanged  Systems Review:  Constitutional: Denies fever, chills, wt changes, headaches, insomnia, fatigue, night sweats, change in appetite. Eyes: Denies redness, blurred vision, diplopia, discharge, itchy, watery eyes.  ENT: Denies discharge, congestion, post nasal drip, epistaxis, sore throat, earache, hearing loss, dental pain, tinnitus, vertigo, sinus pain, snoring.  CV:  Denies chest pain, palpitations, irregular heartbeat, syncope, dyspnea, diaphoresis, orthopnea, PND, claudication or edema. Respiratory: denies cough, dyspnea, DOE, pleurisy, hoarseness, laryngitis, wheezing.  Gastrointestinal: Denies dysphagia, odynophagia, heartburn, reflux, water brash, abdominal pain or cramps, nausea, vomiting, bloating, diarrhea, constipation, hematemesis, melena, hematochezia  or hemorrhoids. Genitourinary: Denies dysuria, frequency, urgency, nocturia, hesitancy, discharge, hematuria or flank pain. Musculoskeletal: Denies arthralgias, myalgias, stiffness, jt. swelling, pain, limping or strain/sprain.  Skin: Denies  pruritus, rash, hives, warts, acne, eczema or change in skin lesion(s). Neuro: No weakness, tremor, incoordination, spasms, paresthesia or pain. Psychiatric: Denies confusion, memory loss or sensory loss. Endo: Denies change in weight, skin or hair change.  Heme/Lymph: No excessive bleeding, bruising or enlarged lymph nodes.  Physical Exam  BP 116/72   Pulse 84  Temp 97.9 F  Resp 16  Ht 5\' 10"    Wt 208 lb 11oz     BMI 29.95   Appears well nourished and in no distress. Eyes: PERRLA, EOMs, conjunctiva no swelling or erythema. Sinuses: No frontal/maxillary tenderness ENT/Mouth: EAC's clear, TM's nl w/o erythema, bulging. Nares clear w/o erythema, swelling, exudates. Oropharynx clear without erythema or exudates. Oral hygiene is good. Tongue normal, non obstructing. Hearing intact.  Neck: Supple. Thyroid nl. Car 2+/2+ without bruits, nodes or JVD. Chest: Respirations nl with BS clear & equal w/o rales, rhonchi, wheezing or stridor.  Cor: Heart sounds normal w/ regular rate and rhythm without sig. murmurs, gallops, clicks, or rubs. Peripheral pulses normal and equal  without edema.  Abdomen: Soft & bowel sounds normal. Non-tender w/o guarding, rebound, hernias, masses, or organomegaly.  Lymphatics: Unremarkable.  Musculoskeletal: Full ROM all peripheral extremities, joint stability, 5/5 strength, and normal gait.  Skin: Warm, dry without exposed rashes, lesions or ecchymosis apparent.  Neuro: Cranial nerves intact, reflexes equal bilaterally. Sensory-motor testing grossly intact. Tendon reflexes grossly intact.  Pysch: Alert & oriented x 3.  Insight and judgement nl & appropriate. No ideations.  Assessment and Plan:  1. Essential hypertension  - TSH  2. Hyperlipidemia  - Lipid panel  3. Type 2 diabetes mellitus with diabetic chronic kidney disease  - Hemoglobin A1c - Insulin, random  4. Vitamin D deficiency  - Vit D  25 hydroxy (rtn osteoporosis monitoring)  5. Encounter  for long-term (current) use of medications  - CBC with Differential/Platelet - BASIC METABOLIC PANEL WITH GFR - Hepatic function panel - Magnesium   Recommended regular exercise, BP monitoring, weight control, and discussed med and SE's. Recommended labs to assess and monitor clinical status. Further disposition pending results of labs. Over 30 minutes of exam, counseling, chart review was performed

## 2014-08-07 LAB — CBC WITH DIFFERENTIAL/PLATELET
Basophils Absolute: 0.1 10*3/uL (ref 0.0–0.1)
Basophils Relative: 1 % (ref 0–1)
Eosinophils Absolute: 1 10*3/uL — ABNORMAL HIGH (ref 0.0–0.7)
Eosinophils Relative: 7 % — ABNORMAL HIGH (ref 0–5)
HCT: 45.7 % (ref 39.0–52.0)
Hemoglobin: 16.3 g/dL (ref 13.0–17.0)
LYMPHS PCT: 23 % (ref 12–46)
Lymphs Abs: 3.4 10*3/uL (ref 0.7–4.0)
MCH: 30.4 pg (ref 26.0–34.0)
MCHC: 35.7 g/dL (ref 30.0–36.0)
MCV: 85.1 fL (ref 78.0–100.0)
MPV: 10.7 fL (ref 8.6–12.4)
Monocytes Absolute: 1.8 10*3/uL — ABNORMAL HIGH (ref 0.1–1.0)
Monocytes Relative: 12 % (ref 3–12)
NEUTROS ABS: 8.4 10*3/uL — AB (ref 1.7–7.7)
Neutrophils Relative %: 57 % (ref 43–77)
Platelets: 349 10*3/uL (ref 150–400)
RBC: 5.37 MIL/uL (ref 4.22–5.81)
RDW: 15.6 % — ABNORMAL HIGH (ref 11.5–15.5)
WBC: 14.7 10*3/uL — AB (ref 4.0–10.5)

## 2014-08-07 LAB — VITAMIN D 25 HYDROXY (VIT D DEFICIENCY, FRACTURES): Vit D, 25-Hydroxy: 37 ng/mL (ref 30–100)

## 2014-08-07 LAB — HEMOGLOBIN A1C
Hgb A1c MFr Bld: 7.6 % — ABNORMAL HIGH (ref <5.7)
Mean Plasma Glucose: 171 mg/dL — ABNORMAL HIGH (ref <117)

## 2014-08-07 LAB — INSULIN, RANDOM: Insulin: 17 u[IU]/mL (ref 2.0–19.6)

## 2014-08-18 NOTE — Consult Note (Signed)
PATIENT NAME:  [name], [name] MR#:  [id number] DATE OF BIRTH:  [birth year]  DATE OF CONSULTATION:  07/19/2013  REFERRING PHYSICIAN:  Theodoro Grist, MD CONSULTING PHYSICIAN:  Herbon E. Raul Del, MD    REASON FOR CONSULTATION: Evaluate for COPD and bronchitis.   HISTORY OF PRESENT ILLNESS: [name] is a 74 year old gentleman, 70 inches tall, 198 pounds, known to have COPD, presents to [place] on 07/18/2013 through the Emergency Room complaining of increased shortness of breath and increased lethargy. He is known to have multiple medical problems as listed below. Up to Saturday of last week, apparently he was doing fine. Then on Sunday, he started having shortness of breath with a cough and progressively became worse and worse. As a result of this, he was brought to the Emergency Room to  [facility]. In walk-in clinic, the patient was seen and was extremely short of breath and lethargic, so he was sent to [facility].   In the ER, he was noted to be lethargic. A venous blood sample was drawn, and his CO2 on that sample was 72, a pH of 7.26; hence, he was placed on BiPAP. He was transferred to  telemetry where I saw him. At the time I had seen him, he had received Lasix, increased urine output, subsequently seemed as if his breathing improved; he was off BiPAP on nasal cannula O2.   Presently, he said he is breathing much better. No chest pain, palpitations. No leg pain or calf pain. Minimal wheezing. No chest tightness.   PAST MEDICAL HISTORY: COPD, hypertension, diabetes, macular degenerative disease, obesity.   PAST SURGICAL HISTORY: None.   ALLERGIES: CIPRO, CRESTOR, NEOSPORIN, LATEX.   MEDICATIONS ON ADMISSION: Spiriva 1cap q day; omega 3 at 1000 units daily; metformin 500 mg b.i.d.; warfarin extended release 2 tablets two times a day; calcium plus vitamin D 500 plus 200 mg one tablet daily; benazepril 40 mg daily; atorvastatin 10 mg daily; aspirin 81 mg daily; Lortab 1 tablet  q. 6 hours p.r.n.   SOCIAL HISTORY: Lives in [place]. Chews tobacco; there was a prior smoking history. Smokes less than 4 or 5 cigarettes per day when he does smoke. No alcohol.   FAMILY HISTORY: Noted for coronary artery disease primarily.  Marland Kitchen REVIEW OF SYSTEMS: No headache, dizziness, passing out spells, choking spells, chest pain or  having any tightness with pleurisy, GERD, significant postnasal dripping, nosebleeds, change in voice, swallowing difficulty, nor is he having any parotid gland swelling or swelling in the neck. There were no other GI/GU symptoms today. No decubitus, no bruising, bleeding, nor is he having any TIA seizures, passing out spells, no suicidial ideation today.   PHYSICAL EXAMINATION:  VITAL SIGNS: Temperature 97.8, pulse 85, respirations 18, blood pressure 120/61, oxygen saturation 97% on 2 liters.  GENERAL: A gentleman lying in bed, able to speak in full sentences. Nasal cannula O2 on. Good color noticed also at the bedside.  HEENT: Normocephalic, nontraumatic.  Sclerae clear. Normal  sinuses. Oropharynx somewhat narrowed. No thrush.  NECK: Supple, somewhat thick. Trachea midline. No stridor.  LUNGS: Bilateral breath sounds with crackles and late expiratory wheezes, but not tight.  CARDIAC: Regular rhythm and rate. Did not hear any loud murmurs.  ABDOMEN: Soft, nontender, somewhat obese.  EXTREMITIES: Trace edema. No Homans sign.  SKIN: Fair turgor. No rashes. HEMATOLOGIC, LYMPHATIC: No nodes in neck or supraclavicular area.  NEUROLOGIC: Did not ambulate but able to raise extremities against gravity.  PSYCHIATRIC: Reasonable historian. Alert. Follows simple  commands.   LABORATORY, DIAGNOSTIC, AND RADIOLOGICAL DATA: Chest CT showed emphysematous changes. There was also chronic obstruction of the anterior segment of the right upper lobe bronchus with postobstructive airway opacification and bronchial dilation. 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 Bronchial  obstruction has been there since 03/14/2011, although this chest x-ray seemed to be more pronounced. There were no other enlarging or suspicious pulmonary nodules. No significant mediastinal adenopathy.   Chest x-ray today showed cardiomegaly with pulmonary venous congestion, hyperinflation consistent with COPD, as well as branching tubular opacities in the right upper lung extending down to the right hilum.   IMPRESSION: This is a pleasant 74 year old gentleman who seemed to be much improved since being admitted with impending hypercapnic, hypoxic respiratory failure, probably due mostly to his chronic obstructive pulmonary disease as well as underlying pulmonary edema/congestive heart failure. Those issues are being addressed, and I agree with the present plan.   The question that is still yet to be resolved is what do we do about the right upper lobe changes. Certainly seem to have an endobroncial lesion, post obstructive changes, ? bronchiectasis. This has been present since 2012; if it is a malignancy, this is quite slow growing. Hence, there is no urgency at this point to biopsy it. In light of the fact that he is still having difficulty with respiration, would like to improve his respitory status first and then consider moving on to diagnostic bronchoscopy. Will discuss with our bronchoscopy team. We will reassess on 07/20/2013.   Lastly, he is on aspirin.  hence, he will have to be off at least for 4 to 6 days. Hence, the bronchoscopy would definitely not be able to be done during this week.   ____________________________ Herbon E. Raul Del, MD hef:np D: 07/20/2013 16:35:00 ET T: 07/20/2013 18:18:32 ET JOB#: [id number]  cc: Herbon E. Raul Del, MD, <Dictator>    Erby Pian MD ELECTRONICALLY SIGNED 07/25/2013 8:22

## 2014-08-18 NOTE — Consult Note (Signed)
PATIENT NAME:  [name], [name] MR#:  [id number] DATE OF BIRTH:  09/13/1940  DATE OF CONSULTATION:  07/20/2013  REFERRING PHYSICIAN:  Dr. Melford Aase in [place] and also referred by Dr. Anselm Jungling with [facility]. CONSULTING PHYSICIAN:  Shannin Naab D. Asencion Guisinger, MD  CHIEF COMPLAINT:  Shortness of breath, weakness.   HISTORY OF PRESENT ILLNESS:  The patient is a 74 year old male with past history of COPD, macular degeneration, diabetes, hypertension who lives alone and was managing COPD with nebulizers but no home 02.  The patient was seen on Sunday last week and states that he started feeling poorly for 5 days.  He has 3 or 4 day's worth of weakness, fatigue, shortness of breath.  By Sunday he started to feel worse, was advised to come to the hospital, but did not want to come at that time, had cough.  The patient did not feel like doing anything and hung around the house and got progressively worse with worsening shortness of breath, weakness, fatigue.  He finally came to [facility] and after evaluation was found to be in respiratory failure with Afib and bronchitis and then was sent to the ER for further evaluation and subsequently was admitted.  He seemed short of breath and lethargic in the Emergency Room and he had a venous blood sample with elevated CO2.  He was started on BiPAP, then inhalers which did not seem like they helped much, so subsequently was advised to be admitted.   REVIEW OF SYSTEMS:  Weakness, fatigue, lethargy, shortness of breath, dyspnea, cough, minimal sputum, mild low-grade temp.  No significant sweating.  No nausea, vomiting.  No fever, no chills. No weight loss, no weight gain.  No hemoptysis or hematemesis.  No bright red blood per rectum.   PAST MEDICAL HISTORY:  COPD, hypertension, diabetes, macular degeneration, mild obesity.   PAST SURGICAL HISTORY:  None.   SOCIAL HISTORY:  Lives alone in [place].  Chews tobacco.  Chronic smoker, trying to cut down.   FAMILY  HISTORY:  Father died in his 70s.  Mother died in her 36s, cardiac issues.  Sister also has cardiac problems.   MEDICATIONS:  Tylenol with Codeine p.r.n., aspirin 81 mg a day, atorvastatin 10 mg a day, Benazepril 40 mg a day, calcium p.r.n., Coumadin, metformin 500 mg 2 tablets a day, Omega-3, Spiriva 18 mcg a day.    PHYSICAL EXAMINATION:  VITAL SIGNS:  Blood pressure 144/83, pulse of 110, respiratory rate of 20, afebrile.  HEENT:  Normocephalic, atraumatic.  Pupils equal, round, reactive to light.  NECK:  Supple.  No significant JVD, bruits, or adenopathy.  LUNGS:  Clear to auscultation and percussion.  He has some rhonchi, but no wheezing noted. HEART:  Tachycardic.  Systolic ejection murmur, left sternal border.  PMI nondisplaced.  ABDOMEN:  Benign.  EXTREMITIES:  Within normal limits.  NEUROLOGIC:  Intact. SKIN:  Normal.  LABORATORY AND DIAGNOSTIC STUDIES:  BNP 1676, glucose 109, BUN 13, creatinine 0.93, sodium 137, potassium 4.4, chloride 102, CO2 of 32, calcium 8.8, troponin 8.1.  LFTs negative. Troponin 0.05. White count 19.7, hemoglobin 15.9, platelet count of 409, MCV 84.  Venous blood sample shows pH of 7.26, pCO2 of 73.  Chest x-ray shows mild peribronchial thickening, slightly increased opacity right lung base, probable right upper lobe nodular opacity.   ASSESSMENT: 1.  Acute respiratory failure.  2.  Pneumonia.  3.  Chronic obstructive pulmonary disease.  4.  Hypertension.  5.  Diabetes.  6.  Obesity.  7.  Past history of smoking.   PLAN: 1.  Agree with admit.  Rule out for myocardial infarction.  Place on anticoagulation.   2. For respiratory failure, continue inhalers.  Continue steroids.   antibiotic therapy, inhalers.  Consider BiPAP and supplemental oxygen.  3.  Chronic obstructive pulmonary disease.  Continue respiratory therapy medication.  Consider pulmonary input.  4.  Hypertension, continue benazepril.  5.  Diabetes, continue current medication.     We will  continue to follow. Echocardiogram will be helpful.    ____________________________ Loran Senters. Clayborn Bigness, MD ddc:ea D: 07/20/2013 21:12:48 ET T: 07/20/2013 23:52:43 ET JOB#: [id number]  cc: Edilia Ghuman D. Clayborn Bigness, MD, <Dictator> Yolonda Kida MD ELECTRONICALLY SIGNED 08/08/2013 12:42

## 2014-08-18 NOTE — Discharge Summary (Signed)
Dates of Admission and Diagnosis:  Date of Admission 18-Jul-2013   Date of Discharge 21-Jul-2013   Admitting Diagnosis Respi failure due to Sepsis, COPD exacerbation.   Final Diagnosis COPD exacerbation- respi failure- required Bipap. A fib- new onset- need Anti coagulant- start after visiting Dr. Clayborn Bigness in clinic after lung mass biopsy. right lung mass- need biopsy- call Dr. Gust Brooms office on 07/24/13 to schedule appointment for next thursday directly for biopsy. DM Htn    Chief Complaint/History of Present Illness a 74 year old male with past medical history of COPD, macular degeneration, diabetes, hypertension, who lives alone and is managing his COPD with nebulizers but no home oxygen. Was last seen fine on Saturday last week. That was 3 days ago. Most likely from $RemoveBef"Sunday, he started having some shortness of breath and somewhat cough. Today, his daughter's [place]. The patient said he is not feeling well and so he went to his home and found him coughing and somewhat short of breath and did not look good, so he spoke to his daughter, who works in [employer] as a phlebotomist in [place] and brought the patient in car to  to [employer] in walk-in clinic.   The patient was seen over there and was extremely short of breath and was lethargic, so sent to the Emergency Room directly from there.   In the ER, he was found lethargic. His venous blood sample showed CO2 retention, and he was short of breath, so started on BiPAP for COPD management. Some treatment was given initially and then given hospitalist service for further management.   The patient is still somewhat lethargic and so history is most obtained from his daughter, who is present in the room, who is also healthcare power of attorney. On further questioning, she denies any sputum production, but he had fever and some dry cough. He used inhalers at home but did not help  much.   Allergies:  Neosporin: Rash  Cipro: Rash  Crestor: Unknown  Latex: Unknown  Hepatic:  24-Mar-15 18:23   Bilirubin, Total 0.6  Alkaline Phosphatase 75 (45-117 NOTE: New Reference Range 03/17/13)  SGPT (ALT) 26  SGOT (AST) 20  Total Protein, Serum 8.1  Albumin, Serum 4.0  Routine Micro:  24-Mar-15 18:23   Micro Text Report BLOOD CULTURE   COMMENT                   NO GROWTH AEROBICALLY/ANAEROBICALLY IN 5 DAYS   ANTIBIOTIC                       Culture Comment NO GROWTH AEROBICALLY/ANAEROBICALLY IN 5 DAYS  Result(s) reported on 23 Jul 2013 at 06:00PM.  Cardiology:  25-Mar-15 09:26   Echo Doppler REASON FOR EXAM:     COMMENTS:     PROCEDURE: ECH - ECHO DOPPLER COMPLETE(TRANSTHOR)  - Jul 19 2013  9:26AM   RESULT: Echocardiogram Report  Patient Name:   [name] Date of Exam: 07/19/2013 Medical Rec #:  [id number]      Custom1: Date of Birth:  08/17/1940   Height:       70.1 in Patient Age:    73 years    Weight:       19"VHHOMAXFSN$ 4.0 lb Patient Gender: M           BSA:          2.06 m??  Indications: CHF Sonographer:    Sonia Side  Hege RDCS Referring Phys: [name]  Summary:  1.Left ventricular ejection fraction, by visual estimation, is 55 to  60%.  2. Normal global left ventricular systolic function.  3. Mildly increased left ventricular septal thickness.  4. Moderately increased left ventricular posterior wall thickness. 2D AND M-MODE MEASUREMENTS (normal ranges within parentheses): Left Ventricle:          Normal IVSd (2D):      1.46 cm (0.7-1.1) LVPWd (2D):     1.39 cm (0.7-1.1) Aorta/LA:                  Normal LVIDd (2D):     4.19 cm (3.4-5.7) Aortic Root (2D): 3.40 cm (2.4-3.7) LVIDs (2D):     3.00 cm           Left Atrium (2D): 3.70 cm (1.9-4.0) LV FS (2D):     28.4 %   (>25%) LV EF (2D):     55.2 %   (>50%)                                   Right Ventricle:                                   RVd (2D):        7.40 cm LV DIASTOLIC FUNCTION: MV Peak  E: 0.72 m/s E/e' Ratio: 11.90 MV Peak A: 1.05 m/s Decel Time: 185 msec E/A Ratio: 0.68 SPECTRAL DOPPLER ANALYSIS (where applicable): Mitral Valve: MV P1/2 Time: 53.65 msec MV Area, PHT: 4.10 cm?? Aortic Valve: AoV Max Vel: 1.02 m/s AoV Peak PG: 4.1 mmHg AoV Mean PG: LVOT Vmax: 0.95 m/s LVOT VTI:  LVOT Diameter: 2.10 cm AoV Area, Vmax: 3.23 cm?? AoV Area, VTI:  AoV Area, Vmn: Tricuspid Valve and PA/RV Systolic Pressure: TR Max Velocity: 1.44 m/s RA   Pressure: 5 mmHg RVSP/PASP: 13.3 mmHg Pulmonic Valve: PV Max Velocity: 1.06 m/s PV Max PG: 4.5 mmHg PV Mean PG:  PHYSICIAN INTERPRETATION: Left Ventricle: The left ventricular internal cavity size was normal. LV  septal wall thickness was mildly increased. LV posterior wall thickness  was moderately increased. Global LV systolic function was normal. Left  ventricular ejection fraction, by visual estimation, is 55 to 60%. Right Ventricle: The right ventricular size is normal. Global RV systolic  function is normal. Left Atrium: The left atrium is normal in size. Right Atrium: The right atrium is normal in size. Pericardium: There is no evidence of pericardial effusion. Mitral Valve: The mitral valve is normal instructure. Trace mitral valve   regurgitation is seen. Tricuspid Valve: The tricuspid valve is normal. Trivial tricuspid  regurgitation is visualized. The tricuspid regurgitant velocity is 1.44  m/s, and with an assumed right atrial pressure of 5 mmHg, the estimated  right ventricular systolic pressure is normal at 13.3 mmHg. Aortic Valve: The aortic valve is normal. Pulmonic Valve: The pulmonic valve is normal.  [place] MD Electronically signed by Basile MD Signature Date/Time: 07/19/2013/2:07:49 PM  *** Final ***  IMPRESSION: .  Verified By: Yolonda Kida, M.D., MD  Routine Chem:  24-Mar-15 18:23   Glucose, Serum  109  BUN 13  Creatinine (comp) 0.93  Sodium, Serum 137  Potassium,  Serum 4.4  Chloride, Serum 102  CO2, Serum 32  Calcium (Total), Serum 8.8  Anion Gap  3  Osmolality (calc) 275  eGFR (African American) >60  eGFR (Non-African American) >60 (eGFR values <53mL/min/1.73 m2 may be an indication of chronic kidney disease (CKD). Calculated eGFR is useful in patients with stable renal function. The eGFR calculation will not be reliable in acutely ill patients when serum creatinine is changing rapidly. It is not useful in  patients on dialysis. The eGFR calculation may not be applicable to patients at the low and high extremes of body sizes, pregnant women, and vegetarians.)  B-Type Natriuretic Peptide [facility])  1676 (Result(s) reported on 18 Jul 2013 at 07:11PM.)  Cardiac:  24-Mar-15 18:23   CK, Total  25 (39-308 NOTE: NEW REFERENCE RANGE  05/29/2013)  CPK-MB, Serum 1.3 (Result(s) reported on 18 Jul 2013 at 07:11PM.)  Troponin I 0.05 (0.00-0.05 0.05 ng/mL or less: NEGATIVE  Repeat testing in 3-6 hrs  if clinically indicated. >0.05 ng/mL: POTENTIAL  MYOCARDIAL INJURY. Repeat  testing in 3-6 hrs if  clinically indicated. NOTE: An increase or decrease  of 30% or more on serial  testing suggests a  clinically important change)  Routine Hem:  24-Mar-15 18:23   WBC (CBC)  19.7  RBC (CBC) 5.58  Hemoglobin (CBC) 15.9  Hematocrit (CBC) 47.0  Platelet Count (CBC) 409 (Result(s) reported on 18 Jul 2013 at 06:46PM.)  MCV 84  MCH 28.5  MCHC 33.8  RDW 14.3   PERTINENT RADIOLOGY STUDIES: CT:    24-Mar-15 21:20, CT Chest Without Contrast  CT Chest Without Contrast   REASON FOR EXAM:    copd/ pneumonia.  COMMENTS:       PROCEDURE: CT  - CT CHEST WITHOUT CONTRAST  - Jul 18 2013  9:20PM     CLINICAL DATA:  Wheezing and stridor. Decreased breath sounds.  COPD/pneumonia.    EXAM:  CT CHEST WITHOUT CONTRAST    TECHNIQUE:  Multidetector CT imaging of the chest was performed following the  standard protocol without IV contrast.  COMPARISON:   03/21/2013    FINDINGS:  THORACIC INLET/BODY WALL:    No acute abnormality.    MEDIASTINUM:    Normal heart size. No pericardial effusion. No acute vascular  abnormality. Atherosclerosis, including the coronary arteries. No  adenopathy.    LUNG WINDOWS:  Pulmonary hyperinflation with apical centrilobular emphysematous  change. There is no evidence of lobar consolidation.    There is chronic obstruction of an anterior segment right upper lobe  bronchus with postobstructive airway opacification and bronchial  dilatation. 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 Bronchial obstruction has been noted dating back to  03/14/2011, although postobstructive change has increased from that  time. There is diffuse bronchial wall thickening which is likely  smoking-related as it is chronic and stable from prior. There are no  other enlarging or suspicious pulmonary nodules.    UPPER ABDOMEN:    No acute findings.  Splenectomy.  OSSEOUS:    No acute fracture.  No suspicious lytic or blastic lesions.     IMPRESSION:  1. 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 The mass could be  inflammatory or a slowly growing neoplasm. Consider follow-up  bronchoscopy.  2. COPD.      Electronically Signed    By: Jorje Guild M.D.    On: 07/18/2013 21:53     Verified By: Gilford Silvius, M.D.,   Pertinent Past History:  Pertinent Past History 1.  COPD. 2.  Hypertension.  3.  Diabetes.  4.  Macular degeneration.   Hospital Course:  Hospital Course 1. acute hypercarbic respiratory failure due to COPD exacerbation, continue o2 prn, keeping O2 sats 88-92, treat COPD exacerbnation- off bipap now. later with treatment came on room air. 2.COPD exacerbation, IV steroids, symbicort, tiotropium, nebs- much better. 3. RUL mass witrh pneumonia, rocephin,. zithromax,  pulmonary involved in recommendations about  mass- need Bronch as out pt due to being on ASA.     spoke to Pt and his daughter in detail- they want it to be here. I spoke to Dr. Raul Del on phone- his office will give a call to him on monday to schedule the procedure on Nex tuesday. 4. CHF ruled out , reviewed echo, contienu benazepril 5. HTN, well controled 6. DM, hgb a1c 6.5, diet, metformin, SSI- added levemir- as on steroid- causing Hyperglycemia. 7. A fib- NSR converted to A fib- rate 120-130, started metoprolol,  AppreciatedKC cardio consult. Dr. Clayborn Bigness suggested Anticoagulation- I spoke to him pn phone- he said that can wait till we can do work up of Lung mass- so nothing for now, discharge and after biopsy send him to Dr. Etta Quill office.   Condition on Discharge Stable   Code Status:  Code Status Full Code   DISCHARGE INSTRUCTIONS HOME MEDS:  Medication Reconciliation: Patient's Home Medications at Discharge:     Medication Instructions  atorvastatin 10 mg oral tablet  1 tab(s) orally once a day (at bedtime)   benazepril 40 mg oral tablet  1 tab(s) orally once a day   calcium-vitamin d 500 mg-200 intl units oral tablet  1 tab(s) orally once a day   flax seed oil - oral capsule  1000 milligram(s) orally once a day   guaifenesin 600 mg oral tablet, extended release  2 tab(s) (1200 mg) orally 2 times a day   acetaminophen-hydrocodone  1 tab(s) (5/500 mg) orally every 6 hours, As Needed - for Pain   metformin 500 mg oral tablet  1 tab(s) orally 2 times a day (with meals)   omega-3 polyunsaturated fatty acids 1000 mg oral capsule  1 cap(s) orally once a day   tiotropium 18 mcg inhalation capsule  1 cap(s) inhaled once a day   prednisone 10 mg oral tablet  Start at 60 mg and taper by 10 mg daily until complete   metoprolol tartrate 25 mg oral tablet  1 tab(s) orally 2 times a day   advair diskus 250 mcg-50 mcg inhalation powder  1 puff(s) inhaled 2 times a day   ceftin 500 mg oral tablet  1 tab(s) orally 2 times a day x 4  days   chlorpheniramine-hydrocodone 8 mg-10 mg/5 ml oral suspension, extended release  5 milliliter(s) orally every 12 hours, As Needed for cough    STOP TAKING THE FOLLOWING MEDICATION(S):    aspirin enteric coated 81 mg oral delayed release tablet: 1 tab(s) orally once a day clobetasol topical 0.05% topical ointment: Apply topically to eczema 2-3 times a day  Physician's Instructions:  Diet Low Sodium  Low Fat, Low Cholesterol  Carbohydrate Controlled (ADA) Diet   Activity Limitations As tolerated   Return to Work Not Applicable   Time frame for Follow Up Appointment 1-2 days  Dr. Raul Del.   Other Comments Pt need to call Dr. Gust Brooms office ( 07/24/13) to schedule for bronch as out pt. Pt Call Dr. Clayborn Bigness office to make appointment 2-3 days after Bronch procedure to start on anticoagulation for A fib. Explained to daughter, she understands.   Electronic  Signatures: [name] (MD)  (Signed 31-Mar-15 15:28)  Authored: ADMISSION DATE AND DIAGNOSIS, CHIEF COMPLAINT/HPI, Allergies, PERTINENT LABS, PERTINENT RADIOLOGY STUDIES, PERTINENT PAST HISTORY, HOSPITAL COURSE, DISCHARGE INSTRUCTIONS HOME MEDS, PATIENT INSTRUCTIONS   Last Updated: 31-Mar-15 15:28 by [name] (MD)

## 2014-08-18 NOTE — H&P (Signed)
PATIENT NAME:  [name], [name] MR#:  [id number] DATE OF BIRTH:  [birth year]  DATE OF ADMISSION:  07/18/2013  PRIMARY CARE PHYSICIAN: Unk Pinto, MD, at [facility] REFERRING EMERGENCY ROOM PHYSICIAN: Lenise Arena, MD  CHIEF COMPLAINT: Shortness of breath and lethargic.   HISTORY OF PRESENTING ILLNESS: This is a 74 year old male with past medical history of COPD, macular degeneration, diabetes, hypertension, who lives alone and is managing his COPD with nebulizers but no home oxygen. Was last seen fine on Saturday last week. That was 3 days ago. Most likely from Sunday, he started having some shortness of breath and somewhat cough. Today, his daughter's boyfriend called him and [place]. The patient said he is not feeling well and so he went to his home and found him coughing and somewhat short of breath and did not look good, so he spoke to his daughter, who works in [employer] as a Charity fundraiser in [place] and brought the patient in car to [employer] to [employer] in walk-in clinic.   The patient was seen over there and was extremely short of breath and was lethargic, so sent to the Emergency Room directly from there.   In the ER, he was found lethargic. His venous blood sample showed CO2 retention, and he was short of breath, so started on BiPAP for COPD management. Some treatment was given initially and then given hospitalist service for further management.   The patient is still somewhat lethargic and so history is most obtained from his daughter, who is present in the room, who is also healthcare power of attorney. On further questioning, she denies any sputum production, but he had fever and some dry cough. He used inhalers at home but did not help much.   REVIEW OF SYSTEMS: Not able to get it as the patient is lethargic.   PAST MEDICAL HISTORY:  1.  COPD. 2.  Hypertension.  3.  Diabetes.  4.  Macular degeneration.   PAST SURGICAL HISTORY: None.    SOCIAL HISTORY: Lives alone at [place]. He is chewing tobacco every day. He was a chronic smoker for many years and currently cut down, but he still smokes every now and then when he has some company. Denies alcohol or illegal drug use.   FAMILY HISTORY: Both his parents had MI. His father died in 44s and his mother died in 88s with cardiac issues. His sister also has cardiac issues currently.   HOME MEDICATION:  1.  Acetaminophen and hydrocodone 1 tablet every 6 hours as needed for pain.  2.  Aspirin enteric-coated 81 mg once a day.  3.  Atorvastatin 10 mg once.  4.  Benazepril 40 mg oral tablet once a day.  5.  Calcium plus vitamin D 500 plus 200 mg tablet once a day.  6.  Warfarin 600 mg oral tablet extended-release 2 tablets two times a day.  7.  Metformin 500 mg oral tablet 2 times a day.  8.  Omega-3 polyunsaturated fatty acid 1000 mg oral capsule once a day.  9.  Spiriva 18 mcg inhalation capsule once a day.   PHYSICAL EXAMINATION:  VITAL SIGNS: In ER, temperature 99. Pulse rate is 110. Respiratory rate is 22. Blood pressure is 144/83, and pulse oximetry is 94% on room air.  GENERAL: Currently somewhat drowsy. He is using BiPAP, easily arousable and he speaks some but then again goes to sleep. He said that he is tired.  HEENT: Head and neck atraumatic. Conjunctivae pink.  Oral mucosa dry.  NECK: Supple. Using BiPAP currently.  RESPIRATORY: Bilateral air entry present. Somewhat crackles and wheezing present.  CARDIOVASCULAR: S1, S2 present, regular, and no murmur.  ABDOMEN: Soft, nontender. Bowel sounds present. No organomegaly.  SKIN: No rashes. Legs: No edema.  NEUROLOGICAL: Power 4/5. Moves all 4 limbs. Generalized weakness but follows commands when awake and no tremors or shaking.   IMPORTANT LABORATORY RESULTS: BNP is 1676. Glucose is 109. BUN is 13, creatinine 0.93, sodium 137, potassium 4.4, chloride 102, CO2 of 32. Calcium is 8.8. Total protein 8.1. Albumin is 4.0,  bilirubin 0.6, alk phos 75, SGOT 20, and SGPT 26. Troponin 0.05. WBC is 19.7, hemoglobin 15.9. Platelet count is 409, MCV is 84.   On venous sample, pH is 7.26, pCO2 is 72.   Chest x-ray, portable, single view, shows mild peribronchial thickening and slightly increased opacity in right lung base. Infection not excluded. Right upper lobe nodular opacity, not well seen, possibly due to patient's.  ASSESSMENT AND PLAN: This is a 74 year old male with past medical history of chronic obstructive pulmonary disease, hypertension, and diabetes, who is brought to the hospital from [facility] walk-in clinic because of shortness of breath and lethargy and is on BiPAP for acute respiratory failure.  1.  Acute respiratory failure and sepsis secondary to bronchitis, possibly pneumonia, and chronic obstructive pulmonary disease exacerbation. Sepsis as evident by tachycardia, elevated white cell count, and altered mental status on presentation. The patient is still on BiPAP for respiratory failure. We will continue using it for now and put him in stepdown unit. We will treat his bronchitis versus pneumonia with Rocephin and azithromycin currently, and we will get CT scan of the chest for further evaluation of these findings as there is some questionable nodule in the right upper lobe.  2.  Chronic obstructive pulmonary disease exacerbation. We will give IV Solu-Medrol, Spiriva, DuoNebs, and antibiotics for further treatment of this.  3.  Hypertension. We will continue benazepril as he was taking at home.  4.  Diabetes. We will continue metformin and insulin sliding-scale coverage as he was taking at home.  5.  Deep vein thrombosis prophylaxis with heparin subcutaneous.   CODE STATUS: FULL CODE.  THE PATIENT'S HEALTHCARE POWER OF ATTORNEY HIS DAUGHTER, WHO IS PRESENT IN THE ROOM.   TOTAL CRITICAL CARE TIME SPENT ON THIS ADMISSION: 50 minutes.   Will call pulmonary consult with Dr. Raul Del for further management  of his respiratory issue.   ____________________________ Ceasar Lund. Anselm Jungling, MD vgv:np D: 07/18/2013 20:34:32 ET T: 07/18/2013 21:05:40 ET JOB#: [id number]  cc: Ceasar Lund. Anselm Jungling, MD, <Dictator> Vaughan Basta MD ELECTRONICALLY SIGNED 07/25/2013 18:29

## 2014-08-29 ENCOUNTER — Other Ambulatory Visit: Payer: Self-pay | Admitting: Physician Assistant

## 2014-09-02 ENCOUNTER — Other Ambulatory Visit: Payer: Self-pay | Admitting: Internal Medicine

## 2014-09-03 ENCOUNTER — Other Ambulatory Visit: Payer: Self-pay

## 2014-09-03 MED ORDER — TRAMADOL HCL 50 MG PO TABS: 50.0000 mg | ORAL_TABLET | Freq: Four times a day (QID) | ORAL | Status: DC | PRN

## 2014-10-16 ENCOUNTER — Encounter: Payer: Self-pay | Admitting: Physician Assistant

## 2014-11-02 ENCOUNTER — Encounter: Payer: Self-pay | Admitting: Internal Medicine

## 2014-11-07 ENCOUNTER — Encounter: Payer: Self-pay | Admitting: Physician Assistant

## 2014-11-07 DIAGNOSIS — Z7189 Other specified counseling: Secondary | ICD-10-CM | POA: Insufficient documentation

## 2014-11-13 ENCOUNTER — Ambulatory Visit (INDEPENDENT_AMBULATORY_CARE_PROVIDER_SITE_OTHER): Payer: Medicare Other | Admitting: Physician Assistant

## 2014-11-13 ENCOUNTER — Encounter: Payer: Self-pay | Admitting: Physician Assistant

## 2014-11-13 VITALS — BP 108/72 | HR 80 | Temp 97.0°F | Resp 18 | Ht 70.0 in | Wt 198.0 lb

## 2014-11-13 DIAGNOSIS — C439 Malignant melanoma of skin, unspecified: Secondary | ICD-10-CM

## 2014-11-13 DIAGNOSIS — Z0000 Encounter for general adult medical examination without abnormal findings: Secondary | ICD-10-CM

## 2014-11-13 DIAGNOSIS — D649 Anemia, unspecified: Secondary | ICD-10-CM

## 2014-11-13 DIAGNOSIS — I10 Essential (primary) hypertension: Secondary | ICD-10-CM

## 2014-11-13 DIAGNOSIS — Z23 Encounter for immunization: Secondary | ICD-10-CM

## 2014-11-13 DIAGNOSIS — Z79899 Other long term (current) drug therapy: Secondary | ICD-10-CM

## 2014-11-13 DIAGNOSIS — Z9081 Acquired absence of spleen: Secondary | ICD-10-CM

## 2014-11-13 DIAGNOSIS — E559 Vitamin D deficiency, unspecified: Secondary | ICD-10-CM

## 2014-11-13 DIAGNOSIS — Z6828 Body mass index (BMI) 28.0-28.9, adult: Secondary | ICD-10-CM

## 2014-11-13 DIAGNOSIS — I4891 Unspecified atrial fibrillation: Secondary | ICD-10-CM | POA: Insufficient documentation

## 2014-11-13 DIAGNOSIS — E669 Obesity, unspecified: Secondary | ICD-10-CM

## 2014-11-13 DIAGNOSIS — J449 Chronic obstructive pulmonary disease, unspecified: Secondary | ICD-10-CM

## 2014-11-13 DIAGNOSIS — E782 Mixed hyperlipidemia: Secondary | ICD-10-CM

## 2014-11-13 DIAGNOSIS — E1122 Type 2 diabetes mellitus with diabetic chronic kidney disease: Secondary | ICD-10-CM

## 2014-11-13 DIAGNOSIS — H9193 Unspecified hearing loss, bilateral: Secondary | ICD-10-CM

## 2014-11-13 DIAGNOSIS — N320 Bladder-neck obstruction: Secondary | ICD-10-CM

## 2014-11-13 DIAGNOSIS — H548 Legal blindness, as defined in USA: Secondary | ICD-10-CM

## 2014-11-13 DIAGNOSIS — R911 Solitary pulmonary nodule: Secondary | ICD-10-CM

## 2014-11-13 DIAGNOSIS — Z9181 History of falling: Secondary | ICD-10-CM | POA: Insufficient documentation

## 2014-11-13 DIAGNOSIS — E09311 Drug or chemical induced diabetes mellitus with unspecified diabetic retinopathy with macular edema: Secondary | ICD-10-CM

## 2014-11-13 DIAGNOSIS — H9190 Unspecified hearing loss, unspecified ear: Secondary | ICD-10-CM | POA: Insufficient documentation

## 2014-11-13 DIAGNOSIS — I480 Paroxysmal atrial fibrillation: Secondary | ICD-10-CM

## 2014-11-13 LAB — HEMOGLOBIN A1C
HEMOGLOBIN A1C: 6.6 % — AB (ref <5.7)
MEAN PLASMA GLUCOSE: 143 mg/dL — AB (ref <117)

## 2014-11-13 MED ORDER — TRAMADOL HCL 50 MG PO TABS: 50.0000 mg | ORAL_TABLET | Freq: Four times a day (QID) | ORAL | Status: DC | PRN

## 2014-11-13 NOTE — Progress Notes (Signed)
Complete Physical  Assessment and Plan: 1. Essential hypertension Cut the benazepril in half - CBC with Differential/Platelet - BASIC METABOLIC PANEL WITH GFR - Hepatic function panel - TSH - EKG 12-Lead - [place], RETROPERITNL ABD,  LTD  2. Type 2 diabetes mellitus with diabetic chronic kidney disease Discussed general issues about diabetes pathophysiology and management., Educational material distributed., Suggested low cholesterol diet., Encouraged aerobic exercise., Discussed foot care., Reminded to get yearly retinal exam. - See eye doctor - Hemoglobin A1c - Insulin, fasting - Urinalysis, Routine w reflex microscopic (not at [facility]) - Microalbumin / creatinine urine ratio - HM DIABETES FOOT EXAM  3. Hyperlipidemia -continue medications, check lipids, decrease fatty foods, increase activity.  - Lipid panel  4. Obesity Obesity with co morbidities- long discussion about weight loss, diet, and exercise  5. Vitamin D deficiency - Vit D  25 hydroxy (rtn osteoporosis monitoring)  6. Medication management - Magnesium  7. Pulmonary nodule, right upper lobe, 1cm, new since 2012 Declines further work up  8. History of splenectomy-for hereditary spherocytosis Suggest vaccines, check labs.   9. BPH/prostatism Doing well  10. Melanoma of skin Needs skin exam  11. Chronic obstructive pulmonary disease, unspecified COPD, unspecified chronic bronchitis type  continue meds.   12. Legal blindness -macular degeneration with retinopathy Follow up eye doctor  13. Hearing loss, bilateral Continue hearing aids  14. Anemia, unspecified anemia type - Iron and TIBC - Ferritin - Vitamin B12  15. Afib/flutter rate controlled Very high fall risk, recent fall May 2nd with rib fractures, will not do anticoagulation at this time, will do 2 bASA, and will continue meds the same, recheck next OV.   Future Appointments Date Time Provider [place]  11/14/2015 10:00 AM Vicie Mutters, PA-C [id number] None    Discussed med's effects and SE's. Screening labs and tests as requested with regular follow-up as recommended. Over 40 minutes of exam, counseling, chart review and critical decision making was performed  HPI Patient presents for a complete physical.   His blood pressure has been controlled at home, today their BP is BP: 108/72 mmHg He does not workout. He denies chest pain. He does have dyspnea with exertion and he does have some dizziness with standing. Will have left shoulder.  He is on cholesterol medication, lipitor 40 and denies myalgias. His cholesterol is at goal. The cholesterol last visit was:   Lab Results  Component Value Date   CHOL 139 08/06/2014   HDL 22* 08/06/2014   LDLCALC 60 08/06/2014   TRIG 287* 08/06/2014   CHOLHDL 6.3 08/06/2014   He has been working on diet and exercise for diabetes, he is on bASA, he is on ACE/ARB and denies hypoglycemia , paresthesia of the feet, polydipsia and polyuria. Last A1C in the office was:  Lab Results  Component Value Date   HGBA1C 7.6* 08/06/2014   Patient is on Vitamin D supplement.   Lab Results  Component Value Date   VD25OH 37 08/06/2014     He has COPD and had recent CT  Chest in 2014 that showed lung nodule however he has been seeing Dr. Raul Del at [facility] and had a negative bronchoscopy for cancer, he continues to be monitored there. Had chest Xray, pending results on the 14th.   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  Was on magnesium supplement but it was increased to 2 a day but that  gave im diarrhea, will go back to 1 a day.   Current Medications:  Current Outpatient Prescriptions on File Prior to Visit  Medication Sig Dispense Refill  . aspirin EC 81 MG tablet Take 81 mg by mouth daily.      Marland Kitchen atorvastatin (LIPITOR) 40 MG tablet TAKE 1 TABLET EVERY DAY FOR CHOLESTEROL 90 tablet 1  . benazepril (LOTENSIN)  40 MG tablet TAKE 1 TABLET BY MOUTH EVERY DAY 90 tablet 2  . calcium-vitamin D (OSCAL WITH D) 500-200 MG-UNIT per tablet Take 1 tablet by mouth daily.      . clobetasol ointment (TEMOVATE) 0.05 % Apply to eczema rash 2-3 x/da 30 g 99  . Flaxseed, Linseed, (FLAXSEED OIL) 1000 MG CAPS Take 1,000 mg by mouth daily.    . Fluticasone Furoate-Vilanterol 100-25 MCG/INH AEPB Do 1-2 inhalations once daily 60 each 2  . glucose blood test strip Test blood sugar 3 times daily or as directed for fluctuating blood sugars. DX 250.00 100 each 6  . guaiFENesin (MUCINEX) 600 MG 12 hr tablet Take 1,200 mg by mouth 2 (two) times daily.      Marland Kitchen HYDROcodone-acetaminophen (NORCO) 5-325 MG per tablet Take 1 tablet by mouth every 6 (six) hours as needed for moderate pain (for cough). 60 tablet 0  . metFORMIN (GLUCOPHAGE) 500 MG tablet Take 1 - 2 tablets twice daily with food 360 tablet 1  . metoprolol tartrate (LOPRESSOR) 25 MG tablet Take 1 tablet (25 mg total) by mouth 2 (two) times daily. 180 tablet 1  . metoprolol tartrate (LOPRESSOR) 25 MG tablet TAKE 1 TABLET BY MOUTH TWICE A DAY 60 tablet 2  . Omega-3 Fatty Acids (FISH OIL) 1000 MG CAPS Take by mouth daily.    Marland Kitchen tiotropium (SPIRIVA) 18 MCG inhalation capsule Place 18 mcg into inhaler and inhale daily.     . traMADol (ULTRAM) 50 MG tablet Take 1 tablet (50 mg total) by mouth 4 (four) times daily as needed (Pain). 30 tablet 0   No current facility-administered medications on file prior to visit.   Health Maintenance:  Immunization History  Administered Date(s) Administered  . Pneumococcal-Unspecified 04/27/2001  . Td 04/27/2002   Preventative care: Last colonoscopy: 2010  Prior vaccinations: TD or Tdap: 2004DUE Influenza: declines Pneumococcal: 2003- states got at [place] [facility]: DUE Shingles/Zostavax: declines  Eye Exam: Dr. Delman Cheadle 10 years ago Dentist: NONE  Patient Care Team: Unk Pinto, MD as PCP - General (Internal  Medicine) Minus Breeding, MD as Consulting Physician (Cardiology) Gatha Mayer, MD as Consulting Physician (Gastroenterology) Carolan Clines, MD as Consulting Physician (Urology) Crista Luria, MD as Consulting Physician (Dermatology) Brand Males, MD as Consulting Physician (Pulmonary Disease)  Allergies:  Allergies  Allergen Reactions  . Crestor [Rosuvastatin]   . Latex Itching  . Neomycin-Bacitracin Zn-Polymyx Other (See Comments)    Causes blisters  . Ciprofloxacin Rash   Medical History:  Past Medical History  Diagnosis Date  . COPD (chronic obstructive pulmonary disease)   . Emphysema   . Hypertension   . Diabetes mellitus   . Hypercholesteremia    Surgical History:  Past Surgical History  Procedure Laterality Date  . Bladder repair    . Cholecystectomy    . Splenectomy, total     Family History:  Family History  Problem Relation Age of Onset  . Heart disease Mother   . Heart disease Father    Social History:   History  Substance Use Topics  . Smoking status: Former  Smoker -- 3.00 packs/day for 45 years    Types: Cigarettes    Quit date: 04/27/1994  . Smokeless tobacco: Not on file  . Alcohol Use: No   Review of Systems:  Review of Systems  Constitutional: Negative.   HENT: Positive for hearing loss (wears hearing aids).   Eyes: Positive for blurred vision (Mac Deg). Negative for double vision, photophobia, pain, discharge and redness.  Respiratory: Positive for shortness of breath. Negative for cough, hemoptysis, sputum production and wheezing.   Cardiovascular: Negative.   Gastrointestinal: Positive for diarrhea (with magnesium). Negative for heartburn, nausea, vomiting, abdominal pain, constipation, blood in stool and melena.  Genitourinary: Negative.   Musculoskeletal: Negative.   Skin: Negative.   Neurological: Positive for dizziness. Negative for tingling, tremors, sensory change, speech change, focal weakness, seizures and loss of  consciousness.  Psychiatric/Behavioral: Negative.     Physical Exam: Estimated body mass index is 28.41 kg/(m^2) as calculated from the following:   Height as of this encounter: 5\' 10"  (1.778 m).   Weight as of this encounter: 198 lb (89.812 kg). BP 108/72 mmHg  Pulse 80  Temp(Src) 97 F (36.1 C)  Resp 18  Ht 5\' 10"  (1.778 m)  Wt 198 lb (89.812 kg)  BMI 28.41 kg/m2 General Appearance: Well nourished, in no apparent distress.  Eyes: PERRLA, EOMs, conjunctiva no swelling or erythema, normal fundi and vessels.  Sinuses: No Frontal/maxillary tenderness  ENT/Mouth: Ext aud canals clear, normal light reflex with TMs without erythema, bulging. Good dentition. No erythema, swelling, or exudate on post pharynx. Tonsils not swollen or erythematous. Hearing decreased.  Neck: Supple, thyroid normal. No bruits  Respiratory: BS decreased bilaterally with decreased breath sounds right lung with rhonchi with out rales, wheezing or stridor.  Cardio: Irreg irreg with 2/6 systolic murmur, rubs or gallops. Decreased bilateral pulses.  Chest: symmetric, with normal excursions and percussion.  Abdomen: Soft, nontender, no guarding, rebound, hernias, masses, or organomegaly.  Lymphatics: Non tender without lymphadenopathy.  Genitourinary: defer Musculoskeletal: Full ROM all peripheral extremities,4/5 strength, and antalgic gait.  Skin: Warm, dry without rashes, lesions, ecchymosis. Neuro: Cranial nerves intact, reflexes equal bilaterally. Normal muscle tone, no cerebellar symptoms.  Psych: Awake and oriented X 3, normal affect, Insight and Judgment appropriate.   EKG: New Afib/flutter, no ST changes, rate controlled AORTA SCAN: WNL  Vicie Mutters 10:20 AM [facility]

## 2014-11-13 NOTE — Patient Instructions (Signed)
Please monitor your blood pressure, as we get older our body can not respond to a low blood pressure as well as it did when we were younger, for this reason we want a bit higher of a blood pressure as you get older to avoid dizziness and fatigue which can lead to falls. [place]ase call if your blood pressure is consistently above 150/90.   Cut your benazepril in half.

## 2014-11-14 LAB — URINALYSIS, ROUTINE W REFLEX MICROSCOPIC
Bilirubin Urine: NEGATIVE
GLUCOSE, UA: NEGATIVE mg/dL
HGB URINE DIPSTICK: NEGATIVE
Ketones, ur: NEGATIVE mg/dL
LEUKOCYTES UA: NEGATIVE
NITRITE: NEGATIVE
PH: 6 (ref 5.0–8.0)
Protein, ur: 30 mg/dL — AB
Specific Gravity, Urine: 1.018 (ref 1.005–1.030)
UROBILINOGEN UA: 0.2 mg/dL (ref 0.0–1.0)

## 2014-11-14 LAB — CBC WITH DIFFERENTIAL/PLATELET
Basophils Absolute: 0.1 10*3/uL (ref 0.0–0.1)
Basophils Relative: 1 % (ref 0–1)
EOS ABS: 0.6 10*3/uL (ref 0.0–0.7)
EOS PCT: 5 % (ref 0–5)
HCT: 45.4 % (ref 39.0–52.0)
Hemoglobin: 15.8 g/dL (ref 13.0–17.0)
LYMPHS ABS: 2.4 10*3/uL (ref 0.7–4.0)
Lymphocytes Relative: 19 % (ref 12–46)
MCH: 30.2 pg (ref 26.0–34.0)
MCHC: 34.8 g/dL (ref 30.0–36.0)
MCV: 86.6 fL (ref 78.0–100.0)
MONOS PCT: 14 % — AB (ref 3–12)
MPV: 10.5 fL (ref 8.6–12.4)
Monocytes Absolute: 1.8 10*3/uL — ABNORMAL HIGH (ref 0.1–1.0)
Neutro Abs: 7.7 10*3/uL (ref 1.7–7.7)
Neutrophils Relative %: 61 % (ref 43–77)
Platelets: 346 10*3/uL (ref 150–400)
RBC: 5.24 MIL/uL (ref 4.22–5.81)
RDW: 14.2 % (ref 11.5–15.5)
WBC: 12.6 10*3/uL — ABNORMAL HIGH (ref 4.0–10.5)

## 2014-11-14 LAB — LIPID PANEL
CHOL/HDL RATIO: 4.9 ratio
CHOLESTEROL: 98 mg/dL (ref 0–200)
HDL: 20 mg/dL — ABNORMAL LOW (ref >=40)
LDL Cholesterol: 49 mg/dL (ref 0–99)
TRIGLYCERIDES: 143 mg/dL (ref <150)
VLDL: 29 mg/dL (ref 0–40)

## 2014-11-14 LAB — IRON AND TIBC
%SAT: 23 % (ref 20–55)
Iron: 70 ug/dL (ref 42–165)
TIBC: 310 ug/dL (ref 215–435)
UIBC: 240 ug/dL (ref 125–400)

## 2014-11-14 LAB — INSULIN, FASTING: Insulin fasting, serum: 3.7 u[IU]/mL (ref 2.0–19.6)

## 2014-11-14 LAB — URINALYSIS, MICROSCOPIC ONLY
Bacteria, UA: NONE SEEN
CRYSTALS: NONE SEEN
Casts: NONE SEEN
Squamous Epithelial / LPF: NONE SEEN

## 2014-11-14 LAB — BASIC METABOLIC PANEL WITH GFR
BUN: 18 mg/dL (ref 6–23)
CHLORIDE: 101 meq/L (ref 96–112)
CO2: 30 meq/L (ref 19–32)
CREATININE: 0.93 mg/dL (ref 0.50–1.35)
Calcium: 10.4 mg/dL (ref 8.4–10.5)
GFR, Est African American: >89 mL/min
GFR, Est Non African American: 79 mL/min
Glucose, Bld: 104 mg/dL — ABNORMAL HIGH (ref 70–99)
Potassium: 5 mEq/L (ref 3.5–5.3)
Sodium: 140 mEq/L (ref 135–145)

## 2014-11-14 LAB — HEPATIC FUNCTION PANEL
ALBUMIN: 4 g/dL (ref 3.5–5.2)
ALT: 18 U/L (ref 0–53)
AST: 15 U/L (ref 0–37)
Alkaline Phosphatase: 52 U/L (ref 39–117)
BILIRUBIN DIRECT: 0.2 mg/dL (ref 0.0–0.3)
BILIRUBIN INDIRECT: 0.5 mg/dL (ref 0.2–1.2)
Total Bilirubin: 0.7 mg/dL (ref 0.2–1.2)
Total Protein: 6.9 g/dL (ref 6.0–8.3)

## 2014-11-14 LAB — MAGNESIUM: Magnesium: 1.6 mg/dL (ref 1.5–2.5)

## 2014-11-14 LAB — MICROALBUMIN / CREATININE URINE RATIO
Creatinine, Urine: 114.8 mg/dL
MICROALB/CREAT RATIO: 112.4 mg/g — AB (ref 0.0–30.0)
Microalb, Ur: 12.9 mg/dL — ABNORMAL HIGH (ref <2.0)

## 2014-11-14 LAB — VITAMIN B12: Vitamin B-12: 407 pg/mL (ref 211–911)

## 2014-11-14 LAB — TSH: TSH: 1.401 u[IU]/mL (ref 0.350–4.500)

## 2014-11-14 LAB — VITAMIN D 25 HYDROXY (VIT D DEFICIENCY, FRACTURES): VIT D 25 HYDROXY: 73 ng/mL (ref 30–100)

## 2014-11-14 LAB — FERRITIN: Ferritin: 858 ng/mL — ABNORMAL HIGH (ref 22–322)

## 2014-12-09 ENCOUNTER — Other Ambulatory Visit: Payer: Self-pay | Admitting: Internal Medicine

## 2015-01-29 ENCOUNTER — Other Ambulatory Visit: Payer: Self-pay | Admitting: Physician Assistant

## 2015-01-29 NOTE — Telephone Encounter (Signed)
Rx called into [facility] @ 1:35pm.

## 2015-02-26 ENCOUNTER — Encounter: Payer: Self-pay | Admitting: Physician Assistant

## 2015-02-26 ENCOUNTER — Ambulatory Visit (INDEPENDENT_AMBULATORY_CARE_PROVIDER_SITE_OTHER): Payer: Medicare Other | Admitting: Physician Assistant

## 2015-02-26 VITALS — BP 130/60 | HR 67 | Temp 97.5°F | Resp 14 | Ht 70.0 in | Wt 190.0 lb

## 2015-02-26 DIAGNOSIS — Z0000 Encounter for general adult medical examination without abnormal findings: Secondary | ICD-10-CM

## 2015-02-26 DIAGNOSIS — R6889 Other general symptoms and signs: Secondary | ICD-10-CM

## 2015-02-26 DIAGNOSIS — E669 Obesity, unspecified: Secondary | ICD-10-CM

## 2015-02-26 DIAGNOSIS — Z79899 Other long term (current) drug therapy: Secondary | ICD-10-CM

## 2015-02-26 DIAGNOSIS — E09311 Drug or chemical induced diabetes mellitus with unspecified diabetic retinopathy with macular edema: Secondary | ICD-10-CM | POA: Diagnosis not present

## 2015-02-26 DIAGNOSIS — R911 Solitary pulmonary nodule: Secondary | ICD-10-CM | POA: Diagnosis not present

## 2015-02-26 DIAGNOSIS — H548 Legal blindness, as defined in USA: Secondary | ICD-10-CM

## 2015-02-26 DIAGNOSIS — N182 Chronic kidney disease, stage 2 (mild): Secondary | ICD-10-CM

## 2015-02-26 DIAGNOSIS — N320 Bladder-neck obstruction: Secondary | ICD-10-CM

## 2015-02-26 DIAGNOSIS — C439 Malignant melanoma of skin, unspecified: Secondary | ICD-10-CM

## 2015-02-26 DIAGNOSIS — E1122 Type 2 diabetes mellitus with diabetic chronic kidney disease: Secondary | ICD-10-CM | POA: Diagnosis not present

## 2015-02-26 DIAGNOSIS — I480 Paroxysmal atrial fibrillation: Secondary | ICD-10-CM | POA: Diagnosis not present

## 2015-02-26 DIAGNOSIS — H9193 Unspecified hearing loss, bilateral: Secondary | ICD-10-CM

## 2015-02-26 DIAGNOSIS — I10 Essential (primary) hypertension: Secondary | ICD-10-CM

## 2015-02-26 DIAGNOSIS — E782 Mixed hyperlipidemia: Secondary | ICD-10-CM | POA: Diagnosis not present

## 2015-02-26 DIAGNOSIS — Z0001 Encounter for general adult medical examination with abnormal findings: Secondary | ICD-10-CM | POA: Diagnosis not present

## 2015-02-26 DIAGNOSIS — R296 Repeated falls: Secondary | ICD-10-CM

## 2015-02-26 DIAGNOSIS — J449 Chronic obstructive pulmonary disease, unspecified: Secondary | ICD-10-CM | POA: Diagnosis not present

## 2015-02-26 DIAGNOSIS — Z9181 History of falling: Secondary | ICD-10-CM

## 2015-02-26 DIAGNOSIS — E559 Vitamin D deficiency, unspecified: Secondary | ICD-10-CM

## 2015-02-26 DIAGNOSIS — Z9081 Acquired absence of spleen: Secondary | ICD-10-CM | POA: Diagnosis not present

## 2015-02-26 LAB — BASIC METABOLIC PANEL WITH GFR
BUN: 18 mg/dL (ref 7–25)
CO2: 24 mmol/L (ref 20–31)
Calcium: 9.6 mg/dL (ref 8.6–10.3)
Chloride: 103 mmol/L (ref 98–110)
Creat: 0.81 mg/dL (ref 0.70–1.18)
GFR, Est Non African American: 86 mL/min (ref >=60)
GLUCOSE: 177 mg/dL — AB (ref 65–99)
Potassium: 4.4 mmol/L (ref 3.5–5.3)
Sodium: 139 mmol/L (ref 135–146)

## 2015-02-26 LAB — CBC WITH DIFFERENTIAL/PLATELET
Basophils Absolute: 0.1 10*3/uL (ref 0.0–0.1)
Basophils Relative: 1 % (ref 0–1)
EOS PCT: 6 % — AB (ref 0–5)
Eosinophils Absolute: 0.8 10*3/uL — ABNORMAL HIGH (ref 0.0–0.7)
HCT: 44.6 % (ref 39.0–52.0)
Hemoglobin: 16 g/dL (ref 13.0–17.0)
LYMPHS ABS: 2 10*3/uL (ref 0.7–4.0)
Lymphocytes Relative: 15 % (ref 12–46)
MCH: 30 pg (ref 26.0–34.0)
MCHC: 35.9 g/dL (ref 30.0–36.0)
MCV: 83.7 fL (ref 78.0–100.0)
MONO ABS: 1.7 10*3/uL — AB (ref 0.1–1.0)
MONOS PCT: 13 % — AB (ref 3–12)
MPV: 10.5 fL (ref 8.6–12.4)
Neutro Abs: 8.6 10*3/uL — ABNORMAL HIGH (ref 1.7–7.7)
Neutrophils Relative %: 65 % (ref 43–77)
Platelets: 321 10*3/uL (ref 150–400)
RBC: 5.33 MIL/uL (ref 4.22–5.81)
RDW: 13.7 % (ref 11.5–15.5)
WBC: 13.3 10*3/uL — ABNORMAL HIGH (ref 4.0–10.5)

## 2015-02-26 LAB — HEPATIC FUNCTION PANEL
ALBUMIN: 4.2 g/dL (ref 3.6–5.1)
ALK PHOS: 65 U/L (ref 40–115)
ALT: 16 U/L (ref 9–46)
AST: 13 U/L (ref 10–35)
Bilirubin, Direct: 0.2 mg/dL (ref <=0.2)
Indirect Bilirubin: 0.6 mg/dL (ref 0.2–1.2)
TOTAL PROTEIN: 6.6 g/dL (ref 6.1–8.1)
Total Bilirubin: 0.8 mg/dL (ref 0.2–1.2)

## 2015-02-26 LAB — LIPID PANEL
Cholesterol: 107 mg/dL — ABNORMAL LOW (ref 125–200)
HDL: 22 mg/dL — ABNORMAL LOW (ref >=40)
LDL CALC: 54 mg/dL (ref <130)
Total CHOL/HDL Ratio: 4.9 Ratio (ref <=5.0)
Triglycerides: 153 mg/dL — ABNORMAL HIGH (ref <150)
VLDL: 31 mg/dL — ABNORMAL HIGH (ref <30)

## 2015-02-26 LAB — HEMOGLOBIN A1C
HEMOGLOBIN A1C: 6.4 % — AB (ref <5.7)
MEAN PLASMA GLUCOSE: 137 mg/dL — AB (ref <117)

## 2015-02-26 LAB — TSH: TSH: 1.203 u[IU]/mL (ref 0.350–4.500)

## 2015-02-26 LAB — MAGNESIUM: MAGNESIUM: 1.4 mg/dL — AB (ref 1.5–2.5)

## 2015-02-26 MED ORDER — METFORMIN HCL 500 MG PO TABS: ORAL_TABLET | ORAL | Status: DC

## 2015-02-26 MED ORDER — CLOBETASOL PROPIONATE 0.05 % EX OINT: TOPICAL_OINTMENT | CUTANEOUS | Status: DC

## 2015-02-26 NOTE — Patient Instructions (Signed)
Preventive Care for Adults A healthy lifestyle and preventive care can promote health and wellness. Preventive health guidelines for men include the following key practices:  A routine yearly physical is a good way to check with your health care provider about your health and preventative screening. It is a chance to share any concerns and updates on your health and to receive a thorough exam.  Visit your dentist for a routine exam and preventative care every 6 months. Brush your teeth twice a day and floss once a day. Good oral hygiene prevents tooth decay and gum disease.  The frequency of eye exams is based on your age, health, family medical history, use of contact lenses, and other factors. Follow your health care provider's recommendations for frequency of eye exams.  Eat a healthy diet. Foods such as vegetables, fruits, whole grains, low-fat dairy products, and lean protein foods contain the nutrients you need without too many calories. Decrease your intake of foods high in solid fats, added sugars, and salt. Eat the right amount of calories for you.Get information about a proper diet from your health care provider, if necessary.  Regular physical exercise is one of the most important things you can do for your health. Most adults should get at least 150 minutes of moderate-intensity exercise (any activity that increases your heart rate and causes you to sweat) each week. In addition, most adults need muscle-strengthening exercises on 2 or more days a week.  Maintain a healthy weight. The body mass index (BMI) is a screening tool to identify possible weight problems. It provides an estimate of body fat based on height and weight. Your health care provider can find your BMI and can help you achieve or maintain a healthy weight.For adults 20 years and older:  A BMI below 18.5 is considered underweight.  A BMI of 18.5 to 24.9 is normal.  A BMI of 25 to 29.9 is considered overweight.  A BMI  of 30 and above is considered obese.  Maintain normal blood lipids and cholesterol levels by exercising and minimizing your intake of saturated fat. Eat a balanced diet with plenty of fruit and vegetables. Blood tests for lipids and cholesterol should begin at age 20 and be repeated every 5 years. If your lipid or cholesterol levels are high, you are over 50, or you are at high risk for heart disease, you may need your cholesterol levels checked more frequently.Ongoing high lipid and cholesterol levels should be treated with medicines if diet and exercise are not working.  If you smoke, find out from your health care provider how to quit. If you do not use tobacco, do not start.  Lung cancer screening is recommended for adults aged 55-80 years who are at high risk for developing lung cancer because of a history of smoking. A yearly low-dose CT scan of the lungs is recommended for people who have at least a 30-pack-year history of smoking and are a current smoker or have quit within the past 15 years. A pack year of smoking is smoking an average of 1 pack of cigarettes a day for 1 year (for example: 1 pack a day for 30 years or 2 packs a day for 15 years). Yearly screening should continue until the smoker has stopped smoking for at least 15 years. Yearly screening should be stopped for people who develop a health problem that would prevent them from having lung cancer treatment.  If you choose to drink alcohol, do not have more than   2 drinks per day. One drink is considered to be 12 ounces (355 mL) of beer, 5 ounces (148 mL) of wine, or 1.5 ounces (44 mL) of liquor.  Avoid use of street drugs. Do not share needles with anyone. Ask for help if you need support or instructions about stopping the use of drugs.  High blood pressure causes heart disease and increases the risk of stroke. Your blood pressure should be checked at least every 1-2 years. Ongoing high blood pressure should be treated with  medicines, if weight loss and exercise are not effective.  If you are 45-79 years old, ask your health care provider if you should take aspirin to prevent heart disease.  Diabetes screening involves taking a blood sample to check your fasting blood sugar level. Testing should be considered at a younger age or be carried out more frequently if you are overweight and have at least 1 risk factor for diabetes.  Colorectal cancer can be detected and often prevented. Most routine colorectal cancer screening begins at the age of 50 and continues through age 75. However, your health care provider may recommend screening at an earlier age if you have risk factors for colon cancer. On a yearly basis, your health care provider may provide home test kits to check for hidden blood in the stool. Use of a small camera at the end of a tube to directly examine the colon (sigmoidoscopy or colonoscopy) can detect the earliest forms of colorectal cancer. Talk to your health care provider about this at age 50, when routine screening begins. Direct exam of the colon should be repeated every 5-10 years through age 75, unless early forms of precancerous polyps or small growths are found.  Hepatitis C blood testing is recommended for all people born from 1945 through 1965 and any individual with known risks for hepatitis C.  New guidelines recommend a once time screening for HIV.   Screening for abdominal aortic aneurysm (AAA)  by ultrasound is recommended for people who have history of high blood pressure or who are current or former smokers.  Healthy men should  receive prostate-specific antigen (PSA) blood tests as part of routine cancer screening. Talk with your health care provider about prostate cancer screening.  Testicular cancer screening is  recommended for adult males. Screening includes self-exam, a health care provider exam, and other screening tests. Consult with your health care provider about any symptoms you  have or any concerns you have about testicular cancer.  Use sunscreen. Apply sunscreen liberally and repeatedly throughout the day. You should seek shade when your shadow is shorter than you. Protect yourself by wearing long sleeves, pants, a wide-brimmed hat, and sunglasses year round, whenever you are outdoors.  Once a month, do a whole-body skin exam, using a mirror to look at the skin on your back. Tell your health care provider about new moles, moles that have irregular borders, moles that are larger than a pencil eraser, or moles that have changed in shape or color.  Stay current with required vaccines (immunizations).  Influenza vaccine. All adults should be immunized every year.  Tetanus, diphtheria, and acellular pertussis (Td, Tdap) vaccine. An adult who has not previously received Tdap or who does not know his vaccine status should receive 1 dose of Tdap. This initial dose should be followed by tetanus and diphtheria toxoids (Td) booster doses every 10 years. Adults with an unknown or incomplete history of completing a 3-dose immunization series with Td-containing vaccines should begin or   complete a primary immunization series including a Tdap dose. Adults should receive a Td booster every 10 years.  Zoster vaccine. One dose is recommended for adults aged 60 years or older unless certain conditions are present.    PREVNAR - Pneumococcal 13-valent conjugate (PCV13) vaccine. When indicated, a person who is uncertain of his immunization history and has no record of immunization should receive the PCV13 vaccine. An adult aged 19 years or older who has certain medical conditions and has not been previously immunized should receive 1 dose of PCV13 vaccine. This PCV13 should be followed with a dose of pneumococcal polysaccharide (PPSV23) vaccine. The PPSV23 vaccine dose should be obtained at least 8 weeks after the dose of PCV13 vaccine. An adult aged 19 years or older who has certain medical  conditions and previously received 1 or more doses of PPSV23 vaccine should receive 1 dose of PCV13. The PCV13 vaccine dose should be obtained 1 or more years after the last PPSV23 vaccine dose.    PNEUMOVAX - Pneumococcal polysaccharide (PPSV23) vaccine. When PCV13 is also indicated, PCV13 should be obtained first. All adults aged 65 years and older should be immunized. An adult younger than age 65 years who has certain medical conditions should be immunized. Any person who resides in a nursing home or long-term care facility should be immunized. An adult smoker should be immunized. People with an immunocompromised condition and certain other conditions should receive both PCV13 and PPSV23 vaccines. People with human immunodeficiency virus (HIV) infection should be immunized as soon as possible after diagnosis. Immunization during chemotherapy or radiation therapy should be avoided. Routine use of PPSV23 vaccine is not recommended for American Indians, [place] Natives, or people younger than 65 years unless there are medical conditions that require PPSV23 vaccine. When indicated, people who have unknown immunization and have no record of immunization should receive PPSV23 vaccine. One-time revaccination 5 years after the first dose of PPSV23 is recommended for people aged 19-64 years who have chronic kidney failure, nephrotic syndrome, asplenia, or immunocompromised conditions. People who received 1-2 doses of PPSV23 before age 65 years should receive another dose of PPSV23 vaccine at age 65 years or later if at least 5 years have passed since the previous dose. Doses of PPSV23 are not needed for people immunized with PPSV23 at or after age 65 years.    Hepatitis A vaccine. Adults who wish to be protected from this disease, have certain high-risk conditions, work with hepatitis A-infected animals, work in hepatitis A research labs, or travel to or work in countries with a high rate of hepatitis A should be  immunized. Adults who were previously unvaccinated and who anticipate close contact with an international adoptee during the first 60 days after arrival in the [place] from a country with a high rate of hepatitis A should be immunized.    Hepatitis B vaccine. Adults should be immunized if they wish to be protected from this disease, have certain high-risk conditions, may be exposed to blood or other infectious body fluids, are household contacts or sex partners of hepatitis B positive people, are clients or workers in certain care facilities, or travel to or work in countries with a high rate of hepatitis B.   Preventive Service / Frequency   Ages 65 and over  Blood pressure check.  Lipid and cholesterol check.  Lung cancer screening. / Every year if you are aged 55-80 years and have a 30-pack-year history of smoking and currently smoke or have quit   within the past 15 years. Yearly screening is stopped once you have quit smoking for at least 15 years or develop a health problem that would prevent you from having lung cancer treatment.  Fecal occult blood test (FOBT) of stool. You may not have to do this test if you get a colonoscopy every 10 years.  Flexible sigmoidoscopy** or colonoscopy.** / Every 5 years for a flexible sigmoidoscopy or every 10 years for a colonoscopy beginning at age 50 and continuing until age 75.  Hepatitis C blood test.** / For all people born from 1945 through 1965 and any individual with known risks for hepatitis C.  Abdominal aortic aneurysm (AAA) screening./ Screening current or former smokers or have Hypertension.  Skin self-exam. / Monthly.  Influenza vaccine. / Every year.  Tetanus, diphtheria, and acellular pertussis (Tdap/Td) vaccine.** / 1 dose of Td every 10 years.   Zoster vaccine.** / 1 dose for adults aged 60 years or older.         Pneumococcal 13-valent conjugate (PCV13) vaccine.    Pneumococcal polysaccharide (PPSV23) vaccine.      Hepatitis A vaccine.** / Consult your health care provider.  Hepatitis B vaccine.** / Consult your health care provider. Screening for abdominal aortic aneurysm (AAA)  by ultrasound is recommended for people who have history of high blood pressure or who are current or former smokers.   

## 2015-02-26 NOTE — Progress Notes (Signed)
MEDICARE ANNUAL WELLNESS VISIT AND FOLLOW UP Assessment:   1. Essential hypertension - continue medications, DASH diet, exercise and monitor at home. Call if greater than 130/80.  - CBC with Differential/Platelet - BASIC METABOLIC PANEL WITH GFR - Hepatic function panel - TSH  2. Paroxysmal atrial fibrillation (HCC) Continue to monitor, continue lopressor and bASA  3. Chronic obstructive pulmonary disease, unspecified COPD type ([place]) Continue spiriva  4. Type 2 diabetes mellitus with stage 2 chronic kidney disease, without long-term current use of insulin ([place]) Discussed general issues about diabetes pathophysiology and management., Educational material distributed., Suggested low cholesterol diet., Encouraged aerobic exercise., Discussed foot care., Reminded to get yearly retinal exam. - Hemoglobin A1c  5. Hyperlipidemia -continue medications, check lipids, decrease fatty foods, increase activity.  - Lipid panel  6. Legal blindness -macular degeneration with retinopathy monitor  7. Hearing loss, bilateral Continue hearing aids  8. Melanoma of skin ([place]) monitor  9. BPH/prostatism Symptoms controlled  10. Pulmonary nodule, right upper lobe, 1cm, new since 2012 Declines further follow up.   11. Obesity Obesity with co morbidities- long discussion about weight loss, diet, and exercise  12. History of splenectomy-for hereditary spherocytosis Monitor, declines vaccinations at this time  51. Encounter for Medicare annual wellness exam  14. Encounter for long-term (current) use of medications - Magnesium  15. At high risk for falls Fall education provided and discussed, no anticoagulation other than bASA due to fall risk.   16. Vitamin D deficiency - Vit D  25 hydroxy (rtn osteoporosis monitoring)    Plan:   During the course of the visit the patient was educated and counseled about appropriate screening and preventive services including:    Pneumococcal  vaccine   Influenza vaccine  Td vaccine  Screening electrocardiogram  Colorectal cancer screening  Diabetes screening  Glaucoma screening  Nutrition counseling   Conditions/risks identified: BMI: Discussed weight loss, diet, and increase physical activity.  Increase physical activity: AHA recommends 150 minutes of physical activity a week.  Medications reviewed Diabetes is not at goal, ACE/ARB therapy: Yes. Urinary Incontinence is not an issue: discussed non pharmacology and pharmacology options.  Fall risk: high- discussed PT, home fall assessment, medications.    Subjective:  [name] is a 74 y.o. male who presents for Medicare Annual Wellness Visit and 3 month follow up for HTN, hyperlipidemia, diabetes and vitamin D Def.  Date of last medicare wellness visit was 01/2014  His blood pressure has been controlled at home, today their BP is BP: 130/60 mmHg He does not workout. He denies chest pain, shortness of breath, dizziness.  He is on cholesterol medication, lipitor 40 and denies myalgias. His cholesterol is at goal. The cholesterol last visit was:   Lab Results  Component Value Date   CHOL 98 11/13/2014   HDL 20* 11/13/2014   LDLCALC 49 11/13/2014   TRIG 143 11/13/2014   CHOLHDL 4.9 11/13/2014   He has been working on diet and exercise for diabetes with CKD stage 2 and retinopathy, he is on ACE, metformin but has been out of it for several weeks, he is on bASA and denies hypoglycemia , paresthesia of the feet, polydipsia and polyuria. Last A1C in the office was:  Lab Results  Component Value Date   HGBA1C 6.6* 11/13/2014   Lab Results  Component Value Date   GFRNONAA 79 11/13/2014   Patient is on Vitamin D supplement.   Lab Results  Component Value Date   VD25OH 73 11/13/2014  He has COPD and had recent CT chest 02/2013 for a nodule following with Dr. Wayland Denis and he has declined any other work up. He has dyspnea with walking to his mailbox but  this is unchanged, he is on spirva daily.   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   Names of Other Physician/Practitioners you currently use: 1. [facility] here for primary care 2. Dr. Delman Cheadle, eye doctor, last visit 8-10 years 3. No dentist.  Patient Care Team: Unk Pinto, MD as PCP - General (Internal Medicine) Minus Breeding, MD as Consulting Physician (Cardiology) Gatha Mayer, MD as Consulting Physician (Gastroenterology) Carolan Clines, MD as Consulting Physician (Urology) Crista Luria, MD as Consulting Physician (Dermatology) Brand Males, MD as Consulting Physician (Pulmonary Disease)  Medication Review: Current Outpatient Prescriptions on File Prior to Visit  Medication Sig Dispense Refill  . aspirin EC 81 MG tablet Take 81 mg by mouth daily.      Marland Kitchen atorvastatin (LIPITOR) 40 MG tablet TAKE 1 TABLET EVERY DAY FOR CHOLESTEROL 90 tablet 1  . benazepril (LOTENSIN) 40 MG tablet TAKE 1 TABLET BY MOUTH EVERY DAY 90 tablet 2  . calcium-vitamin D (OSCAL WITH D) 500-200 MG-UNIT per tablet Take 1 tablet by mouth daily.      . clobetasol ointment (TEMOVATE) 0.05 % Apply to eczema rash 2-3 x/da 30 g 99  . Flaxseed, Linseed, (FLAXSEED OIL) 1000 MG CAPS Take 1,000 mg by mouth daily.    . Fluticasone Furoate-Vilanterol 100-25 MCG/INH AEPB Do 1-2 inhalations once daily 60 each 2  . glucose blood test strip Test blood sugar 3 times daily or as directed for fluctuating blood sugars. DX 250.00 100 each 6  . guaiFENesin (MUCINEX) 600 MG 12 hr tablet Take 1,200 mg by mouth 2 (two) times daily.      Marland Kitchen HYDROcodone-acetaminophen (NORCO) 5-325 MG per tablet Take 1 tablet by mouth every 6 (six) hours as needed for moderate pain (for cough). 60 tablet 0  . metoprolol tartrate (LOPRESSOR) 25 MG tablet Take 1 tablet (25 mg total) by mouth 2 (two) times daily. 180 tablet 1  .  metoprolol tartrate (LOPRESSOR) 25 MG tablet TAKE 1 TABLET BY MOUTH TWICE A DAY 60 tablet 3  . Omega-3 Fatty Acids (FISH OIL) 1000 MG CAPS Take by mouth daily.    Marland Kitchen tiotropium (SPIRIVA) 18 MCG inhalation capsule Place 18 mcg into inhaler and inhale daily.     . traMADol (ULTRAM) 50 MG tablet TAKE 1 TABLET 4 TIMES A DAY AS NEEDED PAIN 90 tablet 0   No current facility-administered medications on file prior to visit.    Current Problems (verified) Patient Active Problem List   Diagnosis Date Noted  . Hearing loss 11/13/2014  . Atrial fibrillation ([place]) 11/13/2014  . At high risk for falls 11/13/2014  . Encounter for Medicare annual wellness exam 11/07/2014  . Legal blindness -macular degeneration with retinopathy 08/06/2014  . Pulmonary nodule, right upper lobe, 1cm, new since 2012 03/21/2013  . Essential hypertension 03/09/2013  . Vitamin D deficiency 03/09/2013  . Encounter for long-term (current) use of medications 03/09/2013  . Melanoma of skin ([place]) 12/28/2012  . History of splenectomy-for hereditary spherocytosis 12/28/2012  . 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 Hyperlipidemia 10/03/2008  . Obesity 10/03/2008  . COPD (chronic obstructive pulmonary disease) ([place]) 10/03/2008  . BPH/prostatism 10/03/2008    Screening Tests Health Maintenance  Topic Date Due  . OPHTHALMOLOGY EXAM  05/08/1950  . ZOSTAVAX  05/08/1998  . TETANUS/TDAP  04/27/2012  . INFLUENZA VACCINE  11/26/2014  . HEMOGLOBIN A1C  05/16/2015  . FOOT EXAM  11/13/2015  . URINE MICROALBUMIN  11/13/2015  . PNA vac Low Risk Adult (2 of 2 - PPSV23) 11/13/2015    Immunization History  Administered Date(s) Administered  . Pneumococcal Conjugate-13 11/13/2014  . Pneumococcal-Unspecified 04/27/2001  . Td 04/27/2002   Preventative care: Last colonoscopy: 2010  Prior vaccinations: TD or Tdap: 2004, declines  Influenza: declines Prevnar 13: 2016 Pneumococcal: 2003- states got at [facility] regional Shingles/Zostavax:  declines  Allergies Allergies  Allergen Reactions  . Crestor [Rosuvastatin]   . Latex Itching  . Neomycin-Bacitracin Zn-Polymyx Other (See Comments)    Causes blisters  . Ciprofloxacin Rash   Surgical history Past Surgical History  Procedure Laterality Date  . Bladder repair    . Cholecystectomy    . Splenectomy, total     Family history Family History  Problem Relation Age of Onset  . Heart disease Mother   . Heart disease Father    Risk Factors: Tobacco Social History  Substance Use Topics  . Smoking status: Former Smoker -- 3.00 packs/day for 45 years    Types: Cigarettes    Quit date: 04/27/1994  . Smokeless tobacco: None  . Alcohol Use: No   MEDICARE WELLNESS OBJECTIVES: Tobacco use: He does not smoke.  Patient is a former smoker. If yes, counseling given Alcohol Current alcohol use: none Osteoporosis: dietary calcium and/or vitamin D deficiency, History of fracture in the past year: yes Fall risk: High Risk Hearing: impaired Visual acuity: impaired Diet: in general, a "healthy" diet   Physical activity: Current Exercise Habits:: The patient does not participate in regular exercise at present Cardiac risk factors: Cardiac Risk Factors include: advanced age (>48men, >36 women);dyslipidemia;diabetes mellitus;hypertension;male gender;obesity (BMI >30kg/m2);sedentary lifestyle Depression/mood screen:   Depression screen [facility] 2/9 02/26/2015  Decreased Interest 0  Down, Depressed, Hopeless 0  PHQ - 2 Score 0    ADLs:  In your present state of health, do you have any difficulty performing the following activities: 02/26/2015  Hearing? Y  Vision? Y  Difficulty concentrating or making decisions? Y  Walking or climbing stairs? Y  Dressing or bathing? N  Doing errands, shopping? Y  Preparing Food and eating ? N  Using the Toilet? N  In the past six months, have you accidently leaked urine? N  Do you have problems with loss of bowel control? N  Managing your  Medications? N  Managing your Finances? N  Housekeeping or managing your Housekeeping? N    Cognitive Testing  Alert? Yes  Normal Appearance?Yes  Oriented to person? Yes  Place? Yes   Time? Yes  Recall of three objects?  2/3  Can perform simple calculations? Yes  Displays appropriate judgment?Yes  Can read the correct time from a watch face?Yes  EOL planning: Does patient have an advance directive?: Yes Type of Advance Directive: [place], Living will Does patient want to make changes to advanced directive?: No - Patient declined Copy of advanced directive(s) in chart?: No - copy requested    Objective:   Blood pressure 130/60, pulse 67, temperature 97.5 F (36.4 C), temperature source Temporal, resp. rate 14, height 5\' 10"  (1.778 m), weight 190 lb (86.183 kg). Body mass index is 27.26 kg/(m^2).  General appearance: alert, no distress, WD/WN, male HEENT: normocephalic, sclerae anicteric, TMs pearly, nares patent,  no discharge or erythema, pharynx normal Oral cavity: MMM, no lesions Neck: supple, no lymphadenopathy, no thyromegaly, no masses Heart: RRR, normal S1, S2, 2/6 systolic murmur Lungs: Bilateral rhonci, no distress Abdomen: +bs, soft, obese non tender, non distended, no masses, no hepatomegaly Musculoskeletal: nontender, no swelling, no obvious deformity Extremities: 1-2+ edema, no cyanosis, no clubbing Pulses: 2+ symmetric upper , decreased bilateral lower extremities, normal cap refill Neurological: alert, oriented x 3, CN2-12 intact, strength 4/5 upper extremities and lower extremities, sensation normal throughout, DTRs 2+ throughout, no cerebellar signs, gait analgic Psychiatric: normal affect, behavior normal, pleasant   Medicare Attestation I have personally reviewed: The patient's medical and social history Their use of alcohol, tobacco or illicit drugs Their current medications and supplements The patient's functional ability including  ADLs,fall risks, home safety risks, cognitive, and hearing and visual impairment Diet and physical activities Evidence for depression or mood disorders  The patient's weight, height, BMI, and visual acuity have been recorded in the chart.  I have made referrals, counseling, and provided education to the patient based on review of the above and I have provided the patient with a written personalized care plan for preventive services.     Vicie Mutters, PA-C   02/26/2015

## 2015-02-27 LAB — VITAMIN D 25 HYDROXY (VIT D DEFICIENCY, FRACTURES): VIT D 25 HYDROXY: 74 ng/mL (ref 30–100)

## 2015-04-01 ENCOUNTER — Ambulatory Visit (INDEPENDENT_AMBULATORY_CARE_PROVIDER_SITE_OTHER): Payer: Medicare Other | Admitting: Physician Assistant

## 2015-04-01 ENCOUNTER — Encounter: Payer: Self-pay | Admitting: Physician Assistant

## 2015-04-01 VITALS — BP 120/70 | HR 72 | Temp 99.3°F | Resp 14 | Ht 70.0 in | Wt 188.0 lb

## 2015-04-01 DIAGNOSIS — T161XXA Foreign body in right ear, initial encounter: Secondary | ICD-10-CM | POA: Diagnosis not present

## 2015-04-01 MED ORDER — HYDROCORTISONE-ACETIC ACID 1-2 % OT SOLN: 5.0000 [drp] | Freq: Four times a day (QID) | OTIC | Status: AC

## 2015-04-01 NOTE — Patient Instructions (Signed)
Ear Foreign Body °An ear foreign body is an object that is stuck in your ear. The object is usually stuck in the ear canal. °CAUSES °In all ages of people, the most common foreign bodies are insects that enter the ear canal. It is common for young children to put objects into the ear canal. These may include pebbles, beads, parts of toys, and any other small objects that fit into the ear. In adults, objects such as cotton swabs may become lodged in the ear canal.  °SIGNS AND SYMPTOMS °A foreign body in the ear may cause: °· Pain. °· Buzzing or roaring sounds. °· Hearing loss. °· Ear drainage or bleeding. °· Nausea and vomiting. °· A feeling that your ear is full. °DIAGNOSIS °Your health care provider may be able to diagnose an ear foreign body based on the information that you provide, your symptoms, and a physical exam. Your health care provider may also perform tests, such as testing your hearing and your ear pressure, to check for infection or other problems that are caused by the foreign body in your ear. °TREATMENT °Treatment depends on what the foreign body is, the location of the foreign body in your ear, and whether or not the foreign body has injured any part of your inner ear. If the foreign body is visible to your health care provider, it may be possible to remove the foreign body using: °· A tool, such as medical tweezers (forceps) or a suction tube (catheter). °· Irrigation. This uses water to flush the foreign body out of your ear. This is used only if the foreign body is not likely to swell or enlarge when it is put in water. °If the foreign body is not visible or your health care provider was not able to remove the foreign body, you may be referred to a specialist for removal. You may also be prescribed antibiotic medicine or ear drops to prevent infection. If the foreign body has caused injury to other parts of your ear, you may need additional treatment. °HOME CARE INSTRUCTIONS °· Keep all  follow-up visits as directed by your health care provider. This is important. °· Take medicines only as directed by your health care provider. °· If you were prescribed an antibiotic medicine, finish it all even if you start to feel better. °PREVENTION °· Keep small objects out of reach of young children. Tell children not to put anything in their ears. °· Do not put anything in your ear, including cotton swabs, to clean your ears. Talk to your health care provider about how to clean your ears safely. °SEEK MEDICAL CARE IF: °· You have a headache. °· Your have blood coming from your ear. °· You have a fever. °· You have increased pain or swelling of your ear. °· Your hearing is reduced. °· You have discharge coming from your ear. °  °This information is not intended to replace advice given to you by your health care provider. Make sure you discuss any questions you have with your health care provider. °  °Document Released: 04/10/2000 Document Revised: 05/04/2014 Document Reviewed: 11/27/2013 °Elsevier Interactive Patient Education ©2016 [employer] ° °

## 2015-04-01 NOTE — Progress Notes (Signed)
Subjective:    Patient ID: [name], male    DOB: 1940/09/30, 74 y.o.   MRN: [id number]  HPI 74 y.o. WM presents with right ear pain. He states he found the plastic tip of his hearing aid was missing, he was putting in new hearing aids when he felt severe pain in his right ear. He now has right ear pain, draining from his ear, popping in his right ear. Has mild cough and some post nasal drip. No fever, chills.   Blood pressure 120/70, pulse 72, temperature 99.3 F (37.4 C), temperature source Temporal, resp. rate 14, height 5\' 10"  (1.778 m), weight 188 lb (85.276 kg), SpO2 95 %.  Past Medical History  Diagnosis Date  . COPD (chronic obstructive pulmonary disease) ([place])   . Emphysema   . Hypertension   . Diabetes mellitus   . Hypercholesteremia    Current Outpatient Prescriptions on File Prior to Visit  Medication Sig Dispense Refill  . atorvastatin (LIPITOR) 40 MG tablet TAKE 1 TABLET EVERY DAY FOR CHOLESTEROL 90 tablet 1  . benazepril (LOTENSIN) 40 MG tablet TAKE 1 TABLET BY MOUTH EVERY DAY 90 tablet 2  . calcium-vitamin D (OSCAL WITH D) 500-200 MG-UNIT per tablet Take 1 tablet by mouth daily.      . clobetasol ointment (TEMOVATE) 0.05 % Apply to eczema rash 2-3 x/da 60 g 99  . Flaxseed, Linseed, (FLAXSEED OIL) 1000 MG CAPS Take 1,000 mg by mouth daily.    . Fluticasone Furoate-Vilanterol 100-25 MCG/INH AEPB Do 1-2 inhalations once daily 60 each 2  . glucose blood test strip Test blood sugar 3 times daily or as directed for fluctuating blood sugars. DX 250.00 100 each 6  . guaiFENesin (MUCINEX) 600 MG 12 hr tablet Take 1,200 mg by mouth 2 (two) times daily.      Marland Kitchen HYDROcodone-acetaminophen (NORCO) 5-325 MG per tablet Take 1 tablet by mouth every 6 (six) hours as needed for moderate pain (for cough). 60 tablet 0  . metFORMIN (GLUCOPHAGE) 500 MG tablet Take 1 - 2 tablets twice daily with food 360 tablet 1  . metoprolol tartrate (LOPRESSOR) 25 MG tablet Take 1 tablet (25 mg total)  by mouth 2 (two) times daily. 180 tablet 1  . Omega-3 Fatty Acids (FISH OIL) 1000 MG CAPS Take by mouth daily.    Marland Kitchen tiotropium (SPIRIVA) 18 MCG inhalation capsule Place 18 mcg into inhaler and inhale daily.     . traMADol (ULTRAM) 50 MG tablet TAKE 1 TABLET 4 TIMES A DAY AS NEEDED PAIN 90 tablet 0   No current facility-administered medications on file prior to visit.    Review of Systems  Constitutional: Negative.  Negative for fever, chills and fatigue.  HENT: Positive for ear discharge (right ear), ear pain (right ear) and hearing loss (wears hearing aids). Negative for congestion, rhinorrhea, sinus pressure and sore throat.   Eyes: Negative for photophobia, pain, discharge and redness.  Respiratory: Negative for cough, shortness of breath and wheezing.   Cardiovascular: Negative.   Gastrointestinal: Negative for nausea, vomiting, abdominal pain, diarrhea (with magnesium), constipation and blood in stool.  Genitourinary: Negative.   Musculoskeletal: Negative.   Skin: Negative.   Neurological: Negative for dizziness, tremors and seizures.  Psychiatric/Behavioral: Negative.        Objective:   Physical Exam  HENT:  Right Ear: Right ear exhibits lacerations. A foreign body (removed tip of hearing aid from right ear, at 6 or 7 o clock has bleeding) is present.  No mastoid tenderness. Tympanic membrane is not injected, not scarred, not perforated, not erythematous, not retracted and not bulging. No hemotympanum. Decreased hearing is noted.  Left Ear: Decreased hearing is noted.       Assessment & Plan:  Foreign body removed from right ear with external irritation.  Removed without issues, ear drum intact Vosol HC otic drops

## 2015-04-08 ENCOUNTER — Other Ambulatory Visit: Payer: Self-pay | Admitting: Internal Medicine

## 2015-04-13 ENCOUNTER — Other Ambulatory Visit: Payer: Self-pay | Admitting: Emergency Medicine

## 2015-04-25 ENCOUNTER — Encounter: Payer: Self-pay | Admitting: Internal Medicine

## 2015-04-25 ENCOUNTER — Ambulatory Visit: Payer: Medicare Other | Admitting: Internal Medicine

## 2015-04-25 VITALS — BP 120/82 | HR 60 | Temp 97.5°F | Resp 16 | Ht 70.0 in | Wt 192.6 lb

## 2015-04-25 DIAGNOSIS — H9193 Unspecified hearing loss, bilateral: Secondary | ICD-10-CM

## 2015-04-25 NOTE — Progress Notes (Signed)
  Subjective:    Patient ID: [name], male    DOB: 1941/03/30, 74 y.o.   MRN: [id number]  HPI  This very nice 74 yo WWM with legal blindness from Mac Deg and chronic hearing loss was seen 3 weeks ago for removal of a hearing aid tip lodged in his R EAC which was removed w/o incident. Today he returns for recheck and requests referral to ENT for evaluation to see if his hearing might possibly be improved by a surgery.   Review of Systems  10 point systems review negative except as above.    Objective:   Physical Exam  BP 120/82 mmHg  Pulse 60  Temp(Src) 97.5 F (36.4 C)  Resp 16  Ht 5\' 10"  (1.778 m)  Wt 192 lb 9.6 oz (87.363 kg)  BMI 27.64 kg/m2  HEENT - Lt EAC patent & TM appears benign. Likewise on the Rt.     Assessment & Plan:   1. Hearing loss, bilateral  - Ambulatory referral to ENT   (no Charge OV)

## 2015-05-01 ENCOUNTER — Other Ambulatory Visit: Payer: Self-pay | Admitting: *Deleted

## 2015-05-01 MED ORDER — METOPROLOL TARTRATE 25 MG PO TABS: 25.0000 mg | ORAL_TABLET | Freq: Two times a day (BID) | ORAL | Status: DC

## 2015-05-29 ENCOUNTER — Ambulatory Visit (INDEPENDENT_AMBULATORY_CARE_PROVIDER_SITE_OTHER): Payer: Medicare Other | Admitting: Internal Medicine

## 2015-05-29 ENCOUNTER — Encounter: Payer: Self-pay | Admitting: Internal Medicine

## 2015-05-29 ENCOUNTER — Telehealth: Payer: Self-pay | Admitting: Internal Medicine

## 2015-05-29 VITALS — BP 116/64 | HR 64 | Temp 97.9°F | Resp 16 | Ht 70.0 in | Wt 197.1 lb

## 2015-05-29 DIAGNOSIS — E559 Vitamin D deficiency, unspecified: Secondary | ICD-10-CM

## 2015-05-29 DIAGNOSIS — I10 Essential (primary) hypertension: Secondary | ICD-10-CM

## 2015-05-29 DIAGNOSIS — J449 Chronic obstructive pulmonary disease, unspecified: Secondary | ICD-10-CM

## 2015-05-29 DIAGNOSIS — Z79899 Other long term (current) drug therapy: Secondary | ICD-10-CM | POA: Diagnosis not present

## 2015-05-29 DIAGNOSIS — E1121 Type 2 diabetes mellitus with diabetic nephropathy: Secondary | ICD-10-CM

## 2015-05-29 DIAGNOSIS — E782 Mixed hyperlipidemia: Secondary | ICD-10-CM

## 2015-05-29 LAB — CBC WITH DIFFERENTIAL/PLATELET
Basophils Absolute: 0.2 10*3/uL — ABNORMAL HIGH (ref 0.0–0.1)
Basophils Relative: 1 % (ref 0–1)
EOS ABS: 1 10*3/uL — AB (ref 0.0–0.7)
EOS PCT: 6 % — AB (ref 0–5)
HCT: 47.5 % (ref 39.0–52.0)
Hemoglobin: 16.4 g/dL (ref 13.0–17.0)
LYMPHS ABS: 2.7 10*3/uL (ref 0.7–4.0)
Lymphocytes Relative: 17 % (ref 12–46)
MCH: 29 pg (ref 26.0–34.0)
MCHC: 34.5 g/dL (ref 30.0–36.0)
MCV: 84.1 fL (ref 78.0–100.0)
MONO ABS: 2.3 10*3/uL — AB (ref 0.1–1.0)
MONOS PCT: 14 % — AB (ref 3–12)
MPV: 10.2 fL (ref 8.6–12.4)
Neutro Abs: 10 10*3/uL — ABNORMAL HIGH (ref 1.7–7.7)
Neutrophils Relative %: 62 % (ref 43–77)
PLATELETS: 348 10*3/uL (ref 150–400)
RBC: 5.65 MIL/uL (ref 4.22–5.81)
RDW: 13.9 % (ref 11.5–15.5)
WBC: 16.1 10*3/uL — ABNORMAL HIGH (ref 4.0–10.5)

## 2015-05-29 LAB — HEMOGLOBIN A1C
Hgb A1c MFr Bld: 6.5 % — ABNORMAL HIGH (ref <5.7)
Mean Plasma Glucose: 140 mg/dL — ABNORMAL HIGH (ref <117)

## 2015-05-29 LAB — HEPATIC FUNCTION PANEL
ALBUMIN: 4.1 g/dL (ref 3.6–5.1)
ALT: 15 U/L (ref 9–46)
AST: 13 U/L (ref 10–35)
Alkaline Phosphatase: 55 U/L (ref 40–115)
BILIRUBIN TOTAL: 0.7 mg/dL (ref 0.2–1.2)
Bilirubin, Direct: 0.1 mg/dL (ref <=0.2)
Indirect Bilirubin: 0.6 mg/dL (ref 0.2–1.2)
TOTAL PROTEIN: 6.7 g/dL (ref 6.1–8.1)

## 2015-05-29 LAB — BASIC METABOLIC PANEL WITH GFR
BUN: 14 mg/dL (ref 7–25)
CO2: 30 mmol/L (ref 20–31)
CREATININE: 0.94 mg/dL (ref 0.70–1.18)
Calcium: 9.5 mg/dL (ref 8.6–10.3)
Chloride: 101 mmol/L (ref 98–110)
GFR, Est Non African American: 78 mL/min (ref >=60)
Glucose, Bld: 145 mg/dL — ABNORMAL HIGH (ref 65–99)
Potassium: 4.6 mmol/L (ref 3.5–5.3)
Sodium: 136 mmol/L (ref 135–146)

## 2015-05-29 LAB — TSH: TSH: 1.914 u[IU]/mL (ref 0.350–4.500)

## 2015-05-29 LAB — LIPID PANEL
CHOLESTEROL: 127 mg/dL (ref 125–200)
HDL: 35 mg/dL — AB (ref >=40)
LDL Cholesterol: 66 mg/dL (ref <130)
TRIGLYCERIDES: 129 mg/dL (ref <150)
Total CHOL/HDL Ratio: 3.6 Ratio (ref <=5.0)
VLDL: 26 mg/dL (ref <30)

## 2015-05-29 LAB — MAGNESIUM: MAGNESIUM: 1.4 mg/dL — AB (ref 1.5–2.5)

## 2015-05-29 NOTE — Patient Instructions (Signed)

## 2015-05-29 NOTE — Progress Notes (Signed)
Patient ID: [name], male   DOB: 1940-09-19, 75 y.o.   MRN: [id number]   This very nice 75 y.o. DWM presents for follow up with Hypertension, Hyperlipidemia, T2_NIDDM and Vitamin D Deficiency. 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 Other problems include COPD (quit tob 2002).    Patient is treated for HTN since 2009 & BP has been controlled at home. Today's BP: 116/64 mmHg. Patient has had no complaints of any cardiac type chest pain, palpitations, dyspnea/orthopnea/PND, dizziness, claudication, or dependent edema.   Hyperlipidemia is controlled with diet & meds. Patient denies myalgias or other med SE's. 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 In 2006 his A1c was 7.4%.  Last A1c was  6.4% on 02/26/2015.    Further, the patient also has history of Vitamin D Deficiency of "35" in 2014 and supplements vitamin D without any suspected side-effects. Last vitamin D was  74 on 02/26/2015.  Medication Sig  . atorvastatin  40 MG tablet TAKE 1 TABLET BY MOUTH EVERY DAY FOR CHOLESTEROL  . benazepril  40 MG tablet TAKE 1 TABLET BY MOUTH EVERY DAY  . OSCAL WITH D 500-200 MG-UNIT  Take 1 tablet by mouth daily.    . clobetasol oint (TEMOVATE) 0.05 % Apply to eczema rash 2-3 x/da  . FLAXSEED OIL 1000 MG CAPS Take 1,000 mg by mouth daily.  Memory Dance  100-25 MCG/INH AEPB Do 1-2 inhalations once daily  . guaiFENesin  600 MG 12 hr  Take 1,200 mg by mouth 2 (two) times daily.    Lebron Quam 5-325 MG per tablet Take 1 tablet by mouth every 6 (six) hours as needed for moderate pain (for cough).  . metFORMIN  500 MG tablet Take 1 - 2 tablets twice daily with food  . metoprolol tartrate  25 MG tablet Take 1 tablet (25 mg total) by mouth 2 (two) times daily.   . Omega-3 FISH OIL 1000 MG CAPS Take by mouth daily.  Marland Kitchen SPIRIVA 18 MCG inhalation cap Place 18 mcg into inhaler and inhale daily.   . traMADol  50 MG tablet TAKE 1 TABLET 4 TIMES A DAY AS NEEDED PAIN   Allergies  Allergen Reactions  . Crestor [Rosuvastatin]   . Latex Itching  . Neomycin-Bacitracin Zn-Polymyx Other (See Comments)    Causes blisters  . Ciprofloxacin Rash   PMHx:   Past Medical History  Diagnosis Date  . COPD (chronic obstructive pulmonary disease) ([place])   . Emphysema   . Hypertension   . Diabetes mellitus   . Hypercholesteremia    Immunization History  Administered Date(s) Administered  . Pneumococcal Conjugate-13 11/13/2014  . Pneumococcal-Unspecified 04/27/2001  . Td 04/27/2002   Past Surgical History  Procedure Laterality Date  . Bladder repair    . Cholecystectomy    . Splenectomy, total     FHx:    Reviewed / unchanged  SHx:    Reviewed / unchanged  Systems Review:  Constitutional: Denies fever, chills, wt changes, headaches, insomnia, fatigue, night sweats, change in appetite. Eyes: Denies redness, blurred vision, diplopia, discharge, itchy, watery eyes.  ENT: Denies discharge, congestion, post nasal drip, epistaxis, sore throat, earache, dental pain, tinnitus, vertigo, sinus pain, snoring. Has severe hearing loss. CV:  Denies chest pain, palpitations, irregular heartbeat, syncope, dyspnea, diaphoresis, orthopnea, PND, claudication or edema. Respiratory: denies cough, dyspnea,  pleurisy, hoarseness, laryngitis, wheezing. Has DOE. Denies sputum production  Gastrointestinal: Denies dysphagia, odynophagia, heartburn, reflux, water brash, abdominal pain or cramps, nausea, vomiting, bloating, diarrhea, constipation, hematemesis, melena, hematochezia  or hemorrhoids. Genitourinary: Denies dysuria, frequency, urgency, nocturia, hesitancy, discharge, hematuria or flank pain. Musculoskeletal: Denies arthralgias, myalgias, stiffness, jt. swelling, pain,  limping or strain/sprain.  Skin: Denies pruritus, rash, hives, warts, acne, eczema or change in skin lesion(s). Neuro: No weakness, tremor, incoordination, spasms, paresthesia or pain. Psychiatric: Denies confusion, memory loss or sensory loss. Endo: Denies change in weight, skin or hair change.  Heme/Lymph: No excessive bleeding, bruising or enlarged lymph nodes.  Physical Exam  BP 116/64 mmHg  Pulse 64  Temp(Src) 97.9 F (36.6 C)  Resp 16  Ht 5\' 10"  (1.778 m)  Wt 197 lb 1.6 oz (89.404 kg)  BMI 28.28 kg/m2  Appears well nourished and in no distress. Eyes: PERRLA, EOMs, conjunctiva no swelling or erythema. VA decreased to hand motion & finger counting.  Sinuses: No frontal/maxillary tenderness ENT/Mouth: EAC's clear, TM's nl w/o erythema, bulging. Nares clear w/o erythema, swelling, exudates. Oropharynx clear without erythema or exudates. Oral hygiene is good. Tongue normal, non obstructing. Hearing intact.  Neck: Supple. Thyroid nl. Car 2+/2+ without bruits, nodes or JVD. Chest: Respirations nl with BS clear & equal w/o rales, rhonchi, wheezing or stridor.  Cor: Heart sounds normal w/ regular rate and rhythm without sig. murmurs, gallops, clicks, or rubs. Peripheral pulses normal and equal  without edema.  Abdomen: Soft & bowel sounds normal. Non-tender w/o guarding, rebound, hernias, masses, or organomegaly.  Lymphatics: Unremarkable.  Musculoskeletal: Full ROM all peripheral extremities, joint stability, 5/5 strength, and normal gait.  Skin: Warm, dry without exposed rashes, lesions or ecchymosis apparent.  Neuro: Cranial nerves intact, reflexes equal bilaterally. Sensory-motor testing grossly intact. Tendon reflexes grossly intact.  Pysch: Alert & oriented x 3.  Insight and judgement nl & appropriate. No ideations.  Assessment and Plan:  1. Essential hypertension  - TSH  2. Hyperlipidemia  - Lipid panel - TSH  3. Type 2 diabetes mellitus with diabetic nephropathy,  without long-term current use of insulin (HCC)  - Hemoglobin A1c - Insulin, random  4. Vitamin D deficiency  - VITAMIN D 25 Hydroxy   5. Severe chronic obstructive pulmonary disease ([place])   6. Medication management  - CBC with Differential/Platelet - BASIC METABOLIC PANEL WITH GFR - Hepatic function panel - Magnesium   Recommended regular exercise, BP monitoring, weight control, and discussed med and SE's. Recommended labs to assess and monitor clinical status. Further disposition pending results of labs. Over 30 minutes of exam, counseling, chart review was performed

## 2015-05-30 LAB — VITAMIN D 25 HYDROXY (VIT D DEFICIENCY, FRACTURES): VIT D 25 HYDROXY: 57 ng/mL (ref 30–100)

## 2015-05-30 LAB — INSULIN, RANDOM: INSULIN: 20.8 u[IU]/mL — AB (ref 2.0–19.6)

## 2015-05-30 NOTE — Telephone Encounter (Signed)
Error

## 2015-06-04 ENCOUNTER — Other Ambulatory Visit: Payer: Self-pay

## 2015-06-04 MED ORDER — ATORVASTATIN CALCIUM 40 MG PO TABS: ORAL_TABLET | ORAL | Status: DC

## 2015-06-05 ENCOUNTER — Other Ambulatory Visit: Payer: Self-pay | Admitting: *Deleted

## 2015-06-05 MED ORDER — BENAZEPRIL HCL 40 MG PO TABS: 40.0000 mg | ORAL_TABLET | Freq: Every day | ORAL | Status: DC

## 2015-06-06 ENCOUNTER — Other Ambulatory Visit: Payer: Self-pay | Admitting: Internal Medicine

## 2015-06-06 DIAGNOSIS — I10 Essential (primary) hypertension: Secondary | ICD-10-CM

## 2015-06-06 MED ORDER — BENAZEPRIL HCL 40 MG PO TABS: 40.0000 mg | ORAL_TABLET | Freq: Every day | ORAL | Status: DC

## 2015-06-27 ENCOUNTER — Other Ambulatory Visit: Payer: Self-pay | Admitting: *Deleted

## 2015-06-27 MED ORDER — GLUCOSE BLOOD VI STRP
ORAL_STRIP | Status: DC
Start: 2015-06-27 — End: 2016-10-11

## 2015-09-05 ENCOUNTER — Encounter: Payer: Self-pay | Admitting: Internal Medicine

## 2015-09-05 ENCOUNTER — Ambulatory Visit (INDEPENDENT_AMBULATORY_CARE_PROVIDER_SITE_OTHER): Payer: Medicare Other | Admitting: Internal Medicine

## 2015-09-05 VITALS — BP 126/64 | HR 78 | Temp 98.0°F | Resp 16 | Ht 70.0 in | Wt 189.0 lb

## 2015-09-05 DIAGNOSIS — E785 Hyperlipidemia, unspecified: Secondary | ICD-10-CM | POA: Diagnosis not present

## 2015-09-05 DIAGNOSIS — E119 Type 2 diabetes mellitus without complications: Secondary | ICD-10-CM | POA: Diagnosis not present

## 2015-09-05 DIAGNOSIS — Z79899 Other long term (current) drug therapy: Secondary | ICD-10-CM

## 2015-09-05 DIAGNOSIS — J449 Chronic obstructive pulmonary disease, unspecified: Secondary | ICD-10-CM

## 2015-09-05 DIAGNOSIS — I10 Essential (primary) hypertension: Secondary | ICD-10-CM | POA: Diagnosis not present

## 2015-09-05 LAB — HEPATIC FUNCTION PANEL
ALBUMIN: 4.4 g/dL (ref 3.6–5.1)
ALT: 18 U/L (ref 9–46)
AST: 13 U/L (ref 10–35)
Alkaline Phosphatase: 60 U/L (ref 40–115)
BILIRUBIN DIRECT: 0.3 mg/dL — AB (ref <=0.2)
BILIRUBIN TOTAL: 1.2 mg/dL (ref 0.2–1.2)
Indirect Bilirubin: 0.9 mg/dL (ref 0.2–1.2)
Total Protein: 6.9 g/dL (ref 6.1–8.1)

## 2015-09-05 LAB — CBC WITH DIFFERENTIAL/PLATELET
BASOS ABS: 0 {cells}/uL (ref 0–200)
Basophils Relative: 0 %
EOS PCT: 4 %
Eosinophils Absolute: 660 cells/uL — ABNORMAL HIGH (ref 15–500)
HCT: 45.1 % (ref 38.5–50.0)
HEMOGLOBIN: 16 g/dL (ref 13.2–17.1)
LYMPHS PCT: 16 %
Lymphs Abs: 2640 cells/uL (ref 850–3900)
MCH: 29.5 pg (ref 27.0–33.0)
MCHC: 35.5 g/dL (ref 32.0–36.0)
MCV: 83.2 fL (ref 80.0–100.0)
MONO ABS: 2640 {cells}/uL — AB (ref 200–950)
MONOS PCT: 16 %
MPV: 11.5 fL (ref 7.5–12.5)
NEUTROS ABS: 10560 {cells}/uL — AB (ref 1500–7800)
NEUTROS PCT: 64 %
Platelets: 297 10*3/uL (ref 140–400)
RBC: 5.42 MIL/uL (ref 4.20–5.80)
RDW: 15.1 % — AB (ref 11.0–15.0)
WBC: 16.5 10*3/uL — AB (ref 3.8–10.8)

## 2015-09-05 LAB — BASIC METABOLIC PANEL WITH GFR
BUN: 19 mg/dL (ref 7–25)
CHLORIDE: 100 mmol/L (ref 98–110)
CO2: 29 mmol/L (ref 20–31)
CREATININE: 0.9 mg/dL (ref 0.70–1.18)
Calcium: 10.1 mg/dL (ref 8.6–10.3)
GFR, Est Non African American: 82 mL/min (ref >=60)
Glucose, Bld: 98 mg/dL (ref 65–99)
POTASSIUM: 4.7 mmol/L (ref 3.5–5.3)
SODIUM: 140 mmol/L (ref 135–146)

## 2015-09-05 LAB — HEMOGLOBIN A1C
Hgb A1c MFr Bld: 6.1 % — ABNORMAL HIGH (ref <5.7)
MEAN PLASMA GLUCOSE: 128 mg/dL

## 2015-09-05 LAB — LIPID PANEL
CHOLESTEROL: 132 mg/dL (ref 125–200)
HDL: 31 mg/dL — ABNORMAL LOW (ref >=40)
LDL Cholesterol: 77 mg/dL (ref <130)
TRIGLYCERIDES: 120 mg/dL (ref <150)
Total CHOL/HDL Ratio: 4.3 Ratio (ref <=5.0)
VLDL: 24 mg/dL (ref <30)

## 2015-09-05 LAB — TSH: TSH: 1.67 m[IU]/L (ref 0.40–4.50)

## 2015-09-05 MED ORDER — TRAMADOL HCL 50 MG PO TABS: 100.0000 mg | ORAL_TABLET | Freq: Four times a day (QID) | ORAL | Status: DC | PRN

## 2015-09-05 MED ORDER — BUDESONIDE-FORMOTEROL FUMARATE 160-4.5 MCG/ACT IN AERO: 2.0000 | INHALATION_SPRAY | Freq: Two times a day (BID) | RESPIRATORY_TRACT | Status: AC

## 2015-09-05 NOTE — Progress Notes (Signed)
Assessment and Plan:  Hypertension:  -Continue medication,  -monitor blood pressure at home.  -Continue DASH diet.   -Reminder to go to the ER if any CP, SOB, nausea, dizziness, severe HA, changes vision/speech, left arm numbness and tingling, and jaw pain.  Cholesterol: -Continue diet and exercise.  -Check cholesterol.   Pre-diabetes: -Continue diet and exercise.  -Check A1C  Vitamin D Def: -check level -continue medications.   COPD -cont spiriva -cont albuterol -not on LABA ICS combination -start on symbicort 1 puff BID  Continue diet and meds as discussed. Further disposition pending results of labs.  HPI 75 y.o. male  presents for 3 month follow up with hypertension, hyperlipidemia, prediabetes and vitamin D.   His blood pressure has been controlled at home, today their BP is BP: 126/64 mmHg.   He does workout. He denies chest pain, shortness of breath, dizziness. He reports that he has been walking a lot and he notes that it makes his joints hurt a lot.     He is on cholesterol medication and denies myalgias. His cholesterol is at goal. The cholesterol last visit was:   Lab Results  Component Value Date   CHOL 127 05/29/2015   HDL 35* 05/29/2015   LDLCALC 66 05/29/2015   TRIG 129 05/29/2015   CHOLHDL 3.6 05/29/2015     He has been working on diet and exercise for prediabetes, and denies foot ulcerations, hyperglycemia, hypoglycemia , increased appetite, nausea, paresthesia of the feet, polydipsia, polyuria, visual disturbances, vomiting and weight loss. Last A1C in the office was:  Lab Results  Component Value Date   HGBA1C 6.5* 05/29/2015    Patient is on Vitamin D supplement.  Lab Results  Component Value Date   VD25OH 57 05/29/2015      Current Medications:  Current Outpatient Prescriptions on File Prior to Visit  Medication Sig Dispense Refill  . atorvastatin (LIPITOR) 40 MG tablet TAKE 1 TABLET BY MOUTH EVERY DAY FOR CHOLESTEROL 90 tablet 1  .  benazepril (LOTENSIN) 40 MG tablet Take 1 tablet (40 mg total) by mouth daily. 90 tablet 1  . calcium-vitamin D (OSCAL WITH D) 500-200 MG-UNIT per tablet Take 1 tablet by mouth daily.      . clobetasol ointment (TEMOVATE) 0.05 % Apply to eczema rash 2-3 x/da 60 g 99  . Flaxseed, Linseed, (FLAXSEED OIL) 1000 MG CAPS Take 1,000 mg by mouth daily.    . Fluticasone Furoate-Vilanterol 100-25 MCG/INH AEPB Do 1-2 inhalations once daily 60 each 2  . glucose blood test strip Test blood sugar 1 times daily or as directed for fluctuating blood sugars. DX E11.21 100 each 6  . guaiFENesin (MUCINEX) 600 MG 12 hr tablet Take 1,200 mg by mouth 2 (two) times daily.      . metFORMIN (GLUCOPHAGE) 500 MG tablet Take 1 - 2 tablets twice daily with food 360 tablet 1  . metoprolol tartrate (LOPRESSOR) 25 MG tablet Take 1 tablet (25 mg total) by mouth 2 (two) times daily. 180 tablet 1  . Omega-3 Fatty Acids (FISH OIL) 1000 MG CAPS Take by mouth daily.    Marland Kitchen tiotropium (SPIRIVA) 18 MCG inhalation capsule Place 18 mcg into inhaler and inhale daily.     . traMADol (ULTRAM) 50 MG tablet TAKE 1 TABLET 4 TIMES A DAY AS NEEDED PAIN 90 tablet 0   No current facility-administered medications on file prior to visit.    Medical History:  Past Medical History  Diagnosis Date  . COPD (chronic  obstructive pulmonary disease) ([place])   . Emphysema   . Hypertension   . Diabetes mellitus   . Hypercholesteremia     Allergies:  Allergies  Allergen Reactions  . Crestor [Rosuvastatin]   . Latex Itching  . Neomycin-Bacitracin Zn-Polymyx Other (See Comments)    Causes blisters  . Ciprofloxacin Rash     Review of Systems:  Review of Systems  Constitutional: Negative for fever, chills and malaise/fatigue.  HENT: Negative for congestion, ear pain and sore throat.   Eyes: Positive for blurred vision.  Respiratory: Positive for cough, shortness of breath and wheezing. Negative for sputum production.   Cardiovascular: Negative for  chest pain, palpitations and leg swelling.  Gastrointestinal: Negative for heartburn, abdominal pain, diarrhea, constipation, blood in stool and melena.  Genitourinary: Negative.   Skin: Negative.   Neurological: Negative for dizziness, sensory change, loss of consciousness and headaches.  Psychiatric/Behavioral: Negative for depression. The patient is not nervous/anxious and does not have insomnia.     Family history- Review and unchanged  Social history- Review and unchanged  Physical Exam: BP 126/64 mmHg  Pulse 78  Temp(Src) 98 F (36.7 C) (Temporal)  Resp 16  Ht 5\' 10"  (1.778 m)  Wt 189 lb (85.73 kg)  BMI 27.12 kg/m2 Wt Readings from Last 3 Encounters:  09/05/15 189 lb (85.73 kg)  05/29/15 197 lb 1.6 oz (89.404 kg)  04/25/15 192 lb 9.6 oz (87.363 kg)    General Appearance: Well nourished well developed, in no apparent distress. Eyes: PERRLA, EOMs, conjunctiva no swelling or erythema ENT/Mouth: Ear canals normal without obstruction, swelling, erythma, discharge.  TMs normal bilaterally.  Oropharynx moist, clear, without exudate, or postoropharyngeal swelling. Neck: Supple, thyroid normal,no cervical adenopathy  Respiratory: Respiratory effort normal, Breath sounds clear A&P with  rhonchi, wheeze heard best at the lung bases.  No retractions, no accessory usage. Cardio: RRR with no 2/6 systolic murmur heard best on left sternal border. Brisk peripheral pulses without edema.  Abdomen: Soft, + BS,  Non tender, no guarding, rebound, hernias, masses. Musculoskeletal: Full ROM, 5/5 strength, Normal gait Skin: Warm, dry without rashes, lesions, ecchymosis.  Neuro: Awake and oriented X 3, Cranial nerves intact. Normal muscle tone, no cerebellar symptoms. Psych: Normal affect, Insight and Judgment appropriate.    Starlyn Skeans, PA-C 9:54 AM [facility]

## 2015-09-05 NOTE — Patient Instructions (Signed)
Please start using the symbicort inhaler twice daily one puff each time.  Please continue to use your spiriva and albuterol inhalers.   Your target blood pressure range is 120-140 on the top over 60-85 on the bottom.    Your target pulse rate is 60-100.

## 2015-09-23 ENCOUNTER — Other Ambulatory Visit: Payer: Self-pay | Admitting: Internal Medicine

## 2015-09-23 DIAGNOSIS — E119 Type 2 diabetes mellitus without complications: Secondary | ICD-10-CM

## 2015-09-23 MED ORDER — METFORMIN HCL 500 MG PO TABS: ORAL_TABLET | ORAL | Status: DC

## 2015-10-24 ENCOUNTER — Other Ambulatory Visit: Payer: Self-pay | Admitting: Specialist

## 2015-10-24 DIAGNOSIS — R911 Solitary pulmonary nodule: Secondary | ICD-10-CM

## 2015-10-24 DIAGNOSIS — R0602 Shortness of breath: Secondary | ICD-10-CM

## 2015-11-05 ENCOUNTER — Other Ambulatory Visit: Payer: Self-pay | Admitting: Internal Medicine

## 2015-11-14 ENCOUNTER — Encounter: Payer: Self-pay | Admitting: Physician Assistant

## 2015-11-21 ENCOUNTER — Ambulatory Visit
Admission: RE | Admit: 2015-11-21 | Discharge: 2015-11-21 | Disposition: A | Discharge status: Home / Self Care | Payer: Medicare Other | Source: Ambulatory Visit | Attending: Specialist | Admitting: Specialist

## 2015-11-21 DIAGNOSIS — R911 Solitary pulmonary nodule: Secondary | ICD-10-CM | POA: Insufficient documentation

## 2015-11-21 DIAGNOSIS — R918 Other nonspecific abnormal finding of lung field: Secondary | ICD-10-CM | POA: Diagnosis not present

## 2015-11-21 DIAGNOSIS — R0602 Shortness of breath: Secondary | ICD-10-CM | POA: Diagnosis present

## 2015-12-05 ENCOUNTER — Encounter: Payer: Self-pay | Admitting: Internal Medicine

## 2015-12-05 ENCOUNTER — Ambulatory Visit (INDEPENDENT_AMBULATORY_CARE_PROVIDER_SITE_OTHER): Payer: Medicare Other | Admitting: Internal Medicine

## 2015-12-05 VITALS — BP 118/66 | HR 80 | Temp 98.2°F | Resp 16 | Ht 69.0 in | Wt 194.0 lb

## 2015-12-05 DIAGNOSIS — Z125 Encounter for screening for malignant neoplasm of prostate: Secondary | ICD-10-CM

## 2015-12-05 DIAGNOSIS — Z9081 Acquired absence of spleen: Secondary | ICD-10-CM

## 2015-12-05 DIAGNOSIS — J449 Chronic obstructive pulmonary disease, unspecified: Secondary | ICD-10-CM

## 2015-12-05 DIAGNOSIS — E782 Mixed hyperlipidemia: Secondary | ICD-10-CM

## 2015-12-05 DIAGNOSIS — Z0000 Encounter for general adult medical examination without abnormal findings: Secondary | ICD-10-CM | POA: Diagnosis not present

## 2015-12-05 DIAGNOSIS — I10 Essential (primary) hypertension: Secondary | ICD-10-CM

## 2015-12-05 DIAGNOSIS — E119 Type 2 diabetes mellitus without complications: Secondary | ICD-10-CM

## 2015-12-05 DIAGNOSIS — Z79899 Other long term (current) drug therapy: Secondary | ICD-10-CM

## 2015-12-05 DIAGNOSIS — Z136 Encounter for screening for cardiovascular disorders: Secondary | ICD-10-CM

## 2015-12-05 DIAGNOSIS — I480 Paroxysmal atrial fibrillation: Secondary | ICD-10-CM

## 2015-12-05 DIAGNOSIS — H548 Legal blindness, as defined in USA: Secondary | ICD-10-CM

## 2015-12-05 DIAGNOSIS — E669 Obesity, unspecified: Secondary | ICD-10-CM

## 2015-12-05 DIAGNOSIS — Z1212 Encounter for screening for malignant neoplasm of rectum: Secondary | ICD-10-CM

## 2015-12-05 DIAGNOSIS — H9193 Unspecified hearing loss, bilateral: Secondary | ICD-10-CM

## 2015-12-05 DIAGNOSIS — E1121 Type 2 diabetes mellitus with diabetic nephropathy: Secondary | ICD-10-CM

## 2015-12-05 DIAGNOSIS — C439 Malignant melanoma of skin, unspecified: Secondary | ICD-10-CM

## 2015-12-05 DIAGNOSIS — E09311 Drug or chemical induced diabetes mellitus with unspecified diabetic retinopathy with macular edema: Secondary | ICD-10-CM

## 2015-12-05 DIAGNOSIS — E559 Vitamin D deficiency, unspecified: Secondary | ICD-10-CM

## 2015-12-05 DIAGNOSIS — R911 Solitary pulmonary nodule: Secondary | ICD-10-CM

## 2015-12-05 DIAGNOSIS — Z9181 History of falling: Secondary | ICD-10-CM

## 2015-12-05 DIAGNOSIS — N320 Bladder-neck obstruction: Secondary | ICD-10-CM

## 2015-12-05 LAB — CBC WITH DIFFERENTIAL/PLATELET
BASOS PCT: 1 %
Basophils Absolute: 159 cells/uL (ref 0–200)
Eosinophils Absolute: 954 cells/uL — ABNORMAL HIGH (ref 15–500)
Eosinophils Relative: 6 %
HCT: 43.9 % (ref 38.5–50.0)
Hemoglobin: 15.1 g/dL (ref 13.2–17.1)
LYMPHS PCT: 14 %
Lymphs Abs: 2226 cells/uL (ref 850–3900)
MCH: 29.9 pg (ref 27.0–33.0)
MCHC: 34.4 g/dL (ref 32.0–36.0)
MCV: 86.9 fL (ref 80.0–100.0)
MONO ABS: 2226 {cells}/uL — AB (ref 200–950)
MONOS PCT: 14 %
MPV: 10.4 fL (ref 7.5–12.5)
Neutro Abs: 10335 cells/uL — ABNORMAL HIGH (ref 1500–7800)
Neutrophils Relative %: 65 %
PLATELETS: 320 10*3/uL (ref 140–400)
RBC: 5.05 MIL/uL (ref 4.20–5.80)
RDW: 14 % (ref 11.0–15.0)
WBC: 15.9 10*3/uL — AB (ref 3.8–10.8)

## 2015-12-05 LAB — BASIC METABOLIC PANEL WITH GFR
BUN: 20 mg/dL (ref 7–25)
CO2: 29 mmol/L (ref 20–31)
Calcium: 9.6 mg/dL (ref 8.6–10.3)
Chloride: 100 mmol/L (ref 98–110)
Creat: 0.79 mg/dL (ref 0.70–1.18)
GFR, EST NON AFRICAN AMERICAN: 87 mL/min (ref >=60)
Glucose, Bld: 88 mg/dL (ref 65–99)
POTASSIUM: 5 mmol/L (ref 3.5–5.3)
Sodium: 137 mmol/L (ref 135–146)

## 2015-12-05 LAB — LIPID PANEL
CHOL/HDL RATIO: 3.6 ratio (ref <=5.0)
Cholesterol: 111 mg/dL — ABNORMAL LOW (ref 125–200)
HDL: 31 mg/dL — ABNORMAL LOW (ref >=40)
LDL CALC: 55 mg/dL (ref <130)
TRIGLYCERIDES: 126 mg/dL (ref <150)
VLDL: 25 mg/dL (ref <30)

## 2015-12-05 LAB — TSH: TSH: 1.56 m[IU]/L (ref 0.40–4.50)

## 2015-12-05 LAB — HEPATIC FUNCTION PANEL
ALBUMIN: 4.2 g/dL (ref 3.6–5.1)
ALK PHOS: 59 U/L (ref 40–115)
ALT: 15 U/L (ref 9–46)
AST: 13 U/L (ref 10–35)
BILIRUBIN INDIRECT: 0.5 mg/dL (ref 0.2–1.2)
BILIRUBIN TOTAL: 0.6 mg/dL (ref 0.2–1.2)
Bilirubin, Direct: 0.1 mg/dL (ref <=0.2)
Total Protein: 6.7 g/dL (ref 6.1–8.1)

## 2015-12-05 LAB — MAGNESIUM: Magnesium: 1.1 mg/dL — ABNORMAL LOW (ref 1.5–2.5)

## 2015-12-05 LAB — PSA: PSA: 0.2 ng/mL (ref <=4.0)

## 2015-12-05 MED ORDER — METFORMIN HCL 500 MG PO TABS: ORAL_TABLET | ORAL | 1 refills | Status: DC

## 2015-12-05 MED ORDER — BENAZEPRIL HCL 40 MG PO TABS: 40.0000 mg | ORAL_TABLET | Freq: Every day | ORAL | 1 refills | Status: DC

## 2015-12-05 NOTE — Progress Notes (Signed)
Complete Physical  Assessment and Plan:  1. Essential hypertension -well controlled -cont meds -also followed by Cards - Urinalysis, Routine w reflex microscopic (not at [facility]) - Microalbumin / creatinine urine ratio - EKG 12-Lead - TSH - benazepril (LOTENSIN) 40 MG tablet; Take 1 tablet (40 mg total) by mouth daily.  Dispense: 90 tablet; Refill: 1  2. Paroxysmal atrial fibrillation (HCC) -cont rate control -cont eliquis -samples given here today  3. Severe chronic obstructive pulmonary disease (HCC) -followed by Dr. Raul Del -cont spiriva -cont symbicort -gave a spacer here in the office  4. Type 2 diabetes mellitus with diabetic nephropathy, without long-term current use of insulin (HCC) -metformin refilled -cont meds - Hemoglobin A1c - Insulin, random  5. Legal blindness -macular degeneration with retinopathy -followed by opthalmology  6. Hearing loss, bilateral -cont wearing hearing aids  7. Melanoma of skin ([place]) -resolved, but followed by Derm  8. BPH/prostatism -followed by urology  9. Hyperlipidemia -cont atorvastatin -also followed by Cards - Lipid panel  10. Obesity -cont diet and exercise  11. History of splenectomy-for hereditary spherocytosis -followed by hematology  12. Vitamin D deficiency -cont Vit D  - VITAMIN D 25 Hydroxy (Vit-D Deficiency, Fractures)  13. Medication management  - CBC with Differential/Platelet - BASIC METABOLIC PANEL WITH GFR - Hepatic function panel - Magnesium  14. Pulmonary nodule, right upper lobe, 1cm, new since 2012 -followed by Dr. Raul Del  15. At high risk for falls -recommended use of walker and avoid climbing  16. Lung nodule, solitary -cont following with Dr. Raul Del  17. Screening for rectal cancer  - POC Hemoccult Bld/Stl (3-Cd Home Screen); Future  18. Screening for prostate cancer  - PSA  19. Diabetes mellitus without complication (HCC)  - metFORMIN (GLUCOPHAGE) 500 MG tablet; Take 1 -  2 tablets twice daily with food  Dispense: 360 tablet; Refill: 1    Discussed med's effects and SE's. Screening labs and tests as requested with regular follow-up as recommended.  HPI Patient presents for a complete physical.   His blood pressure has been controlled at home, today their BP is BP: 118/66 He does not workout. He denies chest pain, shortness of breath, dizziness.   He is on cholesterol medication and denies myalgias. His cholesterol is at goal. The cholesterol last visit was:   Lab Results  Component Value Date   CHOL 132 09/05/2015   HDL 31 (L) 09/05/2015   LDLCALC 77 09/05/2015   TRIG 120 09/05/2015   CHOLHDL 4.3 09/05/2015    He has been working on diet and exercise for prediabetes, he is on bASA, he is not on ACE/ARB and denies foot ulcerations, hyperglycemia, hypoglycemia , increased appetite, nausea, paresthesia of the feet, polydipsia, polyuria, visual disturbances, vomiting and weight loss. Last A1C in the office was:  Lab Results  Component Value Date   HGBA1C 6.1 (H) 09/05/2015    Patient is on Vitamin D supplement.   Lab Results  Component Value Date   VD25OH 92 05/29/2015   He reports that he has been having a hard time with symbicort as it is making him have coughing.  He does not use a spacer.  He reports that he takes it right before he goes to bed.  He would like to try to use it at a different time so he does not cough before bedtime.   He reports that he did recently see the lung doctor, Dr. Raul Del.  He is also seeing Dr. Clayborn Bigness.  He recently saw  both of them.  They are associated with [facility].  They are following a nodule in the RL.    Last PSA was: No results found for: PSA.  Denies BPH symptoms daytime frequency, dysuria, hematuria, hesitancy, incontinence, intermittency, suprapubic pain, urgency or weak urinary stream.  Current Medications:  Current Outpatient Prescriptions on File Prior to Visit  Medication Sig Dispense Refill   . apixaban (ELIQUIS) 5 MG TABS tablet Take by mouth.    Marland Kitchen atorvastatin (LIPITOR) 40 MG tablet TAKE 1 TABLET BY MOUTH EVERY DAY FOR CHOLESTEROL 90 tablet 1  . benazepril (LOTENSIN) 40 MG tablet Take 1 tablet (40 mg total) by mouth daily. 90 tablet 1  . budesonide-formoterol (SYMBICORT) 160-4.5 MCG/ACT inhaler Inhale 2 puffs into the lungs 2 (two) times daily. 1 Inhaler 12  . calcium-vitamin D (OSCAL WITH D) 500-200 MG-UNIT per tablet Take 1 tablet by mouth daily.      . clobetasol ointment (TEMOVATE) 0.05 % Apply to eczema rash 2-3 x/da 60 g 99  . Flaxseed, Linseed, (FLAXSEED OIL) 1000 MG CAPS Take 1,000 mg by mouth daily.    Marland Kitchen glucose blood test strip Test blood sugar 1 times daily or as directed for fluctuating blood sugars. DX E11.21 100 each 6  . guaiFENesin (MUCINEX) 600 MG 12 hr tablet Take 1,200 mg by mouth 2 (two) times daily.      . metFORMIN (GLUCOPHAGE) 500 MG tablet Take 1 - 2 tablets twice daily with food 360 tablet 1  . metoprolol tartrate (LOPRESSOR) 25 MG tablet TAKE ONE TABLET BY MOUTH TWICE DAILY 180 tablet 0  . Omega-3 Fatty Acids (FISH OIL) 1000 MG CAPS Take by mouth daily.    Marland Kitchen tiotropium (SPIRIVA) 18 MCG inhalation capsule Place 18 mcg into inhaler and inhale daily.     . traMADol (ULTRAM) 50 MG tablet Take 2 tablets (100 mg total) by mouth every 6 (six) hours as needed. 180 tablet 0   No current facility-administered medications on file prior to visit.     Health Maintenance:  Immunization History  Administered Date(s) Administered  . Pneumococcal Conjugate-13 11/13/2014  . Pneumococcal-Unspecified 04/27/2001  . Td 04/27/2002    Tetanus: 2004, declines further due to cost Pneumovax: 2003 Prevnar 13: 2016 Zostavax:Declined due to cost DEXA: Declined Colonoscopy: 2010 Eye Exam:  Follows quarterly due to macular degeneration  Patient Care Team: Unk Pinto, MD as PCP - General (Internal Medicine) Minus Breeding, MD as Consulting Physician  (Cardiology) Gatha Mayer, MD as Consulting Physician (Gastroenterology) Carolan Clines, MD as Consulting Physician (Urology) Crista Luria, MD as Consulting Physician (Dermatology) Brand Males, MD as Consulting Physician (Pulmonary Disease)  Allergies:  Allergies  Allergen Reactions  . Crestor [Rosuvastatin]   . Latex Itching  . Neomycin-Bacitracin Zn-Polymyx Other (See Comments)    Causes blisters  . Ciprofloxacin Rash    Medical History:  Past Medical History:  Diagnosis Date  . COPD (chronic obstructive pulmonary disease) ([place])   . Diabetes mellitus   . Emphysema   . Hypercholesteremia   . Hypertension     Surgical History:  Past Surgical History:  Procedure Laterality Date  . BLADDER REPAIR    . CHOLECYSTECTOMY    . SPLENECTOMY, TOTAL      Family History:  Family History  Problem Relation Age of Onset  . Heart disease Mother   . Heart disease Father     Social History:   Social History  Substance Use Topics  . Smoking status: Former Smoker  Packs/day: 3.00    Years: 45.00    Types: Cigarettes    Quit date: 04/27/1994  . Smokeless tobacco: Not on file  . Alcohol use No    Review of Systems:  Review of Systems  Constitutional: Negative for chills, fever and malaise/fatigue.  HENT: Negative for congestion, ear pain and sore throat.   Eyes: Negative.   Respiratory: Negative for cough, shortness of breath and wheezing.   Cardiovascular: Negative for chest pain, palpitations and leg swelling.  Gastrointestinal: Negative for abdominal pain, blood in stool, constipation, diarrhea, heartburn and melena.  Genitourinary: Negative.   Skin: Negative.   Neurological: Negative for dizziness, sensory change, loss of consciousness and headaches.  Psychiatric/Behavioral: Negative for depression. The patient is not nervous/anxious and does not have insomnia.     Physical Exam: Estimated body mass index is 28.65 kg/m as calculated from the following:    Height as of this encounter: 5\' 9"  (1.753 m).   Weight as of this encounter: 194 lb (88 kg). BP 118/66   Pulse 80   Temp 98.2 F (36.8 C) (Temporal)   Resp 16   Ht 5\' 9"  (1.753 m)   Wt 194 lb (88 kg)   BMI 28.65 kg/m   Wt Readings from Last 3 Encounters:  12/05/15 194 lb (88 kg)  09/05/15 189 lb (85.7 kg)  05/29/15 197 lb 1.6 oz (89.4 kg)     General Appearance: Well nourished, in no apparent distress.  Eyes: PERRLA, EOMs, conjunctiva no swelling or erythema ENT/Mouth: Ear canals clear bilaterally with no erythema, swelling, discharge.  TMs normal bilaterally with no erythema, bulging, or retractions.  Oropharynx clear and moist with no exudate, swelling, or erythema.  Dentition normal.   Neck: Supple, thyroid normal. No bruits, JVD, cervical adenopathy Respiratory: Respiratory effort normal, BS equal bilaterally without rales, rhonchi, wheezing or stridor.  Cardio: RRR without murmurs, rubs or gallops. Brisk peripheral pulses without edema.  Chest: symmetric, with normal excursions Abdomen: Soft, nontender, no guarding, rebound, hernias, masses, or organomegaly. Musculoskeletal: Full ROM all peripheral extremities,5/5 strength, and normal gait.  Skin: Warm, dry without rashes, lesions, ecchymosis. Neuro: A&Ox3, Cranial nerves intact, reflexes equal bilaterally. Normal muscle tone, no cerebellar symptoms. Sensation intact.  Psych: Normal affect, Insight and Judgment appropriate.   EKG: Atrial Fibrillation with no ST changes  Over 40 minutes of exam, counseling, chart review and critical decision making was performed  Loma Sousa Forcucci 10:22 AM [facility]

## 2015-12-06 ENCOUNTER — Encounter: Payer: Self-pay | Admitting: *Deleted

## 2015-12-06 ENCOUNTER — Encounter: Payer: Self-pay | Admitting: Physician Assistant

## 2015-12-06 LAB — MICROALBUMIN / CREATININE URINE RATIO
Creatinine, Urine: 61 mg/dL (ref 20–370)
MICROALB UR: 37.4 mg/dL
MICROALB/CREAT RATIO: 613 ug/mg{creat} — AB (ref <30)

## 2015-12-06 LAB — VITAMIN D 25 HYDROXY (VIT D DEFICIENCY, FRACTURES): VIT D 25 HYDROXY: 78 ng/mL (ref 30–100)

## 2015-12-06 LAB — URINALYSIS, ROUTINE W REFLEX MICROSCOPIC
Bilirubin Urine: NEGATIVE
Glucose, UA: NEGATIVE
Hgb urine dipstick: NEGATIVE
Ketones, ur: NEGATIVE
LEUKOCYTES UA: NEGATIVE
NITRITE: NEGATIVE
PH: 5 (ref 5.0–8.0)
SPECIFIC GRAVITY, URINE: 1.014 (ref 1.001–1.035)

## 2015-12-06 LAB — URINALYSIS, MICROSCOPIC ONLY
BACTERIA UA: NONE SEEN [HPF]
CASTS: NONE SEEN [LPF]
Crystals: NONE SEEN [HPF]
RBC / HPF: NONE SEEN RBC/HPF (ref <=2)
SQUAMOUS EPITHELIAL / LPF: NONE SEEN [HPF] (ref <=5)
WBC, UA: NONE SEEN WBC/HPF (ref <=5)
YEAST: NONE SEEN [HPF]

## 2015-12-06 LAB — HEMOGLOBIN A1C
Hgb A1c MFr Bld: 5.6 % (ref <5.7)
Mean Plasma Glucose: 114 mg/dL

## 2015-12-06 LAB — INSULIN, RANDOM: Insulin: 12.4 u[IU]/mL (ref 2.0–19.6)

## 2016-02-02 ENCOUNTER — Other Ambulatory Visit: Payer: Self-pay | Admitting: Internal Medicine

## 2016-02-03 ENCOUNTER — Other Ambulatory Visit: Payer: Self-pay | Admitting: *Deleted

## 2016-02-03 MED ORDER — METOPROLOL TARTRATE 25 MG PO TABS: 25.0000 mg | ORAL_TABLET | Freq: Two times a day (BID) | ORAL | 1 refills | Status: DC

## 2016-02-05 ENCOUNTER — Encounter: Payer: Self-pay | Admitting: Internal Medicine

## 2016-02-11 IMAGING — CR DG CHEST 2V
1 series · 2 of 2 positions shown · non-contrast
Comparison: CT CHEST W/O CM dated 07/18/2013;

CLINICAL DATA: Shortness of breath, cough, COPD

EXAM:
CHEST  2 VIEW

[Series 1: x chest ap · 0.14mm/px · 2 of 2 slices shown]
[im 1/2]
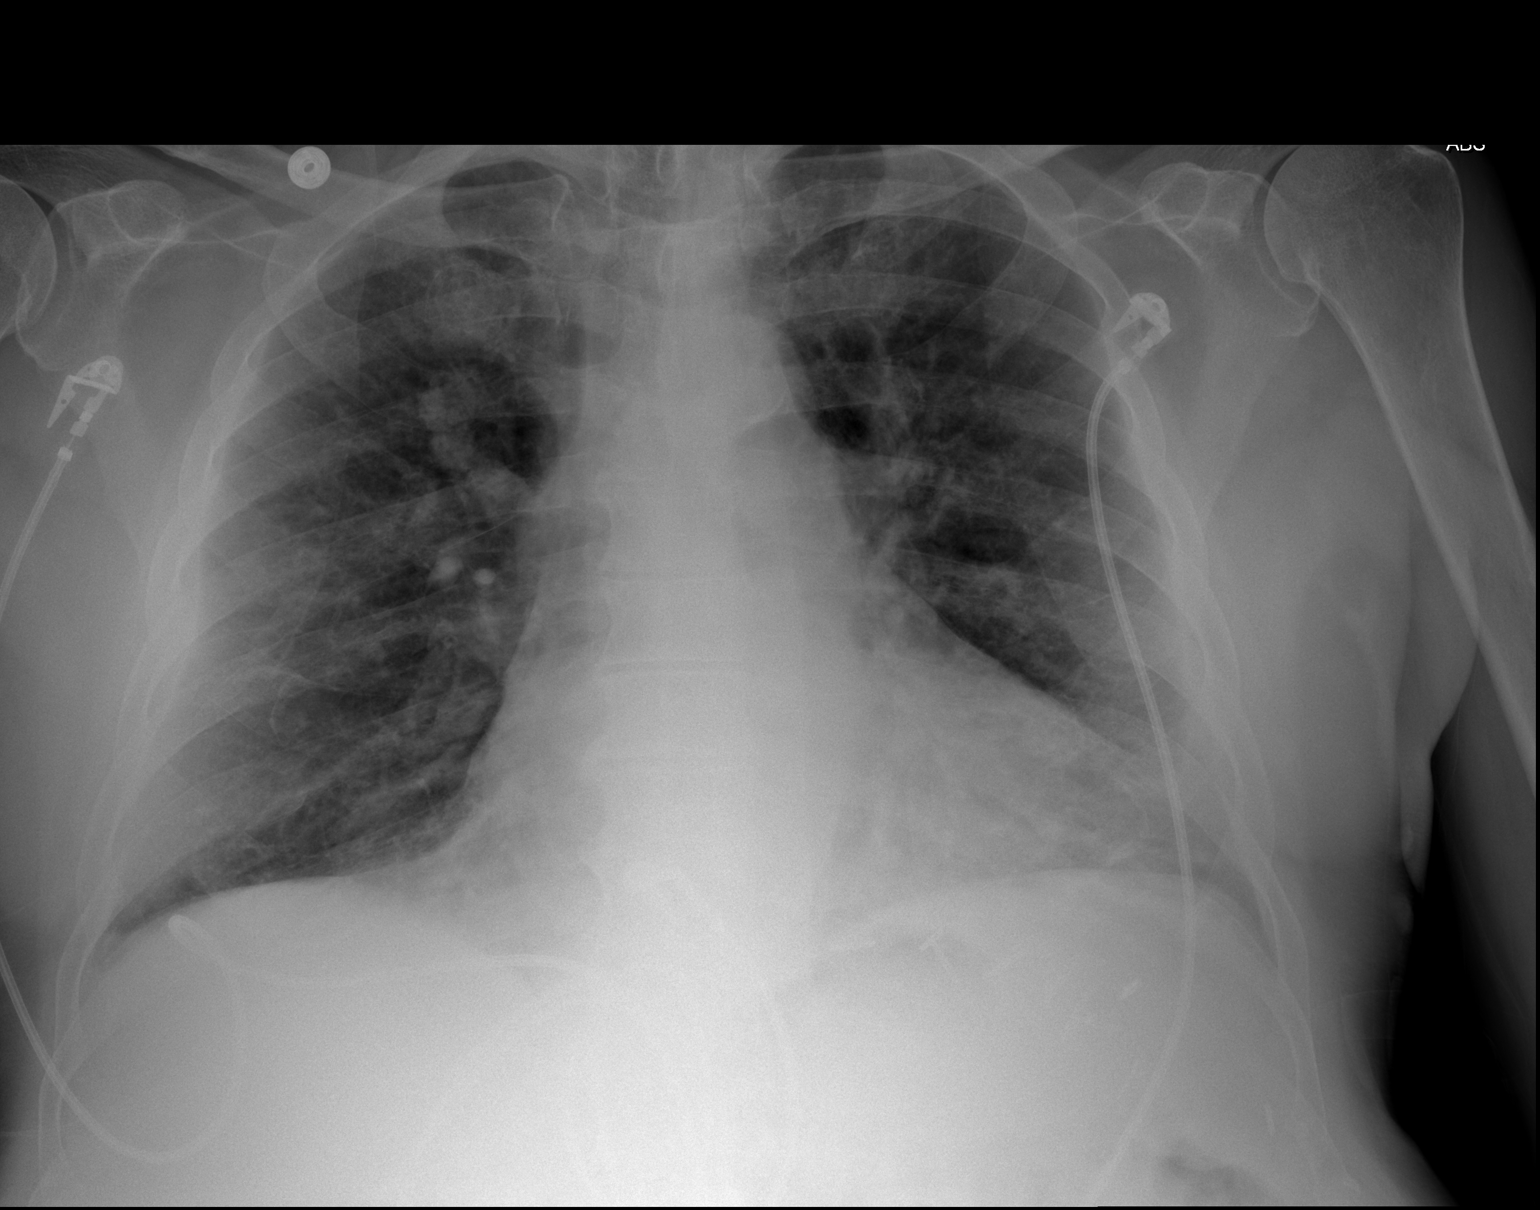
[im 2/2]
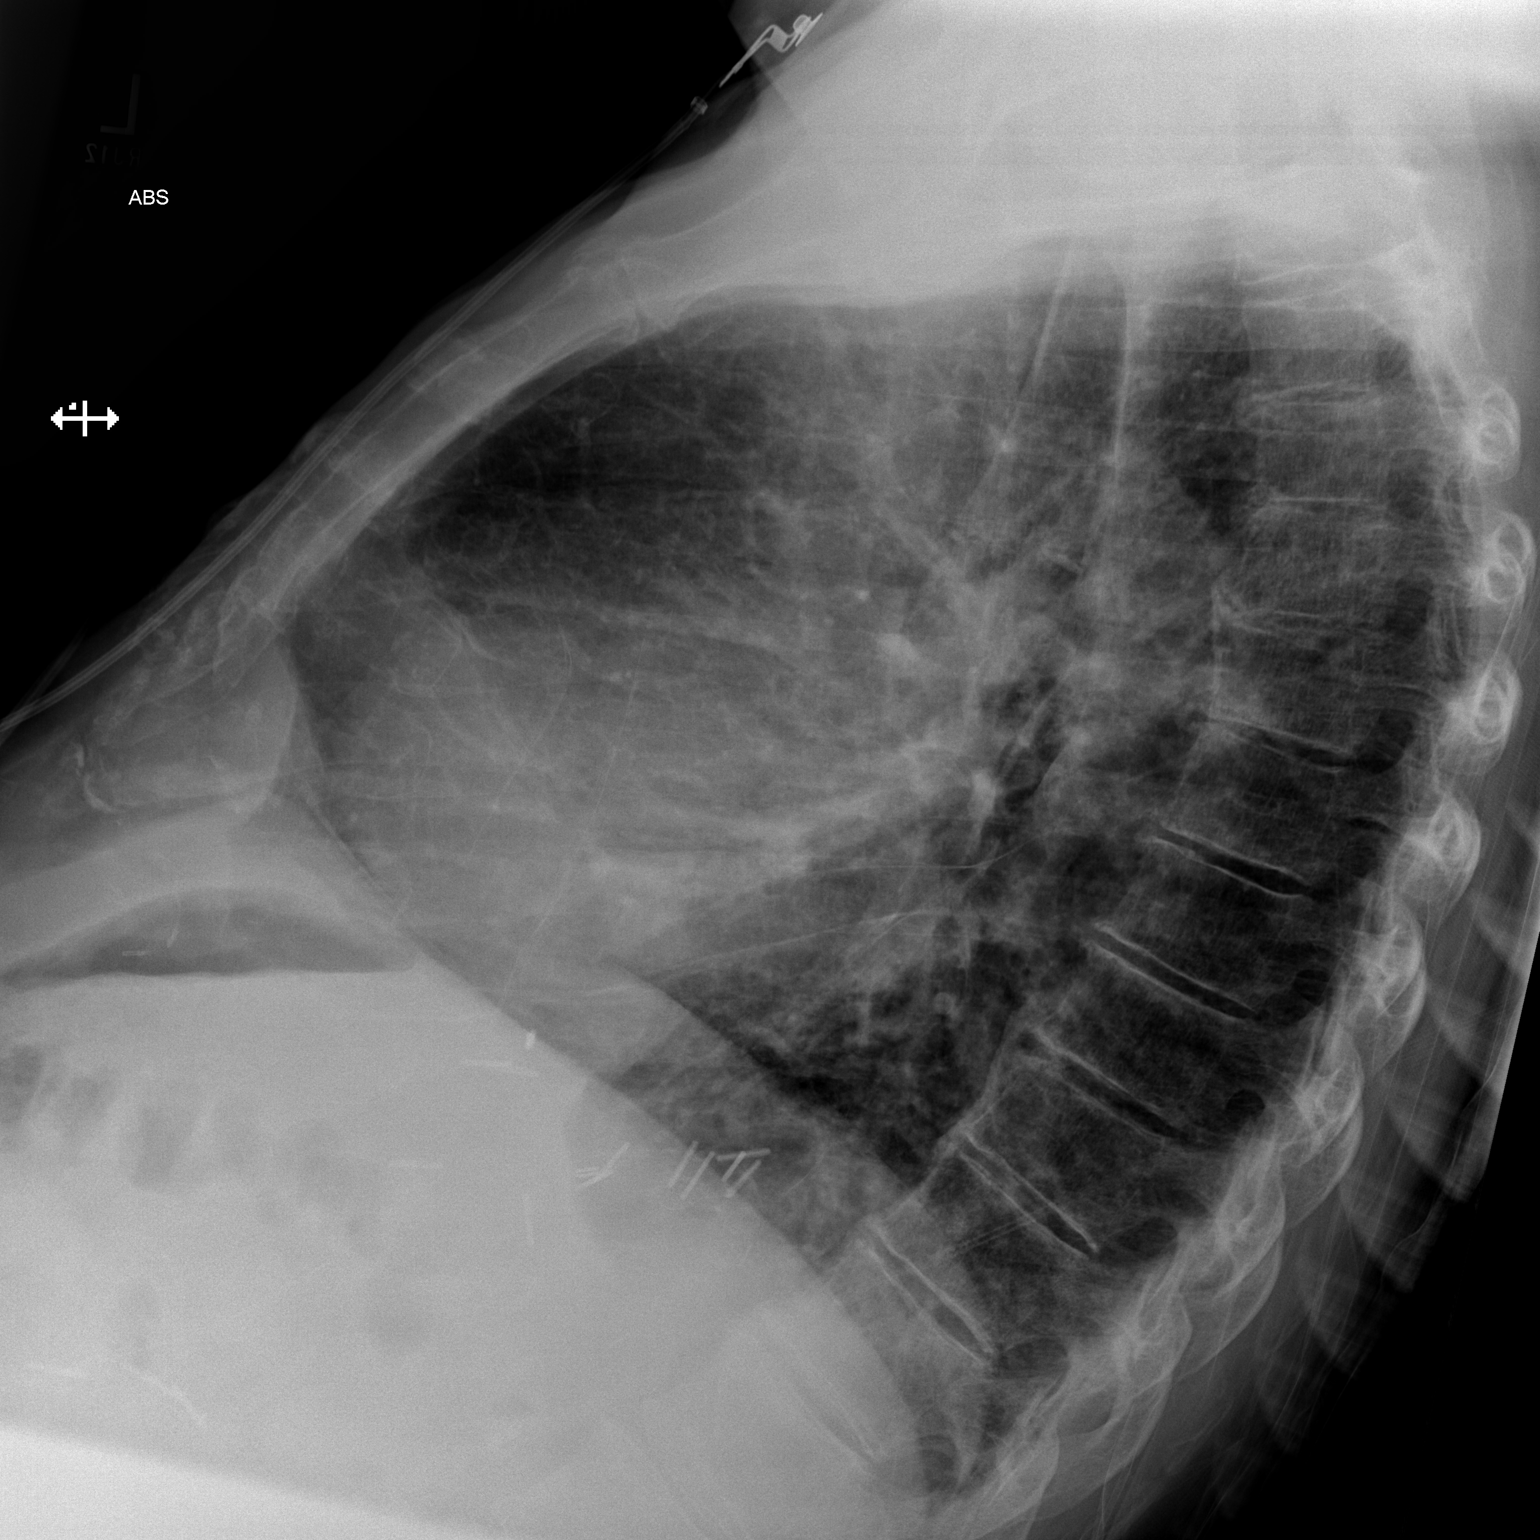

[2 of 2 positions shown; findings below may reference images not displayed]

DG CHEST 1V PORT dated
07/18/2013; DG CHEST 2 VIEW dated 03/15/2013; CT CHEST W/O CM dated
03/14/2011
FINDINGS: Grossly unchanged enlarged cardiac silhouette and mediastinal
contours with atherosclerotic plaque within the thoracic aorta. Lung
volumes remain persistently reduced with grossly unchanged bibasilar
heterogeneous opacities. Grossly unchanged branching tubular
opacities radiating from the right hilum as demonstrated on prior
chest CT. No new focal airspace opacities. The lungs are
hyperexpanded with diffuse thickening of the pulmonary interstitium.
There is minimal pleural parenchymal thickening about the bilateral
major fissures. Mild pulmonary venous congestion without frank
evidence of edema. No pleural effusion or pneumothorax. Unchanged
bones. Stigmata of DISH within the thoracic spine. Surgical clips
overlie the left upper abdomen.
IMPRESSION: 1. Grossly unchanged branching tubular opacities radiating from the
superior aspect of the right hilum as demonstrated on recently
performed chest CT. Again, these branching tubular opacities may
represent the sequela of chronic ABPA infection though slowly
growing endobronchial neoplasm may have a similar appearance.
Further evaluation with bronchoscopy could be performed as
clinically indicated.
2. Lung hyperexpansion and bronchitic change without acute
cardiopulmonary disease.
3. Cardiomegaly and pulmonary venous congestion without frank
evidence of edema.

## 2016-03-26 ENCOUNTER — Ambulatory Visit: Payer: Self-pay | Admitting: Internal Medicine

## 2016-03-26 ENCOUNTER — Ambulatory Visit (INDEPENDENT_AMBULATORY_CARE_PROVIDER_SITE_OTHER): Payer: Medicare Other | Admitting: Internal Medicine

## 2016-03-26 ENCOUNTER — Other Ambulatory Visit: Payer: Self-pay | Admitting: *Deleted

## 2016-03-26 VITALS — BP 112/60 | HR 72 | Temp 97.8°F | Resp 18 | Ht 70.0 in | Wt 197.4 lb

## 2016-03-26 DIAGNOSIS — H9193 Unspecified hearing loss, bilateral: Secondary | ICD-10-CM | POA: Diagnosis not present

## 2016-03-26 DIAGNOSIS — E09311 Drug or chemical induced diabetes mellitus with unspecified diabetic retinopathy with macular edema: Secondary | ICD-10-CM

## 2016-03-26 DIAGNOSIS — Z9181 History of falling: Secondary | ICD-10-CM | POA: Diagnosis not present

## 2016-03-26 DIAGNOSIS — N320 Bladder-neck obstruction: Secondary | ICD-10-CM

## 2016-03-26 DIAGNOSIS — I10 Essential (primary) hypertension: Secondary | ICD-10-CM

## 2016-03-26 DIAGNOSIS — J449 Chronic obstructive pulmonary disease, unspecified: Secondary | ICD-10-CM | POA: Diagnosis not present

## 2016-03-26 DIAGNOSIS — E782 Mixed hyperlipidemia: Secondary | ICD-10-CM | POA: Diagnosis not present

## 2016-03-26 DIAGNOSIS — E1121 Type 2 diabetes mellitus with diabetic nephropathy: Secondary | ICD-10-CM | POA: Diagnosis not present

## 2016-03-26 DIAGNOSIS — Z9081 Acquired absence of spleen: Secondary | ICD-10-CM

## 2016-03-26 DIAGNOSIS — R6889 Other general symptoms and signs: Secondary | ICD-10-CM | POA: Diagnosis not present

## 2016-03-26 DIAGNOSIS — C439 Malignant melanoma of skin, unspecified: Secondary | ICD-10-CM

## 2016-03-26 DIAGNOSIS — I480 Paroxysmal atrial fibrillation: Secondary | ICD-10-CM | POA: Diagnosis not present

## 2016-03-26 DIAGNOSIS — H548 Legal blindness, as defined in USA: Secondary | ICD-10-CM

## 2016-03-26 DIAGNOSIS — E6609 Other obesity due to excess calories: Secondary | ICD-10-CM

## 2016-03-26 DIAGNOSIS — R911 Solitary pulmonary nodule: Secondary | ICD-10-CM

## 2016-03-26 DIAGNOSIS — Z79899 Other long term (current) drug therapy: Secondary | ICD-10-CM

## 2016-03-26 DIAGNOSIS — Z0001 Encounter for general adult medical examination with abnormal findings: Secondary | ICD-10-CM | POA: Diagnosis not present

## 2016-03-26 DIAGNOSIS — E559 Vitamin D deficiency, unspecified: Secondary | ICD-10-CM

## 2016-03-26 DIAGNOSIS — Z0000 Encounter for general adult medical examination without abnormal findings: Secondary | ICD-10-CM

## 2016-03-26 LAB — CBC WITH DIFFERENTIAL/PLATELET
BASOS ABS: 142 {cells}/uL (ref 0–200)
Basophils Relative: 1 %
EOS PCT: 6 %
Eosinophils Absolute: 852 cells/uL — ABNORMAL HIGH (ref 15–500)
HCT: 42.9 % (ref 38.5–50.0)
Hemoglobin: 14.9 g/dL (ref 13.2–17.1)
Lymphocytes Relative: 19 %
Lymphs Abs: 2698 cells/uL (ref 850–3900)
MCH: 29 pg (ref 27.0–33.0)
MCHC: 34.7 g/dL (ref 32.0–36.0)
MCV: 83.6 fL (ref 80.0–100.0)
MONOS PCT: 15 %
MPV: 10.2 fL (ref 7.5–12.5)
Monocytes Absolute: 2130 cells/uL — ABNORMAL HIGH (ref 200–950)
NEUTROS PCT: 59 %
Neutro Abs: 8378 cells/uL — ABNORMAL HIGH (ref 1500–7800)
PLATELETS: 301 10*3/uL (ref 140–400)
RBC: 5.13 MIL/uL (ref 4.20–5.80)
RDW: 14.3 % (ref 11.0–15.0)
WBC: 14.2 10*3/uL — ABNORMAL HIGH (ref 3.8–10.8)

## 2016-03-26 MED ORDER — TRAMADOL HCL 50 MG PO TABS: 100.0000 mg | ORAL_TABLET | Freq: Four times a day (QID) | ORAL | 0 refills | Status: AC | PRN

## 2016-03-26 MED ORDER — TIOTROPIUM BROMIDE MONOHYDRATE 18 MCG IN CAPS: 18.0000 ug | ORAL_CAPSULE | Freq: Every day | RESPIRATORY_TRACT | 4 refills | Status: AC

## 2016-03-26 NOTE — Progress Notes (Signed)
MEDICARE ANNUAL WELLNESS VISIT AND FOLLOW UP Assessment:    1. Essential hypertension -cont meds -well controlled -dash diet -monitor at home --exercise as tolerated - TSH  2. Type 2 diabetes mellitus with diabetic nephropathy, without long-term current use of insulin (HCC) -cont diet and exercise - Hemoglobin A1c  3. Hyperlipidemia -cont meds -cont diet and exercise - Lipid panel  4. Vitamin D deficiency -cont Vit D  5. Medication management  - CBC with Differential/Platelet - BASIC METABOLIC PANEL WITH GFR - Hepatic function panel  6. Paroxysmal atrial fibrillation (HCC) -cont daily asa -cont rate control  7. Severe chronic obstructive pulmonary disease (HCC) -cont meds -very non-compliant with inhalers -may benefit from upcoming triple therapy  8. Legal blindness -macular degeneration with retinopathy -followed by [facility]  9. Bilateral hearing loss, unspecified hearing loss type -refuses hearing aids  10. Melanoma of skin ([facility]) -followed previously by Dermatology  11. BPH/prostatism -cont meds  12. At high risk for falls -secondary to blindness  13. Encounter for Medicare annual wellness exam -due next year  61. History of splenectomy-for hereditary spherocytosis -follow CBC  15. Lung nodule, solitary -cont monitoring CXR  16. Obesity due to excess calories with serious comorbidity, unspecified classification -needs further diet and exercise  17. Pulmonary nodule, right upper lobe, 1cm, new since 2012 -may need further CT lung without contrast   Over 30 minutes of exam, counseling, chart review, and critical decision making was performed  Future Appointments Date Time Provider [place]  07/30/2016 10:30 AM Unk Pinto, MD [facility] None  12/08/2016 10:00 AM Starlyn Skeans, PA-C [facility] None     Plan:   During the course of the visit the patient was educated and counseled about appropriate screening and  preventive services including:    Pneumococcal vaccine   Influenza vaccine  Prevnar 13  Td vaccine  Screening electrocardiogram  Colorectal cancer screening  Diabetes screening  Glaucoma screening  Nutrition counseling    Subjective:  [name] is a 75 y.o. male who presents for Medicare Annual Wellness Visit and 3 month follow up for HTN, hyperlipidemia, diabetes, and vitamin D Def.   His blood pressure has been controlled at home, today their BP is BP: 112/60 He does not workout. He denies chest pain, shortness of breath, dizziness.  He is on cholesterol medication and denies myalgias. His cholesterol is at goal. The cholesterol last visit was:   Lab Results  Component Value Date   CHOL 112 03/26/2016   HDL 29 (L) 03/26/2016   LDLCALC 61 03/26/2016   TRIG 109 03/26/2016   CHOLHDL 3.9 03/26/2016   He reports that he does work around his yard and    Lab Results  Component Value Date   HGBA1C 6.0 (H) 03/26/2016   Last GFR Lab Results  Component Value Date   GFRNONAA 81 03/26/2016     Lab Results  Component Value Date   GFRAA >89 03/26/2016   Patient is on Vitamin D supplement.   Lab Results  Component Value Date   VD25OH 78 12/05/2015     He reports that his breathing has been somewhat short with exertion.  He does use the spiriva at bedtime.  He has not been using either the symbicort or the spiriva.  He is non-compliant with his routine.     Medication Review: Current Outpatient Prescriptions on File Prior to Visit  Medication Sig Dispense Refill  . apixaban (ELIQUIS) 5 MG TABS tablet Take by mouth.    Marland Kitchen  atorvastatin (LIPITOR) 40 MG tablet TAKE 1 TABLET BY MOUTH EVERY DAY FOR CHOLESTEROL 90 tablet 1  . benazepril (LOTENSIN) 40 MG tablet Take 1 tablet (40 mg total) by mouth daily. 90 tablet 1  . budesonide-formoterol (SYMBICORT) 160-4.5 MCG/ACT inhaler Inhale 2 puffs into the lungs 2 (two) times daily. 1 Inhaler 12  . calcium-vitamin D (OSCAL  WITH D) 500-200 MG-UNIT per tablet Take 1 tablet by mouth daily.      . clobetasol ointment (TEMOVATE) 0.05 % Apply to eczema rash 2-3 x/da 60 g 99  . Flaxseed, Linseed, (FLAXSEED OIL) 1000 MG CAPS Take 1,000 mg by mouth daily.    Marland Kitchen glucose blood test strip Test blood sugar 1 times daily or as directed for fluctuating blood sugars. DX E11.21 100 each 6  . guaiFENesin (MUCINEX) 600 MG 12 hr tablet Take 1,200 mg by mouth 2 (two) times daily.      . metFORMIN (GLUCOPHAGE) 500 MG tablet Take 1 - 2 tablets twice daily with food 360 tablet 1  . metoprolol tartrate (LOPRESSOR) 25 MG tablet Take 1 tablet (25 mg total) by mouth 2 (two) times daily. 180 tablet 1  . Omega-3 Fatty Acids (FISH OIL) 1000 MG CAPS Take by mouth daily.     No current facility-administered medications on file prior to visit.     Allergies: Allergies  Allergen Reactions  . Crestor [Rosuvastatin]   . Latex Itching  . Neomycin-Bacitracin Zn-Polymyx Other (See Comments)    Causes blisters  . Ciprofloxacin Rash    Current Problems (verified) has T2_NIDDM w/ CKD; Hyperlipidemia; Obesity; BPH/prostatism; Melanoma of skin ([place]); History of splenectomy-for hereditary spherocytosis; Essential hypertension; Vitamin D deficiency; Medication management; Pulmonary nodule, right upper lobe, 1cm, new since 2012; Legal blindness -macular degeneration with retinopathy; Encounter for Medicare annual wellness exam; Hearing loss; Atrial fibrillation ([place]); At high risk for falls; Lung nodule, solitary; and Severe chronic obstructive pulmonary disease ([place]) on his problem list.  Screening Tests Immunization History  Administered Date(s) Administered  . Pneumococcal Conjugate-13 11/13/2014  . Pneumococcal-Unspecified 04/27/2001  . Td 04/27/2002    Preventative care: Last colonoscopy: 2010  Prior vaccinations: TD or Tdap: 2004  Influenza: Declined  Pneumococcal: 2003, declined Prevnar13: 2916 Shingles/Zostavax: Declined  Names of  Other Physician/Practitioners you currently use: 1. [facility] here for primary care 2. Se[facility] a doctor at [facility] , eye doctor, last visit 2017 3. Does not see one currently, dentist Patient Care Team: Unk Pinto, MD as PCP - General (Internal Medicine) Minus Breeding, MD as Consulting Physician (Cardiology) Gatha Mayer, MD as Consulting Physician (Gastroenterology) Carolan Clines, MD as Consulting Physician (Urology) Crista Luria, MD as Consulting Physician (Dermatology) Brand Males, MD as Consulting Physician (Pulmonary Disease)  Surgical: He  has a past surgical history that includes Bladder repair; Cholecystectomy; and Splenectomy, total. Family His family history includes Heart disease in his father and mother. Social history  He reports that he quit smoking about 21 years ago. His smoking use included Cigarettes. He has a 135.00 pack-year smoking history. He does not have any smokeless tobacco history on file. He reports that he does not drink alcohol or use drugs.  MEDICARE WELLNESS OBJECTIVES: Physical activity: Current Exercise Habits: The patient does not participate in regular exercise at present, Exercise limited by: neurologic condition(s);Other - see comments (blindness) Cardiac risk factors: Cardiac Risk Factors include: advanced age (>53men, >84 women);diabetes mellitus;dyslipidemia;hypertension;male gender;obesity (BMI >30kg/m2);sedentary lifestyle Depression/mood screen:   Depression screen [facility] 2/9 03/26/2016  Decreased Interest 0  Down, Depressed, Hopeless 0  PHQ - 2 Score 0    ADLs:  In your present state of health, do you have any difficulty performing the following activities: 03/26/2016 05/29/2015  Hearing? Tempie Donning  Vision? Y Y  Difficulty concentrating or making decisions? Y N  Walking or climbing stairs? Y -  Dressing or bathing? Y N  Doing errands, shopping? N Y  Conservation officer, nature and eating ? N -  Using the  Toilet? N -  In the past six months, have you accidently leaked urine? Y -  Do you have problems with loss of bowel control? N -  Managing your Medications? N -  Managing your Finances? N -  Housekeeping or managing your Housekeeping? N -  Some recent data might be hidden     Cognitive Testing  Alert? Yes  Normal Appearance?Yes  Oriented to person? Yes  Place? Yes   Time? Yes  Recall of three objects?  Yes  Can perform simple calculations? Yes  Displays appropriate judgment?Yes  Can read the correct time from a watch face?Yes  EOL planning: Does Patient Have a Medical Advance Directive?: Yes Type of Advance Directive: Living will Does patient want to make changes to medical advance directive?: Yes (MAU/Ambulatory/Procedural Areas - Information given)   Objective:   Today's Vitals   03/26/16 1459  BP: 112/60  Pulse: 72  Resp: 18  Temp: 97.8 F (36.6 C)  Weight: 197 lb 6.4 oz (89.5 kg)  Height: 5\' 10"  (1.778 m)   Body mass index is 28.32 kg/m.  General appearance: alert, no distress, WD/WN, male HEENT: normocephalic, sclerae anicteric, TMs pearly, nares patent, no discharge or erythema, pharynx normal Oral cavity: MMM, no lesions Neck: supple, no lymphadenopathy, no thyromegaly, no masses Heart: RRR, normal S1, S2, no murmurs Lungs: CTA bilaterally, no wheezes, rhonchi, or rales Abdomen: +bs, soft, non tender, non distended, no masses, no hepatomegaly, no splenomegaly Musculoskeletal: nontender, no swelling, no obvious deformity Extremities: no edema, no cyanosis, no clubbing Pulses: 2+ symmetric, upper and lower extremities, normal cap refill Neurological: alert, oriented x 3, CN2-12 intact, strength normal upper extremities and lower extremities, sensation normal throughout, DTRs 2+ throughout, no cerebellar signs, gait normal Psychiatric: normal affect, behavior normal, pleasant   Medicare Attestation I have personally reviewed: The patient's medical and social  history Their use of alcohol, tobacco or illicit drugs Their current medications and supplements The patient's functional ability including ADLs,fall risks, home safety risks, cognitive, and hearing and visual impairment Diet and physical activities Evidence for depression or mood disorders  The patient's weight, height, BMI, and visual acuity have been recorded in the chart.  I have made referrals, counseling, and provided education to the patient based on review of the above and I have provided the patient with a written personalized care plan for preventive services.     Starlyn Skeans, PA-C   03/29/2016

## 2016-03-27 LAB — LIPID PANEL
CHOL/HDL RATIO: 3.9 ratio (ref <5.0)
Cholesterol: 112 mg/dL (ref <200)
HDL: 29 mg/dL — AB (ref >40)
LDL CALC: 61 mg/dL (ref <100)
Triglycerides: 109 mg/dL (ref <150)
VLDL: 22 mg/dL (ref <30)

## 2016-03-27 LAB — TSH: TSH: 1.45 mIU/L (ref 0.40–4.50)

## 2016-03-27 LAB — HEMOGLOBIN A1C
HEMOGLOBIN A1C: 6 % — AB (ref <5.7)
MEAN PLASMA GLUCOSE: 126 mg/dL

## 2016-03-27 LAB — BASIC METABOLIC PANEL WITH GFR
BUN: 19 mg/dL (ref 7–25)
CHLORIDE: 101 mmol/L (ref 98–110)
CO2: 27 mmol/L (ref 20–31)
Calcium: 9.5 mg/dL (ref 8.6–10.3)
Creat: 0.91 mg/dL (ref 0.70–1.18)
GFR, EST NON AFRICAN AMERICAN: 81 mL/min (ref >=60)
GLUCOSE: 83 mg/dL (ref 65–99)
POTASSIUM: 5 mmol/L (ref 3.5–5.3)
SODIUM: 138 mmol/L (ref 135–146)

## 2016-03-27 LAB — HEPATIC FUNCTION PANEL
ALBUMIN: 4 g/dL (ref 3.6–5.1)
ALK PHOS: 53 U/L (ref 40–115)
ALT: 16 U/L (ref 9–46)
AST: 14 U/L (ref 10–35)
BILIRUBIN INDIRECT: 0.6 mg/dL (ref 0.2–1.2)
Bilirubin, Direct: 0.2 mg/dL (ref <=0.2)
TOTAL PROTEIN: 6.7 g/dL (ref 6.1–8.1)
Total Bilirubin: 0.8 mg/dL (ref 0.2–1.2)

## 2016-07-02 ENCOUNTER — Other Ambulatory Visit: Payer: Self-pay | Admitting: Internal Medicine

## 2016-07-29 ENCOUNTER — Ambulatory Visit: Payer: Self-pay | Admitting: Internal Medicine

## 2016-07-30 ENCOUNTER — Encounter: Payer: Self-pay | Admitting: Internal Medicine

## 2016-07-30 ENCOUNTER — Ambulatory Visit (INDEPENDENT_AMBULATORY_CARE_PROVIDER_SITE_OTHER): Payer: Medicare Other | Admitting: Internal Medicine

## 2016-07-30 VITALS — BP 116/62 | HR 76 | Temp 97.8°F | Resp 16 | Ht 70.0 in | Wt 184.8 lb

## 2016-07-30 DIAGNOSIS — E1122 Type 2 diabetes mellitus with diabetic chronic kidney disease: Secondary | ICD-10-CM | POA: Diagnosis not present

## 2016-07-30 DIAGNOSIS — E559 Vitamin D deficiency, unspecified: Secondary | ICD-10-CM

## 2016-07-30 DIAGNOSIS — Z79899 Other long term (current) drug therapy: Secondary | ICD-10-CM

## 2016-07-30 DIAGNOSIS — E782 Mixed hyperlipidemia: Secondary | ICD-10-CM

## 2016-07-30 DIAGNOSIS — H548 Legal blindness, as defined in USA: Secondary | ICD-10-CM

## 2016-07-30 DIAGNOSIS — E09311 Drug or chemical induced diabetes mellitus with unspecified diabetic retinopathy with macular edema: Secondary | ICD-10-CM

## 2016-07-30 DIAGNOSIS — I10 Essential (primary) hypertension: Secondary | ICD-10-CM | POA: Diagnosis not present

## 2016-07-30 DIAGNOSIS — I480 Paroxysmal atrial fibrillation: Secondary | ICD-10-CM

## 2016-07-30 DIAGNOSIS — J449 Chronic obstructive pulmonary disease, unspecified: Secondary | ICD-10-CM

## 2016-07-30 DIAGNOSIS — N182 Chronic kidney disease, stage 2 (mild): Secondary | ICD-10-CM | POA: Diagnosis not present

## 2016-07-30 LAB — CBC WITH DIFFERENTIAL/PLATELET
BASOS ABS: 150 {cells}/uL (ref 0–200)
Basophils Relative: 1 %
EOS ABS: 1200 {cells}/uL — AB (ref 15–500)
Eosinophils Relative: 8 %
HCT: 41.5 % (ref 38.5–50.0)
HEMOGLOBIN: 14.4 g/dL (ref 13.2–17.1)
LYMPHS ABS: 2550 {cells}/uL (ref 850–3900)
Lymphocytes Relative: 17 %
MCH: 29.3 pg (ref 27.0–33.0)
MCHC: 34.7 g/dL (ref 32.0–36.0)
MCV: 84.5 fL (ref 80.0–100.0)
MPV: 10.7 fL (ref 7.5–12.5)
Monocytes Absolute: 2250 cells/uL — ABNORMAL HIGH (ref 200–950)
Monocytes Relative: 15 %
NEUTROS ABS: 8850 {cells}/uL — AB (ref 1500–7800)
Neutrophils Relative %: 59 %
Platelets: 324 10*3/uL (ref 140–400)
RBC: 4.91 MIL/uL (ref 4.20–5.80)
RDW: 14 % (ref 11.0–15.0)
WBC: 15 10*3/uL — ABNORMAL HIGH (ref 3.8–10.8)

## 2016-07-30 LAB — TSH: TSH: 1.53 mIU/L (ref 0.40–4.50)

## 2016-07-30 LAB — HEPATIC FUNCTION PANEL
ALT: 13 U/L (ref 9–46)
AST: 12 U/L (ref 10–35)
Albumin: 4 g/dL (ref 3.6–5.1)
Alkaline Phosphatase: 62 U/L (ref 40–115)
BILIRUBIN DIRECT: 0.2 mg/dL (ref <=0.2)
BILIRUBIN INDIRECT: 0.6 mg/dL (ref 0.2–1.2)
Total Bilirubin: 0.8 mg/dL (ref 0.2–1.2)
Total Protein: 6.3 g/dL (ref 6.1–8.1)

## 2016-07-30 LAB — BASIC METABOLIC PANEL WITH GFR
BUN: 14 mg/dL (ref 7–25)
CALCIUM: 9.4 mg/dL (ref 8.6–10.3)
CO2: 24 mmol/L (ref 20–31)
Chloride: 102 mmol/L (ref 98–110)
Creat: 0.89 mg/dL (ref 0.70–1.18)
GFR, EST NON AFRICAN AMERICAN: 82 mL/min (ref >=60)
Glucose, Bld: 88 mg/dL (ref 65–99)
Potassium: 4.5 mmol/L (ref 3.5–5.3)
SODIUM: 139 mmol/L (ref 135–146)

## 2016-07-30 LAB — LIPID PANEL
CHOL/HDL RATIO: 3.7 ratio (ref <5.0)
CHOLESTEROL: 106 mg/dL (ref <200)
HDL: 29 mg/dL — ABNORMAL LOW (ref >40)
LDL Cholesterol: 59 mg/dL (ref <100)
Triglycerides: 91 mg/dL (ref <150)
VLDL: 18 mg/dL (ref <30)

## 2016-07-30 NOTE — Patient Instructions (Signed)

## 2016-07-30 NOTE — Progress Notes (Signed)
This very nice 76 y.o. DWM presents for 3 month follow up with Hypertension, Hyperlipidemia, Pre-Diabetes and Vitamin D Deficiency. Other problems include  legal blindness from AMD and he is also hearing impaired with bilat hearing aids. Also, he has  COPD having quit tob in  2002. He has a RUL lung nodule followed by pulmonologist, Dr Raul Del in [place].      Patient is treated for HTN (2009)  & BP has been controlled at home.  In fall 2016 he was started on Eliquis for pAfib by Dr Clayborn Bigness, Cardiologist in [place].  # weeks ago patient had an episode of weakness and since has sporadically taken his Benazepril (according to his daughter). Today's BP is at goal - 116/62. Patient has had no complaints of any cardiac type chest pain, palpitations, dyspnea/orthopnea/PND, dizziness, claudication, or dependent edema.     Hyperlipidemia is controlled with diet, meds & supplements. Patient denies myalgias or other med SE's. Last Lipids were at goal: Lab Results  Component Value Date   CHOL 112 03/26/2016   HDL 29 (L) 03/26/2016   LDLCALC 61 03/26/2016   TRIG 109 03/26/2016   CHOLHDL 3.9 03/26/2016      Also, the patient has history of T2_NIDDM (A1c 7.4% in 2006) and w/ CKD2  and has had no symptoms of reactive hypoglycemia, diabetic polys, paresthesias or visual blurring.  Last A1c was  Lab Results  Component Value Date   HGBA1C 6.0 (H) 03/26/2016      Further, the patient also has history of Vitamin D Deficiency ("35" in 2014) and supplements vitamin D without any suspected side-effects. Last vitamin D was   Lab Results  Component Value Date   VD25OH 78 12/05/2015   Current Outpatient Prescriptions on File Prior to Visit  Medication Sig  . apixaban (ELIQUIS) 5 MG  Take by mouth.  Marland Kitchen atorvastatin  40 MG tablet TAKE ONE TABONCE DAILY   . SYMBICORT 160-4.5 inhaler 2 puffs  2 xdaily.  Darron Doom w/ D 500-200 t Take 1 tablet by mouth daily.    Marland Kitchen VITAMIN D  Take 5,000 Units by mouth daily.    . clobetasol oint (TEMOVATE)  Apply to eczema rash 2-3 x/da  . FLAXSEED OIL 1000 MG  Take 1,000 mg by mouth daily.  Marland Kitchen guaiFENesin  600 MG 12 hr tablet Take 1,200 mg by mouth 2 (two) times daily.    . metFORMIN  500 MG tablet Take 1 - 2 tablets twice daily with food  . metoprolol tart 25 MG tablet Take 1 tab 2  x daily.  . Omega-3 FISH OIL 1000 MG  Take by mouth daily.  Marland Kitchen tiotropium  inhal cap Place 1 cap into inhaler  daily.  . traMADol 50 MG tablet Take 2 tab every 6  hrs as needed.  . Benazepril  40 MG tablet Patient not taking: Reported on 07/30/2016   Allergies  Allergen Reactions  . Crestor [Rosuvastatin]   . Latex Itching  . Neomycin-Bacitracin Zn-Polymyx Other (See Comments)    Causes blisters  . Ciprofloxacin Rash   PMHx:   Past Medical History:  Diagnosis Date  . COPD (chronic obstructive pulmonary disease) ([place])   . Diabetes mellitus   . Emphysema   . Hypercholesteremia   . Hypertension    Immunization History  Administered Date(s) Administered  . Pneumococcal Conjugate-13 11/13/2014  . Pneumococcal-Unspecified 04/27/2001  . Td 04/27/2002   Past Surgical History:  Procedure Laterality Date  . BLADDER  REPAIR    . CHOLECYSTECTOMY    . SPLENECTOMY, TOTAL     FHx:    Reviewed / unchanged  SHx:    Reviewed / unchanged  Systems Review:  Constitutional: Denies fever, chills, wt changes, headaches, insomnia, fatigue, night sweats, change in appetite. Eyes: Denies redness, blurred vision, diplopia, discharge, itchy, watery eyes.  ENT: Denies discharge, congestion, post nasal drip, epistaxis, sore throat, earache, hearing loss, dental pain, tinnitus, vertigo, sinus pain, snoring.  CV: Denies chest pain, palpitations, irregular heartbeat, syncope, dyspnea, diaphoresis, orthopnea, PND, claudication or edema. Respiratory: denies cough, dyspnea, DOE, pleurisy, hoarseness, laryngitis, wheezing.  Gastrointestinal: Denies dysphagia, odynophagia, heartburn, reflux, water  brash, abdominal pain or cramps, nausea, vomiting, bloating, diarrhea, constipation, hematemesis, melena, hematochezia  or hemorrhoids. Genitourinary: Denies dysuria, frequency, urgency, nocturia, hesitancy, discharge, hematuria or flank pain. Musculoskeletal: Denies arthralgias, myalgias, stiffness, jt. swelling, pain, limping or strain/sprain.  Skin: Denies pruritus, rash, hives, warts, acne, eczema or change in skin lesion(s). Neuro: No weakness, tremor, incoordination, spasms, paresthesia or pain. Psychiatric: Denies confusion, memory loss or sensory loss. Endo: Denies change in weight, skin or hair change.  Heme/Lymph: No excessive bleeding, bruising or enlarged lymph nodes.  Physical Exam  BP 116/62   Pulse 76   Temp 97.8 F (36.6 C)   Resp 16   Ht 5\' 10"  (1.778 m)   Wt 184 lb 12.8 oz (83.8 kg)   BMI 26.52 kg/m   Appears well nourished, well groomed  and in no distress.  Eyes: VA decreased to hand motion & finger counting. PERRLA, EOMs, conjunctiva no swelling or erythema.  Sinuses: No frontal/maxillary tenderness ENT/Mouth: EAC's clear, TM's nl w/o erythema, bulging. Nares clear w/o erythema, swelling, exudates. Oropharynx clear without erythema or exudates. Oral hygiene is good. Tongue normal, non obstructing. Hearing intact.  Neck: Supple. Thyroid nl. Car 2+/2+ without bruits, nodes or JVD. Chest: Respirations nl with BS clear & equal w/o rales, rhonchi, wheezing or stridor.  Cor: Heart sounds normal w/ regular rate and rhythm without sig. murmurs, gallops, clicks or rubs. Peripheral pulses normal and equal  without edema.  Abdomen: Soft & bowel sounds normal. Non-tender w/o guarding, rebound, hernias, masses or organomegaly.  Lymphatics: Unremarkable.  Musculoskeletal: Full ROM all peripheral extremities, joint stability, 5/5 strength and normal gait.  Skin: Warm, dry without exposed rashes, lesions or ecchymosis apparent.  Neuro: Cranial nerves intact, reflexes equal  bilaterally. Sensory-motor testing grossly intact. Tendon reflexes grossly intact.  Pysch: Alert & oriented x 3.  Insight and judgement nl & appropriate. No ideations.  Assessment and Plan:  1. Essential hypertension  - Continue medication, monitor blood pressure at home.  - Continue DASH diet. Reminder to go to the ER if any CP,  SOB, nausea, dizziness, severe HA, changes vision/speech,  left arm numbness and tingling and jaw pain.  - CBC with Differential/Platelet - BASIC METABOLIC PANEL WITH GFR - Magnesium - TSH  2. Mixed hyperlipidemia  - Continue diet/meds, exercise,& lifestyle modifications.  - Continue monitor periodic cholesterol/liver & renal functions  - Hepatic function panel - Lipid panel - TSH  3. Type 2 diabetes mellitus with stage 2 chronic kidney disease, without long-term current use of insulin (HCC)  Continue diet, exercise, lifestyle modifications.  - Monitor appropriate labs.  - Hemoglobin A1c - Insulin, random  4. Vitamin D deficiency  - Continue supplementation.  - VITAMIN D 25 Hydroxy   5. Paroxysmal atrial fibrillation (HCC)  - TSH  6. Severe chronic obstructive pulmonary disease ([place])  7. Legal blindness -macular degeneration with retinopathy   8. Medication management  - VITAMIN D 25 Hydroxy         Discussed  regular exercise, BP monitoring, weight control to achieve/maintain BMI less than 25 and discussed med and SE's. Recommended labs to assess and monitor clinical status with further disposition pending results of labs. Over 30 minutes of exam, counseling, chart review was performed.

## 2016-07-31 ENCOUNTER — Other Ambulatory Visit: Payer: Self-pay | Admitting: Internal Medicine

## 2016-07-31 ENCOUNTER — Encounter: Payer: Self-pay | Admitting: *Deleted

## 2016-07-31 DIAGNOSIS — D729 Disorder of white blood cells, unspecified: Secondary | ICD-10-CM

## 2016-07-31 LAB — INSULIN, RANDOM: Insulin: 8.3 u[IU]/mL (ref 2.0–19.6)

## 2016-07-31 LAB — MAGNESIUM: Magnesium: 1.3 mg/dL — ABNORMAL LOW (ref 1.5–2.5)

## 2016-07-31 LAB — VITAMIN D 25 HYDROXY (VIT D DEFICIENCY, FRACTURES): Vit D, 25-Hydroxy: 72 ng/mL (ref 30–100)

## 2016-07-31 LAB — HEMOGLOBIN A1C
Hgb A1c MFr Bld: 5.5 % (ref <5.7)
Mean Plasma Glucose: 111 mg/dL

## 2016-08-25 ENCOUNTER — Other Ambulatory Visit: Payer: Self-pay | Admitting: Internal Medicine

## 2016-08-25 DIAGNOSIS — E119 Type 2 diabetes mellitus without complications: Secondary | ICD-10-CM

## 2016-09-10 ENCOUNTER — Ambulatory Visit (INDEPENDENT_AMBULATORY_CARE_PROVIDER_SITE_OTHER): Payer: Medicare Other | Admitting: Internal Medicine

## 2016-09-10 ENCOUNTER — Encounter: Payer: Self-pay | Admitting: Internal Medicine

## 2016-09-10 VITALS — BP 120/72 | HR 72 | Temp 97.3°F | Resp 16 | Ht 70.0 in | Wt 186.4 lb

## 2016-09-10 DIAGNOSIS — I480 Paroxysmal atrial fibrillation: Secondary | ICD-10-CM | POA: Diagnosis not present

## 2016-09-10 DIAGNOSIS — J449 Chronic obstructive pulmonary disease, unspecified: Secondary | ICD-10-CM | POA: Diagnosis not present

## 2016-09-10 DIAGNOSIS — Z79899 Other long term (current) drug therapy: Secondary | ICD-10-CM

## 2016-09-10 DIAGNOSIS — E119 Type 2 diabetes mellitus without complications: Secondary | ICD-10-CM | POA: Diagnosis not present

## 2016-09-10 DIAGNOSIS — I10 Essential (primary) hypertension: Secondary | ICD-10-CM | POA: Diagnosis not present

## 2016-09-10 MED ORDER — ATENOLOL 50 MG PO TABS: ORAL_TABLET | ORAL | 3 refills | Status: AC

## 2016-09-10 MED ORDER — METFORMIN HCL 500 MG PO TABS
ORAL_TABLET | ORAL | 3 refills | Status: DC
Start: 2016-09-10 — End: 2016-09-10

## 2016-09-10 MED ORDER — METFORMIN HCL ER 500 MG PO TB24: ORAL_TABLET | ORAL | 3 refills | Status: AC

## 2016-09-10 NOTE — Progress Notes (Signed)
  Subjective:    Patient ID: [name], male    DOB: [birth year], 76 y.o.   MRN: [id number]  HPI  This nice 76 yo DWM with HTN, pAfib,  HLD, COPD, T2_DM/CKD2  &  RUL lung nodule is seen in f/u of low BP and is doing well off of Benazepril and only taking low dose Lopressor 25 mg x 1/2 bid. Patient denies  chest pain, palpitations, dyspnea, dizziness or dependent edema. He also has Legal Blindness from AMD and severe hearing loss & is brought in by his daughter.   Medication Sig  . Apixaban 5 MG TABS tablet Take by mouth.  Marland Kitchen atorvastatin  40 MG tablet TAKE ONE TABLET BY MOUTH ONCE DAILY FOR  CHOLESTEROL  . SYMBICORT 160-4.5  inhaler Inhale 2 puffs into the lungs 2 (two) times daily.  . OSCAL+D 500-200 MG-UNIT  Take 1 tablet by mouth daily.    . (VITAMIN D  Take 5,000 Units by mouth daily.  . TEMOVATE oint 0.05 % Apply to eczema rash 2-3 x/da  . FLAXSEED OIL 1000 MG  Take 1,000 mg by mouth daily.  Marland Kitchen guaiFENesin  600 MG 12 hr t Take 1,200 mg by mouth 2 (two) times daily.    . Omega-3 FISH OIL 1000 MG  Take by mouth daily.  Marland Kitchen SPIRIVA inhal  caps Place 1 capsule (18 mcg total) into inhaler and inhale daily.  . traMADol  50 MG tablet Take 2 tablets (100 mg total) by mouth every 6 (six) hours as needed.  . metFORMIN  500 MG tablet Take 1 - 2 tablets twice daily with food  . metoprolol tart 25 MG tablet Take 1 tab 2 x  daily.  . metFORMIN  500 MG tablet TAKE ONE TO TWO TABLETS BY MOUTH TWICE DAILY WITH FOOD   Allergies  Allergen Reactions  . Crestor [Rosuvastatin]   . Latex Itching  . Neomycin-Bacitracin Zn-Polymyx Other (See Comments)    Causes blisters  . Ciprofloxacin Rash   Past Medical History:  Diagnosis Date  . COPD (chronic obstructive pulmonary disease) ([place])   . Diabetes mellitus   . Emphysema   . Hypercholesteremia   . Hypertension    Review of Systems  10 point systems review negative except as above.    Objective:   Physical Exam   BP 120/72   Pulse 72   Temp 97.3  F (36.3 C)   Resp 16   Ht 5\' 10"  (1.778 m)   Wt 186 lb 6.4 oz (84.6 kg)   BMI 26.75 kg/m   HEENT - WNL. Neck - supple.  Chest - Clear equal BS. Cor - Nl HS. RRR w/o sig MGR. PP 1(+). No edema. MS- FROM w/o deformities.  Gait Nl. Neuro -  Nl w/o focal abnormalities.    Assessment & Plan:   1. Essential hypertension  - d/c metoprolol  and switch to Atenolol for for once daily convenience  - Rx - atenolol  50 MG tablet; Take 1 tablet daily for BP  Disp: 90 tablet; Refill: 3  2. Paroxysmal atrial fibrillation (HCC)   3. Diabetes mellitus without complication ([place])  - Refill Rx - metFORMIN (GLUCOPHAGE) 500 MG tablet; Take  2 tablets twice daily with food for Diabetes  Dispense: 360 tablet; Refill: 3  4. Severe chronic obstructive pulmonary disease ([place])   5. Medication management

## 2016-09-10 NOTE — Patient Instructions (Signed)

## 2016-10-11 ENCOUNTER — Other Ambulatory Visit: Payer: Self-pay | Admitting: Internal Medicine

## 2016-10-12 ENCOUNTER — Other Ambulatory Visit: Payer: Self-pay

## 2016-10-12 MED ORDER — GLUCOSE BLOOD VI STRP: ORAL_STRIP | 6 refills | Status: DC

## 2016-11-05 ENCOUNTER — Telehealth: Payer: Self-pay | Admitting: Internal Medicine

## 2016-11-05 NOTE — Telephone Encounter (Signed)
Patient's daughter called to make an appointment for the patient however, Dr. Juleen China is booked today for new patient appointments. I offered other providers here at [facility] for today however, she declined and stated she would take him to an urgent care. She did not specify why he needed to go to an urgent care before call ended. No further action required for [place].

## 2016-12-08 ENCOUNTER — Encounter: Payer: Self-pay | Admitting: Internal Medicine

## 2016-12-31 ENCOUNTER — Ambulatory Visit (INDEPENDENT_AMBULATORY_CARE_PROVIDER_SITE_OTHER): Payer: Medicare Other | Admitting: Internal Medicine

## 2016-12-31 VITALS — BP 116/66 | HR 72 | Temp 97.3°F | Resp 18 | Ht 70.0 in | Wt 190.8 lb

## 2016-12-31 DIAGNOSIS — N182 Chronic kidney disease, stage 2 (mild): Secondary | ICD-10-CM

## 2016-12-31 DIAGNOSIS — E09311 Drug or chemical induced diabetes mellitus with unspecified diabetic retinopathy with macular edema: Secondary | ICD-10-CM | POA: Diagnosis not present

## 2016-12-31 DIAGNOSIS — E1122 Type 2 diabetes mellitus with diabetic chronic kidney disease: Secondary | ICD-10-CM | POA: Diagnosis not present

## 2016-12-31 DIAGNOSIS — E782 Mixed hyperlipidemia: Secondary | ICD-10-CM | POA: Diagnosis not present

## 2016-12-31 DIAGNOSIS — E559 Vitamin D deficiency, unspecified: Secondary | ICD-10-CM | POA: Diagnosis not present

## 2016-12-31 DIAGNOSIS — I10 Essential (primary) hypertension: Secondary | ICD-10-CM | POA: Diagnosis not present

## 2016-12-31 DIAGNOSIS — Z79899 Other long term (current) drug therapy: Secondary | ICD-10-CM

## 2016-12-31 DIAGNOSIS — L309 Dermatitis, unspecified: Secondary | ICD-10-CM

## 2016-12-31 DIAGNOSIS — H548 Legal blindness, as defined in USA: Secondary | ICD-10-CM

## 2016-12-31 MED ORDER — CLOBETASOL PROPIONATE 0.05 % EX OINT: TOPICAL_OINTMENT | CUTANEOUS | 99 refills | Status: AC

## 2016-12-31 NOTE — Progress Notes (Signed)
This very nice 76 y.o. presents for 3 month follow up with Hypertension, pAfib,  Hyperlipidemia, COP{D, T2_NIDDM and Vitamin D Deficiency. Patient also has hx/o a 1 cm  RUL nodule evaluated in Dec 2014 by Dr Chase Caller and patient admantly declined any evaluations. 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  Patient was dx'd w/Afib kin 2009 and has been on Eliquis since. Today's BP is at goal - 116/66. Patient has had no complaints of any cardiac type chest pain, palpitations, dyspnea/orthopnea/PND, dizziness, claudication, or dependent edema.     Hyperlipidemia is controlled with diet & meds. Patient denies myalgias or other med SE's. Last Lipids were at goal: Lab Results  Component Value Date   CHOL 106 07/30/2016   HDL 29 (L) 07/30/2016   LDLCALC 59 07/30/2016   TRIG 91 07/30/2016   CHOLHDL 3.7 07/30/2016      Also, the patient has history of T2_NIDDM  With A1c 7.4% in 2006 and he also das CKD 2. Patient denies symptoms of reactive hypoglycemia, diabetic polys, paresthesias or visual blurring.  Last A1c was  Lab Results  Component Value Date   HGBA1C 5.5 07/30/2016      Further, the patient has history of Vitamin D Deficiency ("35" / 2006) and supplements vitamin D without any suspected side-effects. Last vitamin D was at goal:  Lab Results  Component Value Date   VD25OH 72 07/30/2016   Current Outpatient Prescriptions on File Prior to Visit  Medication Sig  . apixaban (ELIQUIS) 5 MG TABS tablet Take by mouth.  Marland Kitchen atenolol (TENORMIN) 50 MG tablet Take 1 tablet daily for BP  . atorvastatin (LIPITOR) 40 MG tablet TAKE ONE TABLET BY MOUTH ONCE DAILY FOR  CHOLESTEROL  . calcium-vitamin D (OSCAL WITH D) 500-200 MG-UNIT per tablet Take 1 tablet by mouth daily.    . Cholecalciferol (VITAMIN D PO) Take 5,000 Units by mouth daily.  . clobetasol ointment (TEMOVATE)  0.05 % Apply to eczema rash 2-3 x/da  . Flaxseed, Linseed, (FLAXSEED OIL) 1000 MG CAPS Take 1,000 mg by mouth daily.  Marland Kitchen glucose blood (ONE TOUCH ULTRA TEST) test strip Use as directed by provider. Dx: E11.9  . guaiFENesin (MUCINEX) 600 MG 12 hr tablet Take 1,200 mg by mouth 2 (two) times daily.    . metFORMIN (GLUCOPHAGE XR) 500 MG 24 hr tablet Take 2 tablets 2 / day for Diabetes  . Omega-3 Fatty Acids (FISH OIL) 1000 MG CAPS Take by mouth daily.  Marland Kitchen tiotropium (SPIRIVA) 18 MCG inhalation capsule Place 1 capsule (18 mcg total) into inhaler and inhale daily.  . traMADol (ULTRAM) 50 MG tablet Take 2 tablets (100 mg total) by mouth every 6 (six) hours as needed.  . budesonide-formoterol (SYMBICORT) 160-4.5 MCG/ACT inhaler Inhale 2 puffs into the lungs 2 (two) times daily. (Patient not taking: Reported on 12/31/2016)   No current facility-administered medications on file prior to visit.    Allergies  Allergen Reactions  . Crestor [Rosuvastatin]   . Latex Itching  . Neomycin-Bacitracin Zn-Polymyx Other (See Comments)    Causes blisters  . Ciprofloxacin Rash   PMHx:   Past Medical History:  Diagnosis Date  . COPD (chronic obstructive pulmonary disease) ([place])   . Diabetes mellitus   . Emphysema   . Hypercholesteremia   . Hypertension  Immunization History  Administered Date(s) Administered  . Pneumococcal Conjugate-13 11/13/2014  . Pneumococcal-Unspecified 04/27/2001  . Td 04/27/2002   Past Surgical History:  Procedure Laterality Date  . BLADDER REPAIR    . CHOLECYSTECTOMY    . SPLENECTOMY, TOTAL     FHx:    Reviewed / unchanged  SHx:    Reviewed / unchanged  Systems Review:  Constitutional: Denies fever, chills, wt changes, headaches, insomnia, fatigue, night sweats, change in appetite. Eyes: Denies redness, blurred vision, diplopia, discharge, itchy, watery eyes.  ENT: Denies discharge, congestion, post nasal drip, epistaxis, sore throat, earache, hearing loss, dental pain,  tinnitus, vertigo, sinus pain, snoring.  CV: Denies chest pain, palpitations, irregular heartbeat, syncope, dyspnea, diaphoresis, orthopnea, PND, claudication or edema. Respiratory: denies cough, dyspnea, DOE, pleurisy, hoarseness, laryngitis, wheezing.  Gastrointestinal: Denies dysphagia, odynophagia, heartburn, reflux, water brash, abdominal pain or cramps, nausea, vomiting, bloating, diarrhea, constipation, hematemesis, melena, hematochezia  or hemorrhoids. Genitourinary: Denies dysuria, frequency, urgency, nocturia, hesitancy, discharge, hematuria or flank pain. Musculoskeletal: Denies arthralgias, myalgias, stiffness, jt. swelling, pain, limping or strain/sprain.  Skin: Denies pruritus, rash, hives, warts, acne, eczema or change in skin lesion(s). Neuro: No weakness, tremor, incoordination, spasms, paresthesia or pain. Psychiatric: Denies confusion, memory loss or sensory loss. Endo: Denies change in weight, skin or hair change.  Heme/Lymph: No excessive bleeding, bruising or enlarged lymph nodes.  Physical Exam  BP 116/66   Pulse 72   Temp (!) 97.3 F (36.3 C)   Resp 18   Ht 5\' 10"  (1.778 m)   Wt 190 lb 12.8 oz (86.5 kg)   BMI 27.38 kg/m   Appears elderly WM with obvious blindness   and in no distress.  Eyes: PERRLA, EOMs, conjunctiva no swelling or erythema. Visual acuity intact to hand motion and finger counting.  Sinuses: No frontal/maxillary tenderness ENT/Mouth: EAC's clear, TM's nl w/o erythema, bulging. Nares clear w/o erythema, swelling, exudates. Oropharynx clear without erythema or exudates. Oral hygiene is good. Tongue normal, non obstructing. Hearing intact.  Neck: Supple. Thyroid nl. Car 2+/2+ without bruits, nodes or JVD. Chest: Respirations nl with BS clear & equal w/o rales, rhonchi, wheezing or stridor.  Cor: Heart sounds normal w/ regular rate and rhythm without sig. murmurs, gallops, clicks or rubs. Peripheral pulses normal and equal  without edema.  Abdomen:  Soft & bowel sounds normal. Non-tender w/o guarding, rebound, hernias, masses or organomegaly.  Lymphatics: Unremarkable.  Musculoskeletal: Full ROM all peripheral extremities, joint stability, 5/5 strength and normal gait.  Skin: Warm, dry without exposed rashes, lesions or ecchymosis apparent.  Neuro: Cranial nerves intact, reflexes equal bilaterally. Sensory-motor testing grossly intact. Tendon reflexes grossly intact.  Pysch: Alert & oriented x 3.  Insight and judgement nl & appropriate. No ideations.  Assessment and Plan:  1. Essential hypertension  - Continue medication, monitor blood pressure at home.  - Continue DASH diet. Reminder to go to the ER if any CP,  SOB, nausea, dizziness, severe HA, changes vision/speech. - CBC with Differential/Platelet - BASIC METABOLIC PANEL WITH GFR - Magnesium - TSH  2. Hyperlipidemia, mixed  - Continue diet/meds, exercise,& lifestyle modifications.  - Continue monitor periodic cholesterol/liver & renal functions  - Hepatic function panel - Lipid panel - TSH  3. Type 2 diabetes mellitus with stage 2 chronic kidney disease, without long-term current use of insulin (HCC)  - Continue diet, exercise, lifestyle modifications.  - Monitor appropriate labs.  - Hemoglobin A1c - Insulin, fasting  4. Vitamin D deficiency  - Continue  supplementation.  - VITAMIN D 25 Hydroxy   5. Legal blindness -macular degeneration with retinopathy   6. Eczema, unspecified type  - clobetasol ointment (TEMOVATE) 0.05 %; Apply to eczema rash 2-3 x/da  Dispense: 60 g; Refill: 99  7. Medication management   - CBC with Differential/Platelet - BASIC METABOLIC PANEL WITH GFR - Hepatic function panel - Magnesium - Lipid panel - TSH - Hemoglobin A1c - Insulin, fasting - VITAMIN D 25 Hydroxy          Discussed  regular exercise, BP monitoring, weight control to achieve/maintain BMI less than 25 and discussed med and SE's. Recommended labs to assess and  monitor clinical status with further disposition pending results of labs. Over 30 minutes of exam, counseling, chart review was performed.

## 2016-12-31 NOTE — Patient Instructions (Signed)

## 2017-01-01 ENCOUNTER — Other Ambulatory Visit: Payer: Self-pay | Admitting: Internal Medicine

## 2017-01-01 DIAGNOSIS — D72820 Lymphocytosis (symptomatic): Secondary | ICD-10-CM

## 2017-01-01 LAB — HEMOGLOBIN A1C
EAG (MMOL/L): 7.4 (calc)
HEMOGLOBIN A1C: 6.3 %{Hb} — AB (ref <5.7)
Mean Plasma Glucose: 134 (calc)

## 2017-01-01 LAB — LIPID PANEL
CHOL/HDL RATIO: 3.6 (calc) (ref <5.0)
CHOLESTEROL: 116 mg/dL (ref <200)
HDL: 32 mg/dL — ABNORMAL LOW (ref >40)
LDL Cholesterol (Calc): 65 mg/dL (calc)
Non-HDL Cholesterol (Calc): 84 mg/dL (calc) (ref <130)
TRIGLYCERIDES: 108 mg/dL (ref <150)

## 2017-01-01 LAB — CBC WITH DIFFERENTIAL/PLATELET
Basophils Absolute: 165 cells/uL (ref 0–200)
Basophils Relative: 1 %
Eosinophils Absolute: 1601 cells/uL — ABNORMAL HIGH (ref 15–500)
Eosinophils Relative: 9.7 %
HCT: 42.7 % (ref 38.5–50.0)
Hemoglobin: 14.6 g/dL (ref 13.2–17.1)
Lymphs Abs: 2211 cells/uL (ref 850–3900)
MCH: 27.7 pg (ref 27.0–33.0)
MCHC: 34.2 g/dL (ref 32.0–36.0)
MCV: 81 fL (ref 80.0–100.0)
MONOS PCT: 15.2 %
MPV: 12.7 fL — ABNORMAL HIGH (ref 7.5–12.5)
NEUTROS PCT: 60.7 %
Neutro Abs: 10016 cells/uL — ABNORMAL HIGH (ref 1500–7800)
PLATELETS: 310 10*3/uL (ref 140–400)
RBC: 5.27 10*6/uL (ref 4.20–5.80)
RDW: 13.7 % (ref 11.0–15.0)
TOTAL LYMPHOCYTE: 13.4 %
WBC mixed population: 2508 cells/uL — ABNORMAL HIGH (ref 200–950)
WBC: 16.5 10*3/uL — AB (ref 3.8–10.8)

## 2017-01-01 LAB — HEPATIC FUNCTION PANEL
AG Ratio: 1.4 (calc) (ref 1.0–2.5)
ALT: 12 U/L (ref 9–46)
AST: 13 U/L (ref 10–35)
Albumin: 3.9 g/dL (ref 3.6–5.1)
Alkaline phosphatase (APISO): 71 U/L (ref 40–115)
Bilirubin, Direct: 0.2 mg/dL (ref 0.0–0.2)
Globulin: 2.7 g/dL (calc) (ref 1.9–3.7)
Indirect Bilirubin: 0.5 mg/dL (calc) (ref 0.2–1.2)
TOTAL PROTEIN: 6.6 g/dL (ref 6.1–8.1)
Total Bilirubin: 0.7 mg/dL (ref 0.2–1.2)

## 2017-01-01 LAB — BASIC METABOLIC PANEL WITH GFR
BUN: 14 mg/dL (ref 7–25)
CHLORIDE: 98 mmol/L (ref 98–110)
CO2: 28 mmol/L (ref 20–32)
Calcium: 9.5 mg/dL (ref 8.6–10.3)
Creat: 0.78 mg/dL (ref 0.70–1.18)
GFR, Est African American: 100 mL/min/{1.73_m2} (ref >=60)
GFR, Est Non African American: 86 mL/min/{1.73_m2} (ref >=60)
GLUCOSE: 80 mg/dL (ref 65–99)
POTASSIUM: 4.4 mmol/L (ref 3.5–5.3)
Sodium: 136 mmol/L (ref 135–146)

## 2017-01-01 LAB — INSULIN, FASTING: INSULIN: 3.1 u[IU]/mL (ref 2.0–19.6)

## 2017-01-01 LAB — TSH: TSH: 1.81 m[IU]/L (ref 0.40–4.50)

## 2017-01-01 LAB — MAGNESIUM: MAGNESIUM: 1.3 mg/dL — AB (ref 1.5–2.5)

## 2017-01-01 LAB — VITAMIN D 25 HYDROXY (VIT D DEFICIENCY, FRACTURES): Vit D, 25-Hydroxy: 50 ng/mL (ref 30–100)

## 2017-01-02 ENCOUNTER — Encounter: Payer: Self-pay | Admitting: Internal Medicine

## 2017-01-04 ENCOUNTER — Other Ambulatory Visit: Payer: Self-pay | Admitting: Internal Medicine

## 2017-01-14 ENCOUNTER — Other Ambulatory Visit: Payer: Medicare Other

## 2017-01-14 DIAGNOSIS — D72820 Lymphocytosis (symptomatic): Secondary | ICD-10-CM

## 2017-01-20 LAB — LEUKEMIA/LYMPHOMA EVALUATION PANEL
Number of Markers:: 22
VIABILITY: 99 %

## 2017-01-21 ENCOUNTER — Institutional Professional Consult (permissible substitution): Payer: Self-pay | Admitting: Radiation Oncology

## 2017-02-03 ENCOUNTER — Ambulatory Visit: Payer: Medicare Other | Admitting: Radiation Oncology

## 2017-02-23 ENCOUNTER — Telehealth: Payer: Self-pay | Admitting: *Deleted

## 2017-02-23 NOTE — Telephone Encounter (Signed)
Last labs results were mailed to the patient at his request.

## 2017-04-12 ENCOUNTER — Other Ambulatory Visit: Payer: Self-pay

## 2017-04-12 MED ORDER — GLUCOSE BLOOD VI STRP: ORAL_STRIP | 6 refills | Status: DC

## 2017-04-14 ENCOUNTER — Other Ambulatory Visit: Payer: Self-pay | Admitting: *Deleted

## 2017-04-14 MED ORDER — GLUCOSE BLOOD VI STRP: ORAL_STRIP | 6 refills | Status: AC

## 2017-04-21 NOTE — Progress Notes (Signed)
MEDICARE ANNUAL WELLNESS VISIT AND FOLLOW UP Assessment:   Essential hypertension - continue medications, DASH diet, exercise and monitor at home. Call if greater than 130/80.  -     CBC with Differential/Platelet -     BASIC METABOLIC PANEL WITH GFR -     Hepatic function panel -     TSH -     EKG 12-Lead  Paroxysmal atrial fibrillation (HCC) Continue cardio follow up Continue elliquis  Severe chronic obstructive pulmonary disease (HCC) Monitor  Type 2 diabetes mellitus with diabetic nephropathy, without long-term current use of insulin ([place]) Discussed general issues about diabetes pathophysiology and management., Educational material distributed., Suggested low cholesterol diet., Encouraged aerobic exercise., Discussed foot care., Reminded to get yearly retinal exam. -     Hemoglobin A1c  Melanoma of skin ([place]) Follow up dermatology  Hyperlipidemia -continue medications, check lipids, decrease fatty foods, increase activity.  -     Lipid panel  Medication management -     Magnesium  Vitamin D deficiency Continue supplement  Pulmonary nodule, right upper lobe, 1cm, new since 2012 Continue follow up Patient states he does not want treatment  History of splenectomy-for hereditary spherocytosis Declines vaccinations Monitor  BPH/prostatism Monitor  Bilateral hearing loss, unspecified hearing loss type continue hearing aids  Legal blindness -macular degeneration with retinopathy Continue eye appointments, get follow up  Encounter for Medicare annual wellness exam 1 year  At high risk for falls Monitor  BMI 25.0-25.9,adult  Overweight  - long discussion about weight loss, diet, and exercise -recommended diet heavy in fruits and veggies and low in animal meats, cheeses, and dairy products  Advanced care planning/counseling discussion Discussed advanced care planning with patient and/or family At least 30 mins discussed with the patient and/or family at the  visit.   Lichen planus -     triamcinolone ointment (KENALOG) 0.1 %; Apply 1 application topically 2 (two) times daily. - use occlusive dressing at night - follow up if not better    Future Appointments  Date Time Provider [place]  04/22/2017  9:00 AM Vicie Mutters, PA-C [id number] None  05/03/2018  9:00 AM Vicie Mutters, PA-C [facility] None      Plan:   During the course of the visit the patient was educated and counseled about appropriate screening and preventive services including:    Pneumococcal vaccine   Influenza vaccine  Td vaccine  Screening electrocardiogram  Colorectal cancer screening  Diabetes screening  Glaucoma screening  Nutrition counseling    Subjective:  [name] is a 76 y.o. male who presents for Medicare Annual Wellness Visit and 3 month follow up for HTN, hyperlipidemia, diabetes and vitamin D Def.   Was spraying weed killer back in July, had rash on all exposed skin, has improved but now has rash bilateral elbows, still some itching when he does not put vaseline on it.   His blood pressure has been controlled at home, today their BP is BP: 132/76 He does not workout. He denies chest pain, shortness of breath, dizziness.  He has pAfib, on elliquis and atenolol.  He has severe COPD and has lung nodule that has increased in size, being followed by Dr. Wayland Denis, he is being sent to radiologist for potential treatment but declines surgical intervention and states he does not want to see the radiologist as well.  He is on cholesterol medication, lipitor 40 and denies myalgias. His cholesterol is at goal. The cholesterol last visit was:   Lab Results  Component Value Date   CHOL 116 12/31/2016   HDL 32 (L) 12/31/2016   LDLCALC 59 07/30/2016   TRIG 108 12/31/2016   CHOLHDL 3.6 12/31/2016   He has been working on diet and exercise for diabetes with CKD stage 2 and retinopathy, he is on ACE, metformin but has been out of it for  several weeks, he is on bASA and denies hypoglycemia , paresthesia of the feet, polydipsia and polyuria. Last A1C in the office was:  Lab Results  Component Value Date   HGBA1C 6.3 (H) 12/31/2016   Lab Results  Component Value Date   GFRNONAA 86 12/31/2016   Patient is on Vitamin D supplement.   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  BMI is Body mass index is 25.97 kg/m., he is working on diet and exercise. Wt Readings from Last 3 Encounters:  04/22/17 181 lb (82.1 kg)  12/31/16 190 lb 12.8 oz (86.5 kg)  09/10/16 186 lb 6.4 oz (84.6 kg)    Names of Other Physician/Practitioners you currently use: 1. [facility] here for primary care 2. Dr. Delman Cheadle, DIABETIC eye doctor, last visit 2017 3. No dentist dentures  Patient Care Team: Unk Pinto, MD as PCP - General (Internal Medicine) Minus Breeding, MD as Consulting Physician (Cardiology) Gatha Mayer, MD as Consulting Physician (Gastroenterology) Carolan Clines, MD as Consulting Physician (Urology) Crista Luria, MD as Consulting Physician (Dermatology) Brand Males, MD as Consulting Physician (Pulmonary Disease)  Medication Review: Current Outpatient Medications on File Prior to Visit  Medication Sig Dispense Refill  . apixaban (ELIQUIS) 5 MG TABS tablet Take by mouth.    Marland Kitchen atenolol (TENORMIN) 50 MG tablet Take 1 tablet daily for BP 90 tablet 3  . atorvastatin (LIPITOR) 40 MG tablet TAKE ONE TABLET BY MOUTH ONCE DAILY FOR  CHOLESTEROL 90 tablet 1  . budesonide-formoterol (SYMBICORT) 160-4.5 MCG/ACT inhaler Inhale 2 puffs into the lungs 2 (two) times daily. 1 Inhaler 12  . calcium-vitamin D (OSCAL WITH D) 500-200 MG-UNIT per tablet Take 1 tablet by mouth daily.      . Cholecalciferol (VITAMIN D PO) Take 5,000 Units by mouth daily.    . clobetasol ointment (TEMOVATE) 0.05 %  Apply to eczema rash 2-3 x/da 60 g 99  . Flaxseed, Linseed, (FLAXSEED OIL) 1000 MG CAPS Take 1,000 mg by mouth daily.    Marland Kitchen glucose blood (ONE TOUCH ULTRA TEST) test strip Check blood sugar 1 time a day. Dx: E11.9 100 each 6  . guaiFENesin (MUCINEX) 600 MG 12 hr tablet Take 1,200 mg by mouth 2 (two) times daily.      . metFORMIN (GLUCOPHAGE XR) 500 MG 24 hr tablet Take 2 tablets 2 / day for Diabetes 360 tablet 3  . Omega-3 Fatty Acids (FISH OIL) 1000 MG CAPS Take by mouth daily.    Marland Kitchen OVER THE COUNTER MEDICATION Patient takes OTC folic acid tablet daily.    Marland Kitchen tiotropium (SPIRIVA) 18 MCG inhalation capsule Place 1 capsule (18 mcg total) into inhaler and inhale daily. 30 capsule 4  . traMADol (ULTRAM) 50 MG tablet Take 2 tablets (100 mg total) by mouth every 6 (six) hours as needed. 180 tablet 0   No current facility-administered medications on file prior to visit.     Current Problems (verified) Patient Active Problem List   Diagnosis Date Noted  .  Hearing loss 11/13/2014  . Atrial fibrillation ([place]) 11/13/2014  . At high risk for falls 11/13/2014  . Encounter for Medicare annual wellness exam 11/07/2014  . Legal blindness -macular degeneration with retinopathy 08/06/2014  . Severe chronic obstructive pulmonary disease ([place]) 10/26/2013  . Pulmonary nodule, right upper lobe, 1cm, new since 2012 03/21/2013  . Essential hypertension 03/09/2013  . Vitamin D deficiency 03/09/2013  . Medication management 03/09/2013  . Melanoma of skin ([place]) 12/28/2012  . History of splenectomy-for hereditary spherocytosis 12/28/2012  . 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 Hyperlipidemia 10/03/2008  . Obesity 10/03/2008  . BPH/prostatism 10/03/2008    Screening Tests Health Maintenance  Topic Date Due  . Eye exam for diabetics  05/08/1950  . Tetanus Vaccine  04/27/2012  . Complete foot exam   11/13/2015  . Pneumonia vaccines (2 of 2 - PPSV23) 11/13/2015  . Urine Protein Check  12/04/2016  . Flu Shot   12/23/2017*  . Hemoglobin A1C  06/30/2017  *Topic was postponed. The date shown is not the original due date.    Immunization History  Administered Date(s) Administered  . Pneumococcal Conjugate-13 11/13/2014  . Pneumococcal-Unspecified 04/27/2001  . Td 04/27/2002   Preventative care: Last colonoscopy: 2010  Prior vaccinations: TD or Tdap: 2004, declines  Influenza: declines Prevnar 13: 2016 Pneumococcal: 2003- states got [facility] Shingles/Zostavax: declines  Allergies Allergies  Allergen Reactions  . Crestor [Rosuvastatin]   . Latex Itching  . Neomycin-Bacitracin Zn-Polymyx Other (See Comments)    Causes blisters  . Ciprofloxacin Rash    SURGICAL HISTORY He  has a past surgical history that includes Bladder repair; Cholecystectomy; and Splenectomy, total. FAMILY HISTORY His family history includes Heart disease in his father and mother. SOCIAL HISTORY He  reports that he quit smoking about 23 years ago. His smoking use included cigarettes. He has a 135.00 pack-year smoking history. he has never used smokeless tobacco. He reports that he does not drink alcohol or use drugs.   MEDICARE WELLNESS OBJECTIVES: Physical activity:   Cardiac risk factors:   Depression/mood screen:   Depression screen [facility] 2/9 01/02/2017  Decreased Interest 0  Down, Depressed, Hopeless -  PHQ - 2 Score 0    ADLs:  In your present state of health, do you have any difficulty performing the following activities: 01/02/2017 09/10/2016  Hearing? Y Y  Comment bilat hearing aide bilat hearing aids  Vision? Y Y  Comment hand motion & finger counting vision decreased to hand motion  Difficulty concentrating or making decisions? N N  Walking or climbing stairs? N N  Dressing or bathing? N N  Doing errands, shopping? N Y  Comment - daughter transports  Some recent data might be hidden    Cognitive Testing  Alert? Yes  Normal Appearance?Yes  Oriented to person? Yes  Place? Yes   Time?  Yes  Recall of three objects?  2/3  Can perform simple calculations? Yes  Displays appropriate judgment?Yes  Can read the correct time from a watch face?Yes  EOL planning: Does Patient Have a Medical Advance Directive?: Yes Type of Advance Directive: Healthcare Power of Attorney, Living will [place] in Chart?: No - copy requested    Objective:   Blood pressure 132/76, pulse 97, temperature (!) 97.5 F (36.4 C), resp. rate 14, height _0  (1.778 m), weight 181 lb (82.1 kg), SpO2 98 %. Body mass index is 25.97 kg/m.  General appearance: alert, no distress, WD/WN, male HEENT: normocephalic, sclerae  anicteric, TMs pearly, nares patent, no discharge or erythema, pharynx normal Oral cavity: MMM, no lesions Neck: supple, no lymphadenopathy, no thyromegaly, no masses Heart: RRR, normal S1, S2, 2/6 systolic murmur Lungs: Bilateral rhonci, no distress Abdomen: +bs, soft, obese non tender, non distended, no masses, no hepatomegaly Musculoskeletal: nontender, no swelling, no obvious deformity Extremities: 1-2+ edema, no cyanosis, no clubbing Pulses: 2+ symmetric upper , decreased bilateral lower extremities, normal cap refill Neurological: alert, oriented x 3, CN2-12 intact, strength 4/5 upper extremities and lower extremities, sensation normal throughout, DTRs 2+ throughout, no cerebellar signs, gait analgic Psychiatric: normal affect, behavior normal, pleasant  Skin: right elbow with thick erythematous patch at elbow, smaller patch on left elbow.   Medicare Attestation I have personally reviewed: The patient's medical and social history Their use of alcohol, tobacco or illicit drugs Their current medications and supplements The patient's functional ability including ADLs,fall risks, home safety risks, cognitive, and hearing and visual impairment Diet and physical activities Evidence for depression or mood disorders  The patient's weight, height, BMI, and  visual acuity have been recorded in the chart.  I have made referrals, counseling, and provided education to the patient based on review of the above and I have provided the patient with a written personalized care plan for preventive services.     Vicie Mutters, PA-C   04/22/2017

## 2017-04-22 ENCOUNTER — Ambulatory Visit: Payer: Medicare Other | Admitting: Physician Assistant

## 2017-04-22 ENCOUNTER — Encounter: Payer: Self-pay | Admitting: Physician Assistant

## 2017-04-22 VITALS — BP 132/76 | HR 97 | Temp 97.5°F | Resp 14 | Ht 70.0 in | Wt 181.0 lb

## 2017-04-22 DIAGNOSIS — C439 Malignant melanoma of skin, unspecified: Secondary | ICD-10-CM | POA: Diagnosis not present

## 2017-04-22 DIAGNOSIS — H548 Legal blindness, as defined in USA: Secondary | ICD-10-CM

## 2017-04-22 DIAGNOSIS — Z6825 Body mass index (BMI) 25.0-25.9, adult: Secondary | ICD-10-CM

## 2017-04-22 DIAGNOSIS — E1121 Type 2 diabetes mellitus with diabetic nephropathy: Secondary | ICD-10-CM | POA: Diagnosis not present

## 2017-04-22 DIAGNOSIS — J449 Chronic obstructive pulmonary disease, unspecified: Secondary | ICD-10-CM | POA: Diagnosis not present

## 2017-04-22 DIAGNOSIS — N320 Bladder-neck obstruction: Secondary | ICD-10-CM

## 2017-04-22 DIAGNOSIS — I10 Essential (primary) hypertension: Secondary | ICD-10-CM

## 2017-04-22 DIAGNOSIS — Z7189 Other specified counseling: Secondary | ICD-10-CM

## 2017-04-22 DIAGNOSIS — R6889 Other general symptoms and signs: Secondary | ICD-10-CM

## 2017-04-22 DIAGNOSIS — Z0001 Encounter for general adult medical examination with abnormal findings: Secondary | ICD-10-CM

## 2017-04-22 DIAGNOSIS — H9193 Unspecified hearing loss, bilateral: Secondary | ICD-10-CM

## 2017-04-22 DIAGNOSIS — E782 Mixed hyperlipidemia: Secondary | ICD-10-CM | POA: Diagnosis not present

## 2017-04-22 DIAGNOSIS — L439 Lichen planus, unspecified: Secondary | ICD-10-CM

## 2017-04-22 DIAGNOSIS — Z79899 Other long term (current) drug therapy: Secondary | ICD-10-CM

## 2017-04-22 DIAGNOSIS — Z0000 Encounter for general adult medical examination without abnormal findings: Secondary | ICD-10-CM

## 2017-04-22 DIAGNOSIS — E559 Vitamin D deficiency, unspecified: Secondary | ICD-10-CM | POA: Diagnosis not present

## 2017-04-22 DIAGNOSIS — Z9081 Acquired absence of spleen: Secondary | ICD-10-CM

## 2017-04-22 DIAGNOSIS — I480 Paroxysmal atrial fibrillation: Secondary | ICD-10-CM | POA: Diagnosis not present

## 2017-04-22 DIAGNOSIS — E09311 Drug or chemical induced diabetes mellitus with unspecified diabetic retinopathy with macular edema: Secondary | ICD-10-CM

## 2017-04-22 DIAGNOSIS — R911 Solitary pulmonary nodule: Secondary | ICD-10-CM

## 2017-04-22 DIAGNOSIS — Z9181 History of falling: Secondary | ICD-10-CM

## 2017-04-22 MED ORDER — TRIAMCINOLONE ACETONIDE 0.1 % EX OINT: 1.0000 "application " | TOPICAL_OINTMENT | Freq: Two times a day (BID) | CUTANEOUS | 1 refills | Status: AC

## 2017-04-22 NOTE — Patient Instructions (Addendum)
Put ointment on bilateral elbows at night and wrap with reynolds wrap at night for 7 -10 days Can take off the wrap in the morning and put vaseline on it in the morning After 7-10 days can apply ointment as needed  Can add multivitamin  Ways to prevent diarrhea with magnesium:  1) Don't take all your magnesium at the same time, have 2-3 smaller doses through out the day 2) Try taking your magnesium with high fiber meals.  3) If this does not help, take the magnesium on an empty stomach. Fiber for some people can bind the magnesium too well and prevent absorption in your gut.  4) Lastly try different types of magnesium. Most people are taking magnesium citrate, you can also try dimalate capsules which are slow release. You can also find magnesium lotions/sprays for the skin that bypass the gut. Another one that has good absorption is ReMag (pico-iconic magnesium formula), this has great cellular absorption so less of a laxative effect. You can find these type at health food stores or online.   Your ears and sinuses are connected by the eustachian tube. When your sinuses are inflamed, this can close off the tube and cause fluid to collect in your middle ear. This can then cause dizziness, popping, clicking, ringing, and echoing in your ears. This is often NOT an infection and does NOT require antibiotics, it is caused by inflammation so the treatments help the inflammation. This can take a long time to get better so please be patient.  Here are things you can do to help with this: - Try the Flonase or Nasonex. Remember to spray each nostril twice towards the outer part of your eye.  Do not sniff but instead pinch your nose and tilt your head back to help the medicine get into your sinuses.  The best time to do this is at bedtime.Stop if you get blurred vision or nose bleeds.  -While drinking fluids, pinch and hold nose close and swallow, to help open eustachian tubes to drain fluid behind ear  drums. -Please pick one of the over the counter allergy medications below and take it once daily for allergies.  It will also help with fluid behind ear drums. Claritin or loratadine cheapest but likely the weakest  Zyrtec or certizine at night because it can make you sleepy The strongest is allegra or fexafinadine  Cheapest at [employer], [employer], [employer] -can use decongestant over the counter, please do not use if you have high blood pressure or certain heart conditions.   if worsening HA, changes vision/speech, imbalance, weakness go to the ER     Lichen Planus Lichen planus is a skin problem that causes redness, itching, small bumps, and sores. It can affect the skin in any area of the body. Some common areas affected include:  Arms, wrists, legs, or ankles.  Chest, back, or abdomen.  Genital areas such as the vulva and vagina.  Gums and inside of the mouth.  Scalp.  Fingernails or toenails.  Treatment can help control the symptoms of this condition. The condition can last for a long time. It can take 6-18 months for it to go away. What are the causes? The exact cause of this condition is not known. The condition is not passed from one person to another (not contagious). It may be related to an allergy or an autoimmune response. An autoimmune response occurs when the body's defense system (immune system) mistakenly attacks healthy tissues. What increases the risk? This  condition is more likely to develop in:  People who are older than 78 years of age.  People who take certain medicines.  People who have been exposed to certain dyes or chemicals.  People with hepatitis C.  What are the signs or symptoms? Symptoms of this condition may include:  Itching, which can be severe.  Small reddish or purple bumps on the skin. These may have flat tops and may be round or irregular shaped.  Redness or white patches on the gums or tongue.  Redness, soreness, or a burning feeling  in the genital area. This may lead to pain or bleeding during sex.  Changes in the fingernails or toenails. The nails may become thin or rough. They may have ridges in them.  Redness or irritation of the eyes. This is rare.  How is this diagnosed? This condition may be diagnosed based on:  A physical exam. The health care provider will examine your affected skin and check for changes inside your mouth.  Removal of a tissue sample (biopsy sample) to be looked at under a microscope.  How is this treated? Treatment for this condition may depend on the severity of symptoms. In some cases, no treatment is needed. If treatment is needed to control symptoms, it may include:  Creams or ointments (topical steroids) to help control itching and irritation.  Medicine to be taken by mouth.  A treatment in which your skin is exposed to ultraviolet light (phototherapy).  Lozenges that you suck on to help treat sores in the mouth.  Follow these instructions at home:  Take over-the-counter and prescription medicines only as told by your health care provider.  Use creams or ointments as told by your health care provider.  Do not scratch the affected areas of skin.  Women should keep the vaginal area as clean and dry as possible. Contact a health care provider if:  You have increasing redness, swelling, or pain in the affected area.  You have fluid, blood, or pus coming from the affected area.  Your eyes become irritated. This information is not intended to replace advice given to you by your health care provider. Make sure you discuss any questions you have with your health care provider. Document Released: 09/04/2010 Document Revised: 09/19/2015 Document Reviewed: 07/09/2014 Elsevier Interactive Patient Education  [employer].

## 2017-04-23 LAB — URINALYSIS, ROUTINE W REFLEX MICROSCOPIC
BILIRUBIN URINE: NEGATIVE
Bacteria, UA: NONE SEEN /HPF
Glucose, UA: NEGATIVE
HYALINE CAST: NONE SEEN /LPF
Hgb urine dipstick: NEGATIVE
Ketones, ur: NEGATIVE
Leukocytes, UA: NEGATIVE
Nitrite: NEGATIVE
RBC / HPF: NONE SEEN /HPF (ref 0–2)
SQUAMOUS EPITHELIAL / LPF: NONE SEEN /HPF (ref <=5)
Specific Gravity, Urine: 1.021 (ref 1.001–1.03)
WBC, UA: NONE SEEN /HPF (ref 0–5)
pH: 5.5 (ref 5.0–8.0)

## 2017-04-23 LAB — HEPATIC FUNCTION PANEL
AG RATIO: 1.6 (calc) (ref 1.0–2.5)
ALT: 11 U/L (ref 9–46)
AST: 12 U/L (ref 10–35)
Albumin: 4.1 g/dL (ref 3.6–5.1)
Alkaline phosphatase (APISO): 75 U/L (ref 40–115)
BILIRUBIN DIRECT: 0.2 mg/dL (ref 0.0–0.2)
BILIRUBIN INDIRECT: 0.6 mg/dL (ref 0.2–1.2)
GLOBULIN: 2.6 g/dL (ref 1.9–3.7)
Total Bilirubin: 0.8 mg/dL (ref 0.2–1.2)
Total Protein: 6.7 g/dL (ref 6.1–8.1)

## 2017-04-23 LAB — CBC WITH DIFFERENTIAL/PLATELET
BASOS PCT: 0.9 %
Basophils Absolute: 148 cells/uL (ref 0–200)
EOS ABS: 1689 {cells}/uL — AB (ref 15–500)
Eosinophils Relative: 10.3 %
HCT: 43.2 % (ref 38.5–50.0)
Hemoglobin: 14.9 g/dL (ref 13.2–17.1)
Lymphs Abs: 2263 cells/uL (ref 850–3900)
MCH: 28.4 pg (ref 27.0–33.0)
MCHC: 34.5 g/dL (ref 32.0–36.0)
MCV: 82.4 fL (ref 80.0–100.0)
MPV: 10.8 fL (ref 7.5–12.5)
Monocytes Relative: 14.8 %
Neutro Abs: 9873 cells/uL — ABNORMAL HIGH (ref 1500–7800)
Neutrophils Relative %: 60.2 %
PLATELETS: 415 10*3/uL — AB (ref 140–400)
RBC: 5.24 10*6/uL (ref 4.20–5.80)
RDW: 13.6 % (ref 11.0–15.0)
TOTAL LYMPHOCYTE: 13.8 %
WBC: 16.4 10*3/uL — ABNORMAL HIGH (ref 3.8–10.8)
WBCMIX: 2427 {cells}/uL — AB (ref 200–950)

## 2017-04-23 LAB — MICROALBUMIN / CREATININE URINE RATIO
Creatinine, Urine: 144 mg/dL (ref 20–320)
MICROALB/CREAT RATIO: 515 ug/mg{creat} — AB (ref <30)
Microalb, Ur: 74.1 mg/dL

## 2017-04-23 LAB — LIPID PANEL
CHOL/HDL RATIO: 3.5 (calc) (ref <5.0)
Cholesterol: 119 mg/dL (ref <200)
HDL: 34 mg/dL — ABNORMAL LOW (ref >40)
LDL CHOLESTEROL (CALC): 64 mg/dL
Non-HDL Cholesterol (Calc): 85 mg/dL (calc) (ref <130)
Triglycerides: 125 mg/dL (ref <150)

## 2017-04-23 LAB — HEMOGLOBIN A1C
HEMOGLOBIN A1C: 5.8 %{Hb} — AB (ref <5.7)
MEAN PLASMA GLUCOSE: 120 (calc)
eAG (mmol/L): 6.6 (calc)

## 2017-04-23 LAB — MAGNESIUM: Magnesium: 1.3 mg/dL — ABNORMAL LOW (ref 1.5–2.5)

## 2017-04-23 LAB — BASIC METABOLIC PANEL WITH GFR
BUN: 13 mg/dL (ref 7–25)
CO2: 32 mmol/L (ref 20–32)
CREATININE: 0.86 mg/dL (ref 0.70–1.18)
Calcium: 9.9 mg/dL (ref 8.6–10.3)
Chloride: 101 mmol/L (ref 98–110)
GFR, EST AFRICAN AMERICAN: 96 mL/min/{1.73_m2} (ref >=60)
GFR, Est Non African American: 83 mL/min/{1.73_m2} (ref >=60)
Glucose, Bld: 110 mg/dL — ABNORMAL HIGH (ref 65–99)
Potassium: 4.6 mmol/L (ref 3.5–5.3)
Sodium: 140 mmol/L (ref 135–146)

## 2017-04-23 LAB — TSH: TSH: 3.17 mIU/L (ref 0.40–4.50)

## 2017-04-28 ENCOUNTER — Encounter: Payer: Self-pay | Admitting: *Deleted

## 2017-04-30 ENCOUNTER — Other Ambulatory Visit: Payer: Self-pay | Admitting: Internal Medicine

## 2017-04-30 MED ORDER — PREDNISONE 20 MG PO TABS: ORAL_TABLET | ORAL | 0 refills | Status: DC

## 2017-04-30 MED ORDER — PROMETHAZINE-DM 6.25-15 MG/5ML PO SYRP: ORAL_SOLUTION | ORAL | 1 refills | Status: AC

## 2017-04-30 MED ORDER — AZITHROMYCIN 250 MG PO TABS: ORAL_TABLET | ORAL | 1 refills | Status: DC

## 2017-07-27 NOTE — Progress Notes (Signed)
FOLLOW UP  Assessment and Plan:   Severe COPD Continue inhalers, symptoms stable today Encouraged follow up with pulmonology  A. Fib Rate controlled today, no falls or concerns of excessive bleeding Reminded to go to ER for any significant injury Continue elequis, encouraged follow up as recommended with cardiology  Hypertension Well controlled with current medications  Monitor blood pressure at home; patient to call if consistently greater than 130/80 Continue DASH diet.   Reminder to go to the ER if any CP, SOB, nausea, dizziness, severe HA, changes vision/speech, left arm numbness and tingling and jaw pain.  Cholesterol Currently at goal; continue statin therapy Continue low cholesterol diet and exercise.  Check lipid panel.   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  Perform daily foot/skin check, notify office of any concerning changes.  Check A1C  BMI 26 Long discussion about weight loss, diet, and exercise Recommended diet heavy in fruits and veggies and low in animal meats, cheeses, and dairy products, appropriate calorie intake Discussed ideal weight for height  Patient will work on reducing sugar in diet Will follow up in 3 months  Vitamin D Def At goal at last visit; continue supplementation to maintain goal of 70-100 Defer Vit D level  Continue diet and meds as discussed. Further disposition pending results of labs. Discussed med's effects and SE's.   Over 30 minutes of exam, counseling, chart review, and critical decision making was performed.   Future Appointments  Date Time Provider [place]  11/12/2017 11:00 AM Unk Pinto, MD [facility] None  02/18/2018  9:00 AM Vicie Mutters, PA-C [facility] None  04/22/2018 10:30 AM Liane Comber,  NP [facility] None    ----------------------------------------------------------------------------------------------------------------------  HPI 77 y.o. male  presents for 3 month follow up on hypertension, cholesterol, diabetes, severe COPD managed on inhalers, and vitamin D deficiency. 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 He also has a RUL lung nodule previously followed by pulmonologist, Dr Raul Del in [place], and a. Fib previously followed by Dr. Clayborn Bigness at [facility] cardiology and on elequis. Patient has since apparently refused further follow up and is now managed by our clinic.   BMI is Body mass index is 26.69 kg/m., he has not been working on diet and exercise. Wt Readings from Last 3 Encounters:  07/29/17 186 lb (84.4 kg)  04/22/17 181 lb (82.1 kg)  12/31/16 190 lb 12.8 oz (86.5 kg)   His blood pressure has been controlled at home, today their BP is BP: 122/68  He does not workout. He denies chest pain, shortness of breath, dizziness.   He is on cholesterol medication (atorvastatin 40 mg daily) and denies myalgias. His cholesterol is at goal. The cholesterol last visit was:   Lab Results  Component Value Date   CHOL 119 04/22/2017   HDL 34 (L) 04/22/2017   LDLCALC 64 04/22/2017   TRIG 125 04/22/2017   CHOLHDL 3.5 04/22/2017    He has not been working on diet and exercise for T2DM controlled on metformin, and denies foot ulcerations, hyperglycemia, hypoglycemia , increased appetite, nausea, polydipsia, polyuria, vomiting and weight loss. He does check a fasting sugar daily and reports average range of 100-130. Last A1C in the office was:  Lab Results  Component Value Date   HGBA1C 5.8 (H) 04/22/2017  Patient is on Vitamin D supplement but remained below goal of 70 at last check:    Lab Results  Component Value Date   VD25OH 50 12/31/2016        Current Medications:  Current Outpatient Medications on  File Prior to Visit  Medication Sig  . apixaban (ELIQUIS) 5 MG TABS tablet Take by mouth.  Marland Kitchen atenolol (TENORMIN) 50 MG tablet Take 1 tablet daily for BP  . atorvastatin (LIPITOR) 40 MG tablet TAKE 1 TABLET BY MOUTH ONCE DAILY FOR CHOLESTEROL  . budesonide-formoterol (SYMBICORT) 160-4.5 MCG/ACT inhaler Inhale 2 puffs into the lungs 2 (two) times daily.  . calcium-vitamin D (OSCAL WITH D) 500-200 MG-UNIT per tablet Take 1 tablet by mouth daily.    . Cholecalciferol (VITAMIN D PO) Take 5,000 Units by mouth daily.  . clobetasol ointment (TEMOVATE) 0.05 % Apply to eczema rash 2-3 x/da  . Flaxseed, Linseed, (FLAXSEED OIL) 1000 MG CAPS Take 1,000 mg by mouth daily.  Marland Kitchen glucose blood (ONE TOUCH ULTRA TEST) test strip Check blood sugar 1 time a day. Dx: E11.9  . guaiFENesin (MUCINEX) 600 MG 12 hr tablet Take 1,200 mg by mouth 2 (two) times daily.    . metFORMIN (GLUCOPHAGE XR) 500 MG 24 hr tablet Take 2 tablets 2 / day for Diabetes  . Omega-3 Fatty Acids (FISH OIL) 1000 MG CAPS Take by mouth daily.  Marland Kitchen OVER THE COUNTER MEDICATION Patient takes OTC folic acid tablet daily.  . promethazine-dextromethorphan (PROMETHAZINE-DM) 6.25-15 MG/5ML syrup Take 1 to 2 tsp enery 4 hours if needed for cough  . tiotropium (SPIRIVA) 18 MCG inhalation capsule Place 1 capsule (18 mcg total) into inhaler and inhale daily.  . traMADol (ULTRAM) 50 MG tablet Take 2 tablets (100 mg total) by mouth every 6 (six) hours as needed.  . triamcinolone ointment (KENALOG) 0.1 % Apply 1 application topically 2 (two) times daily.  Marland Kitchen azithromycin (ZITHROMAX) 250 MG tablet Take 2 tablets (500 mg) on  Day 1,  followed by 1 tablet (250 mg) once daily on Days 2 through 5. (Patient not taking: Reported on 07/29/2017)  . predniSONE (DELTASONE) 20 MG tablet 1 tab 3 x day for 3 days, then 1 tab 2 x day for 3 days, then 1 tab 1 x day for 5 days (Patient not taking: Reported on 07/29/2017)   No current facility-administered medications on file prior to  visit.      Allergies:  Allergies  Allergen Reactions  . Crestor [Rosuvastatin]   . Latex Itching  . Neomycin-Bacitracin Zn-Polymyx Other (See Comments)    Causes blisters  . Ciprofloxacin Rash     Medical History:  Past Medical History:  Diagnosis Date  . COPD (chronic obstructive pulmonary disease) ([place])   . Diabetes mellitus   . Emphysema   . Hypercholesteremia   . Hypertension    Family history- Reviewed and unchanged Social history- Reviewed and unchanged   Review of Systems:  Review of Systems  Constitutional: Negative for malaise/fatigue and weight loss.  HENT: Negative for hearing loss and tinnitus.   Eyes: Negative for blurred vision and double vision.  Respiratory: Negative for cough, shortness of breath and wheezing.   Cardiovascular: Negative for chest pain, palpitations, orthopnea, claudication and leg swelling.  Gastrointestinal: Negative for abdominal pain, blood in stool, constipation, diarrhea, heartburn, melena, nausea and vomiting.  Genitourinary: Negative.   Musculoskeletal: Positive for joint pain (Intermittent bilateral knee pain). Negative for myalgias.  Skin: Negative for rash.  Neurological: Negative for dizziness,  tingling, sensory change, weakness and headaches.  Endo/Heme/Allergies: Negative for polydipsia.  Psychiatric/Behavioral: Negative.   All other systems reviewed and are negative.   Physical Exam: BP 122/68   Pulse 82   Temp 97.9 F (36.6 C)   Ht 5\' 10"  (1.778 m)   Wt 186 lb (84.4 kg)   SpO2 97%   BMI 26.69 kg/m  Wt Readings from Last 3 Encounters:  07/29/17 186 lb (84.4 kg)  04/22/17 181 lb (82.1 kg)  12/31/16 190 lb 12.8 oz (86.5 kg)   General Appearance: Well nourished, in no apparent distress. Eyes: PERRL,  conjunctiva no swelling or erythema, very poor vision, reports seeing vague shapes only ENT/Mouth: Ext aud canals clear, TMs without erythema, bulging. No erythema, swelling, or exudate on post pharynx.  Tonsils  not swollen or erythematous. HOH with hearing aids.  Neck: Supple, thyroid normal.  Respiratory: Respiratory effort normal, BS equal bilaterally with scant rhonchi to bilateral lower lobes without rales, wheezing or stridor.  Cardio: Irregularly irregular. Extremities warm to touch with thready pulses without notable edema. <2 sec refill on feet.  Abdomen: Soft, + BS.  Non tender, no guarding, rebound, hernias, masses. Lymphatics: Non tender without lymphadenopathy.  Musculoskeletal: Full ROM, symmetrial strength, slow gait Skin: Warm, dry without rashes, lesions, ecchymosis.  Neuro: Cranial nerves intact. No cerebellar symptoms.  Psych: Awake and oriented X 3, normal affect, Insight and Judgment appropriate.    Izora Ribas, NP 9:31 AM [facility]

## 2017-07-28 ENCOUNTER — Other Ambulatory Visit: Payer: Self-pay | Admitting: Internal Medicine

## 2017-07-29 ENCOUNTER — Ambulatory Visit: Payer: Medicare Other | Admitting: Adult Health

## 2017-07-29 ENCOUNTER — Encounter: Payer: Self-pay | Admitting: Adult Health

## 2017-07-29 VITALS — BP 122/68 | HR 82 | Temp 97.9°F | Ht 70.0 in | Wt 186.0 lb

## 2017-07-29 DIAGNOSIS — I480 Paroxysmal atrial fibrillation: Secondary | ICD-10-CM | POA: Diagnosis not present

## 2017-07-29 DIAGNOSIS — E782 Mixed hyperlipidemia: Secondary | ICD-10-CM

## 2017-07-29 DIAGNOSIS — Z6825 Body mass index (BMI) 25.0-25.9, adult: Secondary | ICD-10-CM | POA: Diagnosis not present

## 2017-07-29 DIAGNOSIS — J449 Chronic obstructive pulmonary disease, unspecified: Secondary | ICD-10-CM

## 2017-07-29 DIAGNOSIS — I10 Essential (primary) hypertension: Secondary | ICD-10-CM | POA: Diagnosis not present

## 2017-07-29 DIAGNOSIS — E559 Vitamin D deficiency, unspecified: Secondary | ICD-10-CM

## 2017-07-29 DIAGNOSIS — E1121 Type 2 diabetes mellitus with diabetic nephropathy: Secondary | ICD-10-CM | POA: Diagnosis not present

## 2017-07-29 DIAGNOSIS — Z79899 Other long term (current) drug therapy: Secondary | ICD-10-CM

## 2017-07-29 NOTE — Patient Instructions (Signed)
Aim for 7+ servings of fruits and vegetables daily  80+ fluid ounces of water or unsweet tea for healthy kidneys  Limit alcohol intake  Limit animal fats in diet for cholesterol and heart health - choose grass fed whenever available  Aim for low stress - take time to unwind and care for your mental health  Aim for 150 min of moderate intensity exercise weekly for heart health, and weights twice weekly for bone health  Aim for 7-9 hours of sleep daily      When it comes to diets, agreement about the perfect plan isn't easy to find, even among the experts. Experts at the [employer] developed an idea known as the Healthy Eating Plate. Just imagine a plate divided into logical, healthy portions.  The emphasis is on diet quality:  Load up on vegetables and fruits - one-half of your plate: Aim for color and variety, and remember that potatoes don't count.  Go for whole grains - one-quarter of your plate: Whole wheat, barley, wheat berries, quinoa, oats, brown rice, and foods made with them. If you want pasta, go with whole wheat pasta.  Protein power - one-quarter of your plate: Fish, chicken, beans, and nuts are all healthy, versatile protein sources. Limit red meat.  The diet, however, does go beyond the plate, offering a few other suggestions.  Use healthy plant oils, such as olive, canola, soy, corn, sunflower and peanut. Check the labels, and avoid partially hydrogenated oil, which have unhealthy trans fats.  If you're thirsty, drink water. Coffee and tea are good in moderation, but skip sugary drinks and limit milk and dairy products to one or two daily servings.  The type of carbohydrate in the diet is more important than the amount. Some sources of carbohydrates, such as vegetables, fruits, whole grains, and beans-are healthier than others.  Finally, stay active.  

## 2017-07-30 ENCOUNTER — Other Ambulatory Visit: Payer: Self-pay | Admitting: Adult Health

## 2017-07-30 DIAGNOSIS — D649 Anemia, unspecified: Secondary | ICD-10-CM

## 2017-07-30 LAB — CBC WITH DIFFERENTIAL/PLATELET
BASOS ABS: 148 {cells}/uL (ref 0–200)
Basophils Relative: 0.8 %
EOS ABS: 1906 {cells}/uL — AB (ref 15–500)
Eosinophils Relative: 10.3 %
HEMATOCRIT: 37.4 % — AB (ref 38.5–50.0)
HEMOGLOBIN: 13 g/dL — AB (ref 13.2–17.1)
LYMPHS ABS: 2479 {cells}/uL (ref 850–3900)
MCH: 28.7 pg (ref 27.0–33.0)
MCHC: 34.8 g/dL (ref 32.0–36.0)
MCV: 82.6 fL (ref 80.0–100.0)
MPV: 10.9 fL (ref 7.5–12.5)
Monocytes Relative: 15.8 %
NEUTROS ABS: 11045 {cells}/uL — AB (ref 1500–7800)
NEUTROS PCT: 59.7 %
Platelets: 417 10*3/uL — ABNORMAL HIGH (ref 140–400)
RBC: 4.53 10*6/uL (ref 4.20–5.80)
RDW: 14.1 % (ref 11.0–15.0)
Total Lymphocyte: 13.4 %
WBC: 18.5 10*3/uL — ABNORMAL HIGH (ref 3.8–10.8)
WBCMIX: 2923 {cells}/uL — AB (ref 200–950)

## 2017-07-30 LAB — BASIC METABOLIC PANEL WITH GFR
BUN/Creatinine Ratio: 18 (calc) (ref 6–22)
BUN: 12 mg/dL (ref 7–25)
CO2: 31 mmol/L (ref 20–32)
CREATININE: 0.67 mg/dL — AB (ref 0.70–1.18)
Calcium: 9.6 mg/dL (ref 8.6–10.3)
Chloride: 104 mmol/L (ref 98–110)
GFR, EST NON AFRICAN AMERICAN: 91 mL/min/{1.73_m2} (ref >=60)
GFR, Est African American: 106 mL/min/{1.73_m2} (ref >=60)
Glucose, Bld: 115 mg/dL — ABNORMAL HIGH (ref 65–99)
POTASSIUM: 4.2 mmol/L (ref 3.5–5.3)
SODIUM: 141 mmol/L (ref 135–146)

## 2017-07-30 LAB — HEPATIC FUNCTION PANEL
AG RATIO: 1.4 (calc) (ref 1.0–2.5)
ALT: 15 U/L (ref 9–46)
AST: 15 U/L (ref 10–35)
Albumin: 3.8 g/dL (ref 3.6–5.1)
Alkaline phosphatase (APISO): 74 U/L (ref 40–115)
BILIRUBIN DIRECT: 0.1 mg/dL (ref 0.0–0.2)
Globulin: 2.8 g/dL (calc) (ref 1.9–3.7)
Indirect Bilirubin: 0.4 mg/dL (calc) (ref 0.2–1.2)
Total Bilirubin: 0.5 mg/dL (ref 0.2–1.2)
Total Protein: 6.6 g/dL (ref 6.1–8.1)

## 2017-07-30 LAB — LIPID PANEL
CHOL/HDL RATIO: 3.8 (calc) (ref <5.0)
CHOLESTEROL: 117 mg/dL (ref <200)
HDL: 31 mg/dL — AB (ref >40)
LDL Cholesterol (Calc): 66 mg/dL (calc)
Non-HDL Cholesterol (Calc): 86 mg/dL (calc) (ref <130)
Triglycerides: 116 mg/dL (ref <150)

## 2017-07-30 LAB — TSH: TSH: 1.49 mIU/L (ref 0.40–4.50)

## 2017-07-30 LAB — HEMOGLOBIN A1C
Hgb A1c MFr Bld: 6.3 % of total Hgb — ABNORMAL HIGH (ref <5.7)
Mean Plasma Glucose: 134 (calc)
eAG (mmol/L): 7.4 (calc)

## 2017-07-30 LAB — MAGNESIUM: Magnesium: 1.2 mg/dL — ABNORMAL LOW (ref 1.5–2.5)

## 2017-08-12 ENCOUNTER — Other Ambulatory Visit: Payer: Medicare Other

## 2017-08-12 DIAGNOSIS — D649 Anemia, unspecified: Secondary | ICD-10-CM

## 2017-08-12 LAB — CBC WITH DIFFERENTIAL/PLATELET
BASOS ABS: 183 {cells}/uL (ref 0–200)
Basophils Relative: 1 %
EOS ABS: 2068 {cells}/uL — AB (ref 15–500)
Eosinophils Relative: 11.3 %
HEMATOCRIT: 38.5 % (ref 38.5–50.0)
HEMOGLOBIN: 13.6 g/dL (ref 13.2–17.1)
LYMPHS ABS: 2159 {cells}/uL (ref 850–3900)
MCH: 28.9 pg (ref 27.0–33.0)
MCHC: 35.3 g/dL (ref 32.0–36.0)
MCV: 81.9 fL (ref 80.0–100.0)
MPV: 11.3 fL (ref 7.5–12.5)
Monocytes Relative: 15.6 %
NEUTROS ABS: 11035 {cells}/uL — AB (ref 1500–7800)
NEUTROS PCT: 60.3 %
Platelets: 427 10*3/uL — ABNORMAL HIGH (ref 140–400)
RBC: 4.7 10*6/uL (ref 4.20–5.80)
RDW: 13.8 % (ref 11.0–15.0)
Total Lymphocyte: 11.8 %
WBC: 18.3 10*3/uL — ABNORMAL HIGH (ref 3.8–10.8)
WBCMIX: 2855 {cells}/uL — AB (ref 200–950)

## 2017-09-18 ENCOUNTER — Inpatient Hospital Stay (HOSPITAL_COMMUNITY): Payer: Medicare Other

## 2017-09-18 ENCOUNTER — Emergency Department (HOSPITAL_COMMUNITY): Payer: Medicare Other

## 2017-09-18 ENCOUNTER — Encounter (HOSPITAL_COMMUNITY): Payer: Self-pay | Admitting: Emergency Medicine

## 2017-09-18 ENCOUNTER — Other Ambulatory Visit: Payer: Self-pay

## 2017-09-18 ENCOUNTER — Inpatient Hospital Stay (HOSPITAL_COMMUNITY)
Admission: EM | Admit: 2017-09-18 | Discharge: 2017-09-25 | DRG: 871 | Disposition: E | Payer: Medicare Other | Attending: Internal Medicine | Admitting: Internal Medicine

## 2017-09-18 DIAGNOSIS — Z7189 Other specified counseling: Secondary | ICD-10-CM

## 2017-09-18 DIAGNOSIS — G9349 Other encephalopathy: Secondary | ICD-10-CM | POA: Diagnosis present

## 2017-09-18 DIAGNOSIS — F17220 Nicotine dependence, chewing tobacco, uncomplicated: Secondary | ICD-10-CM | POA: Diagnosis present

## 2017-09-18 DIAGNOSIS — Z7984 Long term (current) use of oral hypoglycemic drugs: Secondary | ICD-10-CM | POA: Diagnosis not present

## 2017-09-18 DIAGNOSIS — Z9081 Acquired absence of spleen: Secondary | ICD-10-CM | POA: Diagnosis not present

## 2017-09-18 DIAGNOSIS — J189 Pneumonia, unspecified organism: Secondary | ICD-10-CM

## 2017-09-18 DIAGNOSIS — Z7901 Long term (current) use of anticoagulants: Secondary | ICD-10-CM

## 2017-09-18 DIAGNOSIS — Z66 Do not resuscitate: Secondary | ICD-10-CM | POA: Diagnosis present

## 2017-09-18 DIAGNOSIS — Z87891 Personal history of nicotine dependence: Secondary | ICD-10-CM

## 2017-09-18 DIAGNOSIS — N183 Chronic kidney disease, stage 3 (moderate): Secondary | ICD-10-CM | POA: Diagnosis present

## 2017-09-18 DIAGNOSIS — I4891 Unspecified atrial fibrillation: Secondary | ICD-10-CM | POA: Diagnosis present

## 2017-09-18 DIAGNOSIS — E7800 Pure hypercholesterolemia, unspecified: Secondary | ICD-10-CM | POA: Diagnosis present

## 2017-09-18 DIAGNOSIS — Z515 Encounter for palliative care: Secondary | ICD-10-CM | POA: Diagnosis not present

## 2017-09-18 DIAGNOSIS — E872 Acidosis: Secondary | ICD-10-CM | POA: Diagnosis present

## 2017-09-18 DIAGNOSIS — E1122 Type 2 diabetes mellitus with diabetic chronic kidney disease: Secondary | ICD-10-CM | POA: Diagnosis present

## 2017-09-18 DIAGNOSIS — I480 Paroxysmal atrial fibrillation: Secondary | ICD-10-CM | POA: Diagnosis not present

## 2017-09-18 DIAGNOSIS — J439 Emphysema, unspecified: Secondary | ICD-10-CM | POA: Diagnosis present

## 2017-09-18 DIAGNOSIS — R0602 Shortness of breath: Secondary | ICD-10-CM

## 2017-09-18 DIAGNOSIS — I129 Hypertensive chronic kidney disease with stage 1 through stage 4 chronic kidney disease, or unspecified chronic kidney disease: Secondary | ICD-10-CM | POA: Diagnosis present

## 2017-09-18 DIAGNOSIS — Z888 Allergy status to other drugs, medicaments and biological substances status: Secondary | ICD-10-CM

## 2017-09-18 DIAGNOSIS — J9602 Acute respiratory failure with hypercapnia: Secondary | ICD-10-CM | POA: Diagnosis present

## 2017-09-18 DIAGNOSIS — Z9049 Acquired absence of other specified parts of digestive tract: Secondary | ICD-10-CM

## 2017-09-18 DIAGNOSIS — Z91040 Latex allergy status: Secondary | ICD-10-CM | POA: Diagnosis not present

## 2017-09-18 DIAGNOSIS — K7200 Acute and subacute hepatic failure without coma: Secondary | ICD-10-CM | POA: Diagnosis not present

## 2017-09-18 DIAGNOSIS — H3530 Unspecified macular degeneration: Secondary | ICD-10-CM | POA: Diagnosis present

## 2017-09-18 DIAGNOSIS — R918 Other nonspecific abnormal finding of lung field: Secondary | ICD-10-CM | POA: Diagnosis not present

## 2017-09-18 DIAGNOSIS — R6520 Severe sepsis without septic shock: Secondary | ICD-10-CM | POA: Diagnosis not present

## 2017-09-18 DIAGNOSIS — E782 Mixed hyperlipidemia: Secondary | ICD-10-CM | POA: Diagnosis present

## 2017-09-18 DIAGNOSIS — Z8249 Family history of ischemic heart disease and other diseases of the circulatory system: Secondary | ICD-10-CM | POA: Diagnosis not present

## 2017-09-18 DIAGNOSIS — E785 Hyperlipidemia, unspecified: Secondary | ICD-10-CM | POA: Diagnosis present

## 2017-09-18 DIAGNOSIS — A419 Sepsis, unspecified organism: Principal | ICD-10-CM | POA: Diagnosis present

## 2017-09-18 DIAGNOSIS — J181 Lobar pneumonia, unspecified organism: Secondary | ICD-10-CM | POA: Diagnosis not present

## 2017-09-18 DIAGNOSIS — J9601 Acute respiratory failure with hypoxia: Secondary | ICD-10-CM | POA: Diagnosis present

## 2017-09-18 DIAGNOSIS — Z881 Allergy status to other antibiotic agents status: Secondary | ICD-10-CM | POA: Diagnosis not present

## 2017-09-18 DIAGNOSIS — H548 Legal blindness, as defined in USA: Secondary | ICD-10-CM | POA: Diagnosis present

## 2017-09-18 DIAGNOSIS — J449 Chronic obstructive pulmonary disease, unspecified: Secondary | ICD-10-CM | POA: Diagnosis not present

## 2017-09-18 DIAGNOSIS — J9600 Acute respiratory failure, unspecified whether with hypoxia or hypercapnia: Secondary | ICD-10-CM | POA: Diagnosis present

## 2017-09-18 DIAGNOSIS — E1129 Type 2 diabetes mellitus with other diabetic kidney complication: Secondary | ICD-10-CM | POA: Diagnosis present

## 2017-09-18 DIAGNOSIS — I10 Essential (primary) hypertension: Secondary | ICD-10-CM | POA: Diagnosis present

## 2017-09-18 LAB — I-STAT VENOUS BLOOD GAS, ED
Acid-base deficit: 1 mmol/L (ref 0.0–2.0)
BICARBONATE: 29.3 mmol/L — AB (ref 20.0–28.0)
O2 Saturation: 31 %
PCO2 VEN: 72 mmHg — AB (ref 44.0–60.0)
PH VEN: 7.217 — AB (ref 7.250–7.430)
PO2 VEN: 24 mmHg — AB (ref 32.0–45.0)
TCO2: 31 mmol/L (ref 22–32)

## 2017-09-18 LAB — PROTIME-INR
INR: 1.12
PROTHROMBIN TIME: 14.3 s (ref 11.4–15.2)

## 2017-09-18 LAB — RESPIRATORY PANEL BY PCR
Adenovirus: NOT DETECTED
BORDETELLA PERTUSSIS-RVPCR: NOT DETECTED
CORONAVIRUS 229E-RVPPCR: NOT DETECTED
Chlamydophila pneumoniae: NOT DETECTED
Coronavirus HKU1: NOT DETECTED
Coronavirus NL63: NOT DETECTED
Coronavirus OC43: NOT DETECTED
INFLUENZA A-RVPPCR: NOT DETECTED
INFLUENZA B-RVPPCR: NOT DETECTED
METAPNEUMOVIRUS-RVPPCR: NOT DETECTED
MYCOPLASMA PNEUMONIAE-RVPPCR: NOT DETECTED
PARAINFLUENZA VIRUS 2-RVPPCR: NOT DETECTED
PARAINFLUENZA VIRUS 4-RVPPCR: NOT DETECTED
Parainfluenza Virus 1: NOT DETECTED
Parainfluenza Virus 3: NOT DETECTED
RESPIRATORY SYNCYTIAL VIRUS-RVPPCR: NOT DETECTED
Rhinovirus / Enterovirus: NOT DETECTED

## 2017-09-18 LAB — I-STAT CG4 LACTIC ACID, ED
Lactic Acid, Venous: 3.8 mmol/L (ref 0.5–1.9)
Lactic Acid, Venous: 2.65 mmol/L (ref 0.5–1.9)

## 2017-09-18 LAB — COMPREHENSIVE METABOLIC PANEL
ALT: 25 U/L (ref 17–63)
ANION GAP: 14 (ref 5–15)
AST: 23 U/L (ref 15–41)
Albumin: 4 g/dL (ref 3.5–5.0)
Alkaline Phosphatase: 80 U/L (ref 38–126)
BILIRUBIN TOTAL: 1.3 mg/dL — AB (ref 0.3–1.2)
BUN: 31 mg/dL — ABNORMAL HIGH (ref 6–20)
CHLORIDE: 98 mmol/L — AB (ref 101–111)
CO2: 26 mmol/L (ref 22–32)
Calcium: 10.5 mg/dL — ABNORMAL HIGH (ref 8.9–10.3)
Creatinine, Ser: 1.28 mg/dL — ABNORMAL HIGH (ref 0.61–1.24)
GFR calc non Af Amer: 52 mL/min — ABNORMAL LOW (ref >60)
GFR, EST AFRICAN AMERICAN: 60 mL/min — AB (ref >60)
Glucose, Bld: 181 mg/dL — ABNORMAL HIGH (ref 65–99)
POTASSIUM: 5.1 mmol/L (ref 3.5–5.1)
SODIUM: 138 mmol/L (ref 135–145)
TOTAL PROTEIN: 8.4 g/dL — AB (ref 6.5–8.1)

## 2017-09-18 LAB — I-STAT CHEM 8, ED
BUN: 45 mg/dL — ABNORMAL HIGH (ref 6–20)
CALCIUM ION: 1.21 mmol/L (ref 1.15–1.40)
CREATININE: 1 mg/dL (ref 0.61–1.24)
Chloride: 101 mmol/L (ref 101–111)
GLUCOSE: 176 mg/dL — AB (ref 65–99)
HCT: 50 % (ref 39.0–52.0)
HEMOGLOBIN: 17 g/dL (ref 13.0–17.0)
POTASSIUM: 5.6 mmol/L — AB (ref 3.5–5.1)
Sodium: 137 mmol/L (ref 135–145)
TCO2: 28 mmol/L (ref 22–32)

## 2017-09-18 LAB — I-STAT TROPONIN, ED: TROPONIN I, POC: 0 ng/mL (ref 0.00–0.08)

## 2017-09-18 LAB — DIFFERENTIAL
Basophils Absolute: 0 10*3/uL (ref 0.0–0.1)
Basophils Relative: 0 %
EOS ABS: 0 10*3/uL (ref 0.0–0.7)
Eosinophils Relative: 0 %
LYMPHS ABS: 1.6 10*3/uL (ref 0.7–4.0)
LYMPHS PCT: 4 %
MONO ABS: 4.9 10*3/uL — AB (ref 0.1–1.0)
Monocytes Relative: 12 %
NEUTROS ABS: 34.4 10*3/uL — AB (ref 1.7–7.7)
NEUTROS PCT: 84 %
WBC Morphology: INCREASED

## 2017-09-18 LAB — CBC
HCT: 49.9 % (ref 39.0–52.0)
Hemoglobin: 15.9 g/dL (ref 13.0–17.0)
MCH: 27.6 pg (ref 26.0–34.0)
MCHC: 31.9 g/dL (ref 30.0–36.0)
MCV: 86.6 fL (ref 78.0–100.0)
PLATELETS: 456 10*3/uL — AB (ref 150–400)
RBC: 5.76 MIL/uL (ref 4.22–5.81)
RDW: 13.2 % (ref 11.5–15.5)
WBC: 40.9 10*3/uL — ABNORMAL HIGH (ref 4.0–10.5)

## 2017-09-18 LAB — GLUCOSE, CAPILLARY: Glucose-Capillary: 175 mg/dL — ABNORMAL HIGH (ref 65–99)

## 2017-09-18 LAB — CBG MONITORING, ED: GLUCOSE-CAPILLARY: 174 mg/dL — AB (ref 65–99)

## 2017-09-18 LAB — STREP PNEUMONIAE URINARY ANTIGEN: Strep Pneumo Urinary Antigen: NEGATIVE

## 2017-09-18 LAB — EXPECTORATED SPUTUM ASSESSMENT W REFEX TO RESP CULTURE

## 2017-09-18 LAB — EXPECTORATED SPUTUM ASSESSMENT W GRAM STAIN, RFLX TO RESP C

## 2017-09-18 LAB — APTT: aPTT: 30 seconds (ref 24–36)

## 2017-09-18 MED ORDER — METFORMIN HCL ER 500 MG PO TB24
1000.0000 mg | ORAL_TABLET | Freq: Every day | ORAL | Status: DC
  Filled 2017-09-18: qty 2

## 2017-09-18 MED ORDER — BIOTENE DRY MOUTH MT LIQD: 15.0000 mL | OROMUCOSAL | Status: DC | PRN

## 2017-09-18 MED ORDER — ACETAMINOPHEN 325 MG PO TABS
650.0000 mg | ORAL_TABLET | Freq: Once | ORAL | Status: AC
  Administered 2017-09-18: 650 mg via ORAL
  Filled 2017-09-18: qty 2

## 2017-09-18 MED ORDER — ONDANSETRON HCL 4 MG/2ML IJ SOLN: 4.0000 mg | Freq: Four times a day (QID) | INTRAMUSCULAR | Status: DC | PRN

## 2017-09-18 MED ORDER — SODIUM CHLORIDE 0.9 % IV BOLUS
2500.0000 mL | Freq: Once | INTRAVENOUS | Status: AC
  Administered 2017-09-18: 2500 mL via INTRAVENOUS

## 2017-09-18 MED ORDER — TIOTROPIUM BROMIDE MONOHYDRATE 18 MCG IN CAPS
18.0000 ug | ORAL_CAPSULE | Freq: Every day | RESPIRATORY_TRACT | Status: DC
  Filled 2017-09-18: qty 5

## 2017-09-18 MED ORDER — LORAZEPAM 2 MG/ML IJ SOLN
1.0000 mg | INTRAMUSCULAR | Status: DC | PRN
  Administered 2017-09-19 – 2017-09-20 (×2): 1 mg via INTRAVENOUS
  Filled 2017-09-18 (×2): qty 1

## 2017-09-18 MED ORDER — FUROSEMIDE 10 MG/ML IJ SOLN
40.0000 mg | Freq: Once | INTRAMUSCULAR | Status: AC
  Administered 2017-09-18: 40 mg via INTRAVENOUS

## 2017-09-18 MED ORDER — SODIUM CHLORIDE 0.9 % IV BOLUS: 1000.0000 mL | Freq: Once | INTRAVENOUS | Status: DC

## 2017-09-18 MED ORDER — TRAMADOL HCL 50 MG PO TABS
100.0000 mg | ORAL_TABLET | Freq: Four times a day (QID) | ORAL | Status: DC | PRN
  Filled 2017-09-18: qty 2

## 2017-09-18 MED ORDER — SODIUM CHLORIDE 0.9 % IV SOLN
500.0000 mg | INTRAVENOUS | Status: DC
  Administered 2017-09-19 – 2017-09-21 (×3): 500 mg via INTRAVENOUS
  Filled 2017-09-18 (×3): qty 500

## 2017-09-18 MED ORDER — INSULIN ASPART 100 UNIT/ML ~~LOC~~ SOLN
0.0000 [IU] | Freq: Three times a day (TID) | SUBCUTANEOUS | Status: DC
  Administered 2017-09-19 (×2): 3 [IU] via SUBCUTANEOUS
  Administered 2017-09-20: 2 [IU] via SUBCUTANEOUS
  Administered 2017-09-21: 3 [IU] via SUBCUTANEOUS

## 2017-09-18 MED ORDER — OMEGA-3-ACID ETHYL ESTERS 1 G PO CAPS
1.0000 g | ORAL_CAPSULE | Freq: Every day | ORAL | Status: DC
Start: 2017-09-18 — End: 2017-09-21
  Administered 2017-09-19: 1 g via ORAL
  Filled 2017-09-18 (×2): qty 1

## 2017-09-18 MED ORDER — SODIUM CHLORIDE 0.9 % IV SOLN
1.0000 g | INTRAVENOUS | Status: DC
  Administered 2017-09-18 – 2017-09-21 (×4): 1 g via INTRAVENOUS
  Filled 2017-09-18 (×3): qty 10

## 2017-09-18 MED ORDER — HALOPERIDOL LACTATE 2 MG/ML PO CONC
0.5000 mg | ORAL | Status: DC | PRN
  Filled 2017-09-18: qty 0.3

## 2017-09-18 MED ORDER — ADULT MULTIVITAMIN W/MINERALS CH
1.0000 | ORAL_TABLET | Freq: Every day | ORAL | Status: DC
  Administered 2017-09-19: 1 via ORAL
  Filled 2017-09-18 (×2): qty 1

## 2017-09-18 MED ORDER — GLYCOPYRROLATE 0.2 MG/ML IJ SOLN: 0.2000 mg | INTRAMUSCULAR | Status: DC | PRN

## 2017-09-18 MED ORDER — SODIUM CHLORIDE 0.9 % IV SOLN
250.0000 mL | INTRAVENOUS | Status: DC | PRN
  Administered 2017-09-18: 250 mL via INTRAVENOUS

## 2017-09-18 MED ORDER — IPRATROPIUM-ALBUTEROL 0.5-2.5 (3) MG/3ML IN SOLN: 3.0000 mL | Freq: Four times a day (QID) | RESPIRATORY_TRACT | Status: DC | PRN

## 2017-09-18 MED ORDER — SODIUM CHLORIDE 0.9 % IV SOLN
500.0000 mg | Freq: Once | INTRAVENOUS | Status: AC
  Administered 2017-09-18: 500 mg via INTRAVENOUS
  Filled 2017-09-18: qty 500

## 2017-09-18 MED ORDER — LORAZEPAM 2 MG/ML PO CONC: 1.0000 mg | ORAL | Status: DC | PRN

## 2017-09-18 MED ORDER — ACETAMINOPHEN 650 MG RE SUPP: 650.0000 mg | Freq: Four times a day (QID) | RECTAL | Status: DC | PRN

## 2017-09-18 MED ORDER — MOMETASONE FURO-FORMOTEROL FUM 200-5 MCG/ACT IN AERO
2.0000 | INHALATION_SPRAY | Freq: Two times a day (BID) | RESPIRATORY_TRACT | Status: DC
Start: 2017-09-18 — End: 2017-09-21
  Filled 2017-09-18: qty 8.8

## 2017-09-18 MED ORDER — GUAIFENESIN ER 600 MG PO TB12
1200.0000 mg | ORAL_TABLET | Freq: Two times a day (BID) | ORAL | Status: DC
  Administered 2017-09-19 (×2): 1200 mg via ORAL
  Filled 2017-09-18 (×5): qty 2

## 2017-09-18 MED ORDER — LORAZEPAM 1 MG PO TABS: 1.0000 mg | ORAL_TABLET | ORAL | Status: DC | PRN

## 2017-09-18 MED ORDER — POLYVINYL ALCOHOL 1.4 % OP SOLN
1.0000 [drp] | Freq: Four times a day (QID) | OPHTHALMIC | Status: DC | PRN
  Filled 2017-09-18: qty 15

## 2017-09-18 MED ORDER — LACTATED RINGERS IV SOLN
INTRAVENOUS | Status: DC
  Administered 2017-09-18: 23:00:00 via INTRAVENOUS
  Filled 2017-09-18 (×3): qty 1000

## 2017-09-18 MED ORDER — ACETAMINOPHEN 325 MG PO TABS
650.0000 mg | ORAL_TABLET | Freq: Three times a day (TID) | ORAL | Status: DC
  Administered 2017-09-19 – 2017-09-20 (×3): 650 mg via ORAL
  Filled 2017-09-18 (×3): qty 2

## 2017-09-18 MED ORDER — DIPHENHYDRAMINE HCL 50 MG/ML IJ SOLN: 12.5000 mg | INTRAMUSCULAR | Status: DC | PRN

## 2017-09-18 MED ORDER — IOPAMIDOL (ISOVUE-370) INJECTION 76%
INTRAVENOUS | Status: AC
  Filled 2017-09-18: qty 100

## 2017-09-18 MED ORDER — SODIUM CHLORIDE 0.9% FLUSH
3.0000 mL | Freq: Two times a day (BID) | INTRAVENOUS | Status: DC
  Administered 2017-09-18 – 2017-09-22 (×5): 3 mL via INTRAVENOUS

## 2017-09-18 MED ORDER — DILTIAZEM HCL 25 MG/5ML IV SOLN
10.0000 mg | Freq: Once | INTRAVENOUS | Status: AC
  Administered 2017-09-18: 10 mg via INTRAVENOUS
  Filled 2017-09-18: qty 5

## 2017-09-18 MED ORDER — TRIAMCINOLONE ACETONIDE 0.1 % EX OINT
1.0000 "application " | TOPICAL_OINTMENT | Freq: Two times a day (BID) | CUTANEOUS | Status: DC
  Administered 2017-09-19 – 2017-09-20 (×3): 1 via TOPICAL
  Filled 2017-09-18: qty 15

## 2017-09-18 MED ORDER — APIXABAN 2.5 MG PO TABS
2.5000 mg | ORAL_TABLET | Freq: Two times a day (BID) | ORAL | Status: DC
  Administered 2017-09-19 (×2): 2.5 mg via ORAL
  Filled 2017-09-18 (×3): qty 1

## 2017-09-18 MED ORDER — GLYCOPYRROLATE 0.2 MG/ML IJ SOLN
0.2000 mg | INTRAMUSCULAR | Status: DC | PRN
  Administered 2017-09-19 – 2017-09-21 (×3): 0.2 mg via INTRAVENOUS
  Filled 2017-09-18 (×4): qty 1

## 2017-09-18 MED ORDER — MORPHINE SULFATE (CONCENTRATE) 10 MG/0.5ML PO SOLN: 5.0000 mg | ORAL | Status: DC | PRN

## 2017-09-18 MED ORDER — SODIUM CHLORIDE 0.9 % IV SOLN
12.5000 mg | Freq: Four times a day (QID) | INTRAVENOUS | Status: DC | PRN
  Filled 2017-09-18: qty 0.5

## 2017-09-18 MED ORDER — FLAXSEED OIL 1000 MG PO CAPS: 1000.0000 mg | ORAL_CAPSULE | Freq: Every day | ORAL | Status: DC

## 2017-09-18 MED ORDER — CEFTRIAXONE SODIUM 1 G IJ SOLR
1.0000 g | Freq: Once | INTRAMUSCULAR | Status: DC
  Filled 2017-09-18: qty 10

## 2017-09-18 MED ORDER — ATENOLOL 25 MG PO TABS
50.0000 mg | ORAL_TABLET | Freq: Every day | ORAL | Status: DC
  Filled 2017-09-18 (×2): qty 2

## 2017-09-18 MED ORDER — HYDROCOD POLST-CPM POLST ER 10-8 MG/5ML PO SUER: 5.0000 mL | Freq: Two times a day (BID) | ORAL | Status: DC | PRN

## 2017-09-18 MED ORDER — SODIUM CHLORIDE 0.9 % IV SOLN: 250.0000 mL | INTRAVENOUS | Status: DC | PRN

## 2017-09-18 MED ORDER — HALOPERIDOL LACTATE 5 MG/ML IJ SOLN
0.5000 mg | INTRAMUSCULAR | Status: DC | PRN
  Administered 2017-09-20: 0.5 mg via INTRAVENOUS
  Filled 2017-09-18: qty 1

## 2017-09-18 MED ORDER — CHLORHEXIDINE GLUCONATE 0.12 % MT SOLN
15.0000 mL | Freq: Two times a day (BID) | OROMUCOSAL | Status: DC
  Administered 2017-09-18 – 2017-09-20 (×3): 15 mL via OROMUCOSAL
  Filled 2017-09-18 (×3): qty 15

## 2017-09-18 MED ORDER — ATORVASTATIN CALCIUM 40 MG PO TABS
40.0000 mg | ORAL_TABLET | Freq: Every day | ORAL | Status: DC
  Administered 2017-09-19: 40 mg via ORAL
  Filled 2017-09-18: qty 1

## 2017-09-18 MED ORDER — FUROSEMIDE 10 MG/ML IJ SOLN
INTRAMUSCULAR | Status: AC
  Filled 2017-09-18: qty 4

## 2017-09-18 MED ORDER — ONDANSETRON 4 MG PO TBDP: 4.0000 mg | ORAL_TABLET | Freq: Four times a day (QID) | ORAL | Status: DC | PRN

## 2017-09-18 MED ORDER — DILTIAZEM HCL 25 MG/5ML IV SOLN
5.0000 mg | Freq: Once | INTRAVENOUS | Status: AC
  Administered 2017-09-18: 5 mg via INTRAVENOUS

## 2017-09-18 MED ORDER — CLOBETASOL PROPIONATE 0.05 % EX OINT
TOPICAL_OINTMENT | Freq: Two times a day (BID) | CUTANEOUS | Status: DC
  Administered 2017-09-19 – 2017-09-20 (×3): via TOPICAL
  Filled 2017-09-18 (×2): qty 15

## 2017-09-18 MED ORDER — HALOPERIDOL 0.5 MG PO TABS: 0.5000 mg | ORAL_TABLET | ORAL | Status: DC | PRN

## 2017-09-18 MED ORDER — MORPHINE SULFATE (PF) 4 MG/ML IV SOLN: 2.0000 mg | INTRAVENOUS | Status: DC | PRN

## 2017-09-18 MED ORDER — SODIUM CHLORIDE 0.9% FLUSH: 3.0000 mL | INTRAVENOUS | Status: DC | PRN

## 2017-09-18 MED ORDER — ACETAMINOPHEN 325 MG PO TABS: 650.0000 mg | ORAL_TABLET | Freq: Four times a day (QID) | ORAL | Status: DC | PRN

## 2017-09-18 MED ORDER — GLYCOPYRROLATE 1 MG PO TABS
1.0000 mg | ORAL_TABLET | ORAL | Status: DC | PRN
  Filled 2017-09-18: qty 1

## 2017-09-18 NOTE — ED Notes (Signed)
History of macular degeneration and is legally blind. Unable to count fingers. No unilateral weakness. Oriented but daughter states patient is lower to respond than normal. Speech is lightly slurred beyond baseline. Does not wear O2 at baseline, requires 4L O2 [place] to maintain saturation above 90%.

## 2017-09-18 NOTE — ED Notes (Signed)
MD responded immediately.  Stated to give pt 40 mg iv lasix, 5 mg if dilt and to increase O2 to 6L [place].

## 2017-09-18 NOTE — ED Provider Notes (Signed)
[facility] Provider Note   CSN: [id number] Arrival date & time: 09/13/2017  1221     History   Chief Complaint Chief Complaint  Patient presents with  . Weakness    HPI NOLON YELLIN is a 77 y.o. male hx of COPD, diabetes, A. Fib on eliquis, here presenting with shortness of breath, weakness.  [place]  He was noted to be very weak last night.  He states that it is hard for him to get out of bed to walk this morning.  Family initially called EMS, who checked him out and he didn't want to go to the ED at that time.  This morning, patient was noted to be more altered and confused.  He is also has a productive cough as well.  Family was not sure if he fell this morning as well.  Family drove back into town and came directly to the ED.  Patient was admitted several years ago for COPD exacerbation requiring Bipap. He was noted to be in rapid afib, hypoxic in triage.   The history is provided by the patient and the spouse.    Past Medical History:  Diagnosis Date  . COPD (chronic obstructive pulmonary disease) ([place])   . Diabetes mellitus   . Emphysema   . Hypercholesteremia   . Hypertension     Patient Active Problem List   Diagnosis Date Noted  . Hearing loss 11/13/2014  . Atrial fibrillation ([place]) 11/13/2014  . At high risk for falls 11/13/2014  . Encounter for Medicare annual wellness exam 11/07/2014  . Legal blindness -macular degeneration with retinopathy 08/06/2014  . Severe chronic obstructive pulmonary disease ([place]) 10/26/2013  . Pulmonary nodule, right upper lobe, 1cm, new since 2012 03/21/2013  . Essential hypertension 03/09/2013  . Vitamin D deficiency 03/09/2013  . Medication management 03/09/2013  . Melanoma of skin ([place]) 12/28/2012  . History of splenectomy-for hereditary spherocytosis 12/28/2012  . 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 Hyperlipidemia 10/03/2008  . BMI 25.0-25.9,adult  10/03/2008  . BPH/prostatism 10/03/2008    Past Surgical History:  Procedure Laterality Date  . BLADDER REPAIR    . CHOLECYSTECTOMY    . SPLENECTOMY, TOTAL          Home Medications    Prior to Admission medications   Medication Sig Start Date End Date Taking? Authorizing Provider  acetaminophen (TYLENOL 8 HOUR) 650 MG CR tablet Take 1,300 mg by mouth every 8 (eight) hours as needed for pain.   Yes [provider]  apixaban (ELIQUIS) 5 MG TABS tablet Take 2.5 mg by mouth 2 (two) times daily.  09/04/15  Yes [provider]  atorvastatin (LIPITOR) 40 MG tablet TAKE 1 TABLET BY MOUTH ONCE DAILY FOR CHOLESTEROL 07/28/17  Yes Unk Pinto, MD  clobetasol ointment (TEMOVATE) 0.05 % Apply to eczema rash 2-3 x/da 12/31/16  Yes Unk Pinto, MD  Flaxseed, Linseed, (FLAXSEED OIL) 1000 MG CAPS Take 1,000 mg by mouth daily.   Yes [provider]  guaiFENesin (MUCINEX) 600 MG 12 hr tablet Take 1,200 mg by mouth 2 (two) times daily.     Yes [provider]  metFORMIN (GLUCOPHAGE XR) 500 MG 24 hr tablet Take 2 tablets 2 / day for Diabetes 09/10/16 08/31/2017 Yes Unk Pinto, MD  Multiple Vitamin (MULTIVITAMIN WITH MINERALS) TABS tablet Take 1 tablet by mouth daily.   Yes [provider]  Omega-3 Fatty Acids (FISH OIL) 1000 MG CAPS  Take by mouth daily.   Yes [provider]  [place] Patient takes OTC folic acid tablet daily.   Yes [provider]  promethazine-dextromethorphan (PROMETHAZINE-DM) 6.25-15 MG/5ML syrup Take 1 to 2 tsp enery 4 hours if needed for cough 04/30/17  Yes Unk Pinto, MD  triamcinolone ointment (KENALOG) 0.1 % Apply 1 application topically 2 (two) times daily. 04/22/17  Yes Vicie Mutters, PA-C  atenolol (TENORMIN) 50 MG tablet Take 1 tablet daily for BP 09/10/16 09/10/17  Unk Pinto, MD  budesonide-formoterol [facility]) 160-4.5 MCG/ACT inhaler Inhale 2 puffs into the lungs 2 (two)  times daily. Patient not taking: Reported on 09/20/2017 09/05/15   Forcucci, Loma Sousa, PA-C  glucose blood (ONE TOUCH ULTRA TEST) test strip Check blood sugar 1 time a day. Dx: E11.9 04/14/17   Vicie Mutters, PA-C  tiotropium (SPIRIVA) 18 MCG inhalation capsule Place 1 capsule (18 mcg total) into inhaler and inhale daily. Patient not taking: Reported on 09/15/2017 03/26/16   Forcucci, Loma Sousa, PA-C  traMADol (ULTRAM) 50 MG tablet Take 2 tablets (100 mg total) by mouth every 6 (six) hours as needed. Patient not taking: Reported on 08/31/2017 03/26/16   Starlyn Skeans, PA-C    Family History Family History  Problem Relation Age of Onset  . Heart disease Mother   . Heart disease Father     Social History Social History   Tobacco Use  . Smoking status: Former Smoker    Packs/day: 3.00    Years: 45.00    Pack years: 135.00    Types: Cigarettes    Last attempt to quit: 04/27/1994    Years since quitting: 23.4  . Smokeless tobacco: Current User    Types: Chew  Substance Use Topics  . Alcohol use: No  . Drug use: No     Allergies   Crestor [rosuvastatin]; Latex; Neomycin-bacitracin zn-polymyx; and Ciprofloxacin   Review of Systems Review of Systems  Neurological: Positive for weakness.  All other systems reviewed and are negative.    Physical Exam Updated Vital Signs BP 116/73   Pulse (!) 125   Temp (!) 100.5 F (38.1 C) (Rectal)   Resp (!) 29   SpO2 97%   Physical Exam  Constitutional:  Tachypneic, altered, confused   HENT:  Head: Normocephalic and atraumatic.  Mouth/Throat: Oropharynx is clear and moist.  Eyes: Pupils are equal, round, and reactive to light. Conjunctivae and EOM are normal.  Neck: Normal range of motion. Neck supple.  Cardiovascular: Normal heart sounds.  Tachycardic, irregular   Pulmonary/Chest:  Tachypneic, crackles bilateral bases   Abdominal: Soft. Bowel sounds are normal. He exhibits no distension. There is no tenderness. There is no  guarding.  Musculoskeletal: Normal range of motion.  Neurological:  Confused, altered. No obvious facial droop, moving all extremities   Skin: Skin is warm.  Psychiatric:  Unable   Nursing note and vitals reviewed.    ED Treatments / Results  Labs (all labs ordered are listed, but only abnormal results are displayed) Labs Reviewed  CBC - Abnormal; Notable for the following components:      Result Value   WBC 40.9 (*)    Platelets 456 (*)    All other components within normal limits  COMPREHENSIVE METABOLIC PANEL - Abnormal; Notable for the following components:   Chloride 98 (*)    Glucose, Bld 181 (*)    BUN 31 (*)    Creatinine, Ser 1.28 (*)    Calcium 10.5 (*)    Total  Protein 8.4 (*)    Total Bilirubin 1.3 (*)    GFR calc non Af Amer 52 (*)    GFR calc Af Amer 60 (*)    All other components within normal limits  CBG MONITORING, ED - Abnormal; Notable for the following components:   Glucose-Capillary 174 (*)    All other components within normal limits  I-STAT CG4 LACTIC ACID, ED - Abnormal; Notable for the following components:   Lactic Acid, Venous 3.80 (*)    All other components within normal limits  I-STAT CHEM 8, ED - Abnormal; Notable for the following components:   Potassium 5.6 (*)    BUN 45 (*)    Glucose, Bld 176 (*)    All other components within normal limits  I-STAT VENOUS BLOOD GAS, ED - Abnormal; Notable for the following components:   pH, Ven 7.217 (*)    pCO2, Ven 72.0 (*)    pO2, Ven 24.0 (*)    Bicarbonate 29.3 (*)    All other components within normal limits  CULTURE, BLOOD (ROUTINE X 2)  CULTURE, BLOOD (ROUTINE X 2)  PROTIME-INR  APTT  DIFFERENTIAL  BLOOD GAS, VENOUS  I-STAT TROPONIN, ED    EKG EKG Interpretation  Date/Time:  Saturday Sep 18 2017 12:27:48 EDT Ventricular Rate:  120 PR Interval:  180 QRS Duration: 72 QT Interval:  262 QTC Calculation: 370 R Axis:   76 Text Interpretation:  Low voltage QRS Cannot rule out  Anterior infarct , age undetermined Abnormal ECG Since last tracing rate faster Confirmed by Wandra Arthurs ([id number]) on 09/17/2017 1:43:41 PM   Radiology Dg Chest 2 View  Result Date: 09/19/2017 CLINICAL DATA:  Short of breath.  Slightly slurred speech. EXAM: CHEST - 2 VIEW COMPARISON:  07/19/2013 FINDINGS: Focal masslike area of opacity is noted in the central right upper lobe projecting anterior to the superolateral right hilum. This measures approximately 4.5 cm on the AP view. Patchy consolidation is noted in the right lung base with milder opacity noted in the medial left lung base. Right lung base opacity corresponds the right middle and adjacent right lower lobes. Remainder of the lungs is clear. No pleural effusion. No pneumothorax. Cardiac silhouette is mildly enlarged. No mediastinal or left hilar masses or convincing adenopathy. Right hilum is partly obscured by the contiguous right upper lobe opacity. Skeletal structures are demineralized but grossly intact. IMPRESSION: 1. Masslike opacity in the central right upper lobe. This may reflect malignancy. It could reflect focal consolidation from pneumonia. Follow-up chest CT with contrast for further assessment is recommended. 2. Patchy right lung base opacity consistent with pneumonia, atelectasis or a combination. Milder left lung base opacity is likely atelectasis. 3. Mild cardiomegaly. Electronically Signed   By: Lajean Manes M.D.   On: 09/01/2017 13:18   Ct Head Wo Contrast  Result Date: 09/16/2017 CLINICAL DATA:  Slurred speech.  Fall. EXAM: CT HEAD WITHOUT CONTRAST CT CERVICAL SPINE WITHOUT CONTRAST TECHNIQUE: Multidetector CT imaging of the head and cervical spine was performed following the standard protocol without intravenous contrast. Multiplanar CT image reconstructions of the cervical spine were also generated. COMPARISON:  None. FINDINGS: CT HEAD FINDINGS Brain: Mild diffuse cortical atrophy is noted. Mild chronic ischemic white matter  disease is noted. No mass effect or midline shift is noted. Ventricular size is within normal limits. There is no evidence of mass lesion, hemorrhage or acute infarction. Vascular: No hyperdense vessel or unexpected calcification. Skull: Normal. Negative for fracture or focal lesion. Sinuses/Orbits:  No acute finding. Other: None. CT CERVICAL SPINE FINDINGS Alignment: Normal. Skull base and vertebrae: No acute fracture. No primary bone lesion or focal pathologic process. Soft tissues and spinal canal: No prevertebral fluid or swelling. No visible canal hematoma. Disc levels: Moderate degenerative disc disease is noted at C5-6. Severe degenerative disc disease is noted at C6-7 and C7-T1. Upper chest: Negative. Other: Degenerative changes are seen involving the left-sided posterior facet joints. IMPRESSION: Mild diffuse cortical atrophy. Mild chronic ischemic white matter disease. No acute intracranial abnormality seen. Multilevel degenerative disc disease. No acute abnormality seen in the cervical spine. Electronically Signed   By: Marijo Conception, M.D.   On: 09/17/2017 13:53   Ct Cervical Spine Wo Contrast  Result Date: 09/06/2017 CLINICAL DATA:  Slurred speech.  Fall. EXAM: CT HEAD WITHOUT CONTRAST CT CERVICAL SPINE WITHOUT CONTRAST TECHNIQUE: Multidetector CT imaging of the head and cervical spine was performed following the standard protocol without intravenous contrast. Multiplanar CT image reconstructions of the cervical spine were also generated. COMPARISON:  None. FINDINGS: CT HEAD FINDINGS Brain: Mild diffuse cortical atrophy is noted. Mild chronic ischemic white matter disease is noted. No mass effect or midline shift is noted. Ventricular size is within normal limits. There is no evidence of mass lesion, hemorrhage or acute infarction. Vascular: No hyperdense vessel or unexpected calcification. Skull: Normal. Negative for fracture or focal lesion. Sinuses/Orbits: No acute finding. Other: None. CT  CERVICAL SPINE FINDINGS Alignment: Normal. Skull base and vertebrae: No acute fracture. No primary bone lesion or focal pathologic process. Soft tissues and spinal canal: No prevertebral fluid or swelling. No visible canal hematoma. Disc levels: Moderate degenerative disc disease is noted at C5-6. Severe degenerative disc disease is noted at C6-7 and C7-T1. Upper chest: Negative. Other: Degenerative changes are seen involving the left-sided posterior facet joints. IMPRESSION: Mild diffuse cortical atrophy. Mild chronic ischemic white matter disease. No acute intracranial abnormality seen. Multilevel degenerative disc disease. No acute abnormality seen in the cervical spine. Electronically Signed   By: Marijo Conception, M.D.   On: 09/14/2017 13:53    Procedures Procedures (including critical care time)   CRITICAL CARE Performed by: Wandra Arthurs   Total critical care time: 43minutes  Critical care time was exclusive of separately billable procedures and treating other patients.  Critical care was necessary to treat or prevent imminent or life-threatening deterioration.  Critical care was time spent personally by me on the following activities: development of treatment plan with patient and/or surrogate as well as nursing, discussions with consultants, evaluation of patient's response to treatment, examination of patient, obtaining history from patient or surrogate, ordering and performing treatments and interventions, ordering and review of laboratory studies, ordering and review of radiographic studies, pulse oximetry and re-evaluation of patient's condition.   Medications Ordered in ED Medications  cefTRIAXone (ROCEPHIN) 1 g in sodium chloride 0.9 % 100 mL IVPB (has no administration in time range)  azithromycin (ZITHROMAX) 500 mg in sodium chloride 0.9 % 250 mL IVPB (has no administration in time range)  sodium chloride 0.9 % bolus 1,000 mL (has no administration in time range)  acetaminophen  (TYLENOL) tablet 650 mg (has no administration in time range)  diltiazem (CARDIZEM) injection 10 mg (10 mg Intravenous Given 09/04/2017 1354)     Initial Impression / Assessment and Plan / ED Course  I have reviewed the triage vital signs and the nursing notes.  Pertinent labs & imaging results that were available during my care of the patient  were reviewed by me and considered in my medical decision making (see chart for details).     GENERAL WEARING is a 77 y.o. male here with altered mental status, rapid A. fib, hypoxia. I am concerned for pneumonia vs rapid afib with RVR causing AMS vs stroke vs possible bleed. Will get labs, CT head, CXR, UA. Will give cardizem and reassess.   2:55 PM Labs showed WBC 40, lactate 4. CXR showed pneumonia. HR improved initially with cardizem but now tachycardic again. Given 1 L NS bolus, rocephin, azithromycin. Still tachypneic and confused. Likely has encephalopathy from pneumonia. CT head/neck unremarkable. Will admit to stepdown.   Final Clinical Impressions(s) / ED Diagnoses   Final diagnoses:  None    ED Discharge Orders    None       Drenda Freeze, MD 09/13/2017 1455

## 2017-09-18 NOTE — Progress Notes (Signed)
PHARMACIST - PHYSICIAN ORDER COMMUNICATION  CONCERNING: P&T Medication Policy on Herbal Medications  DESCRIPTION:  This patient's order for:  Flaxseed oil  has been noted.  This product(s) is classified as an "herbal" or natural product. Due to a lack of definitive safety studies or FDA approval, nonstandard manufacturing practices, plus the potential risk of unknown drug-drug interactions while on inpatient medications, the Pharmacy and Therapeutics Committee does not permit the use of "herbal" or natural products of this type within [facility].   ACTION TAKEN: The pharmacy department is unable to verify this order at this time and your patient has been informed of this safety policy. Please reevaluate patient's clinical condition at discharge and address if the herbal or natural product(s) should be resumed at that time.   Maclean Foister A. Levada Dy, PharmD, [place] Pager: [phone]

## 2017-09-18 NOTE — ED Notes (Addendum)
Pt refusing CT w/ contrast; he believes it will damage his kidneys; MD notified

## 2017-09-18 NOTE — Care Management (Signed)
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  She did request full DNR status.  He does not wish to be intubated he does not wish to be resuscitated.  I have entered palliative care orders.  She stated all of this in front of his daughter.

## 2017-09-18 NOTE — ED Notes (Addendum)
Pt noted to have increased work of breathing, hypotension, lethargy, and tachycardia; o2 increase to 4L; MD paged

## 2017-09-18 NOTE — ED Notes (Signed)
Pt assisted to standing position to urinate; pt's HR noted to elevate to a range between 130 and 180; pt assisted back to bed, HR back to 105

## 2017-09-18 NOTE — ED Notes (Signed)
Patient transported to CT 

## 2017-09-18 NOTE — ED Notes (Signed)
Meal tray ordered 

## 2017-09-18 NOTE — H&P (Signed)
History and Physical    [name] [id number]:[id number] DOB: 12/21/1940 DOA: 09/16/2017  PCP: Unk Pinto, MD   [place]  I have personally briefly reviewed patient's old medical records in [place]  Chief Complaint: [name]  HPI: [name] is a 77 y.o. male with medical history significant of type 2 diabetes, severe COPD, atrial fibrillation on Eliquis, presented with shortness of breath, weight and weakness which has been going on for several days.  [place] and his family had noted him to be very weak last night.  He is been staying that it is hard for him to get out of bed to walk this morning.  Family called EMS this morning they checked him out but the patient did not want to go to the emergency room at that time.  He was more altered and confused he also has a productive cough and audible rales.  Only thought he may have fallen this morning but they were not sure.  His daughter therefore drove him back into town and instead of taking him to his house she came to the emergency department.  Several years ago the patient had an emergency department visit and subsequent admission for COPD exacerbation requiring BiPAP.  Noted to be in rapid atrial fibrillation hypoxic in triage.  Also several years ago (2012) he was told that he had a mass in his right lung but did not wish for any further work-up.   Patient currently denies nausea, vomiting, chest pain, chest pressure, headache, he is legally blind, neck pain, worsening of his baseline poor vision, dizziness, chills, diarrhea, constipation, rashes, swelling, enlarged lymph nodes.  In the emergency department his white blood cell count was noted to be 40, his room air sat was 83%, his temperature was 100.5, x-ray was consistent with pneumonia with a possible mass.  This mass is in a different location than the previous one.  The patient was referred to me for  further evaluation and management.  Review of Systems: As per HPI otherwise all other systems reviewed and  negative.     Past Medical History:  Diagnosis Date  . COPD (chronic obstructive pulmonary disease) ([place])   . Diabetes mellitus   . Emphysema   . Hypercholesteremia   . Hypertension     Past Surgical History:  Procedure Laterality Date  . BLADDER REPAIR    . CHOLECYSTECTOMY    . SPLENECTOMY, TOTAL      Social History   Social History Narrative  . Not on file     reports that he quit smoking about 23 years ago. His smoking use included cigarettes. He has a 135.00 pack-year smoking history. His smokeless tobacco use includes chew. He reports that he does not drink alcohol or use drugs.  Allergies  Allergen Reactions  . Crestor [Rosuvastatin]   . Latex Itching  . Neomycin-Bacitracin Zn-Polymyx Other (See Comments)    Causes blisters  . Ciprofloxacin Rash    Family History  Problem Relation Age of Onset  . Heart disease Mother   . Heart disease Father      Prior to Admission medications   Medication Sig Start Date End Date Taking? Authorizing Provider  acetaminophen (TYLENOL 8 HOUR) 650 MG CR tablet Take 1,300 mg by mouth every 8 (eight) hours as needed for pain.   Yes [provider]  apixaban (ELIQUIS) 5 MG TABS tablet Take 2.5 mg by  mouth 2 (two) times daily.  09/04/15  Yes [provider]  atorvastatin (LIPITOR) 40 MG tablet TAKE 1 TABLET BY MOUTH ONCE DAILY FOR CHOLESTEROL 07/28/17  Yes Unk Pinto, MD  clobetasol ointment (TEMOVATE) 0.05 % Apply to eczema rash 2-3 x/da 12/31/16  Yes Unk Pinto, MD  Flaxseed, Linseed, (FLAXSEED OIL) 1000 MG CAPS Take 1,000 mg by mouth daily.   Yes [provider]  guaiFENesin (MUCINEX) 600 MG 12 hr tablet Take 1,200 mg by mouth 2 (two) times daily.     Yes [provider]  metFORMIN (GLUCOPHAGE XR) 500 MG 24 hr tablet Take 2 tablets 2 / day for Diabetes 09/10/16 09/07/2017 Yes Unk Pinto, MD  Multiple Vitamin (MULTIVITAMIN WITH MINERALS) TABS tablet Take 1 tablet by mouth daily.   Yes [provider]  Omega-3 Fatty Acids (FISH OIL) 1000 MG CAPS Take by mouth daily.   Yes [provider]  [place] Patient takes OTC folic acid tablet daily.   Yes [provider]  promethazine-dextromethorphan (PROMETHAZINE-DM) 6.25-15 MG/5ML syrup Take 1 to 2 tsp enery 4 hours if needed for cough 04/30/17  Yes Unk Pinto, MD  triamcinolone ointment (KENALOG) 0.1 % Apply 1 application topically 2 (two) times daily. 04/22/17  Yes Vicie Mutters, PA-C  atenolol (TENORMIN) 50 MG tablet Take 1 tablet daily for BP 09/10/16 09/10/17  Unk Pinto, MD  budesonide-formoterol [facility]) 160-4.5 MCG/ACT inhaler Inhale 2 puffs into the lungs 2 (two) times daily. Patient not taking: Reported on 09/20/2017 09/05/15   Forcucci, Loma Sousa, PA-C  glucose blood (ONE TOUCH ULTRA TEST) test strip Check blood sugar 1 time a day. Dx: E11.9 04/14/17   Vicie Mutters, PA-C  tiotropium (SPIRIVA) 18 MCG inhalation capsule Place 1 capsule (18 mcg total) into inhaler and inhale daily. Patient not taking: Reported on 08/29/2017 03/26/16   Forcucci, Loma Sousa, PA-C  traMADol (ULTRAM) 50 MG tablet Take 2 tablets (100 mg total) by mouth every 6 (six) hours as needed. Patient not taking: Reported on 09/09/2017 03/26/16   Starlyn Skeans, PA-C    Physical Exam:  Constitutional: Coughing with audible rhonchi, somnolent, responsive Vitals:   09/05/2017 1530 09/15/2017 1545 08/25/2017 1615 09/04/2017 1630  BP: 97/65 91/61 104/63 (!) 108/56  Pulse: 87 60 77 (!) 108  Resp:  20 (!) 24 (!) 26  Temp:      TempSrc:      SpO2: 94% 93% 96% 91%   Eyes: PERRL, lids and conjunctivae normal ENMT: Mucous membranes are moist. Posterior pharynx clear of any exudate or lesions.Normal dentition.  Neck: normal, supple, no masses, no thyromegaly Respiratory: Bilateral coarse rhonchi with  poor air movement, no wheezes, no rales, moderate increased work of breathing and increased respiratory rate Cardiovascular: Tachycardic rate with regular rhythm, no murmurs / rubs / gallops. No extremity edema. 2+ pedal pulses. No carotid bruits.  Abdomen: no tenderness, no masses palpated. No hepatosplenomegaly. Bowel sounds positive.  Musculoskeletal: no clubbing / cyanosis. No joint deformity upper and lower extremities. Good ROM, no contractures. Normal muscle tone.  Skin: no rashes, lesions, ulcers. No induration Neurologic: CN 2-12 grossly intact. Sensation intact, DTR normal. Strength 5/5 in all 4.  Psychiatric: Normal judgment and insight. Alert and oriented x 3. Normal mood.    Labs on Admission: I have personally reviewed following labs and imaging studies  CBC: Recent Labs  Lab 09/16/2017 1350 09/13/2017 1419  WBC 40.9*  --   NEUTROABS 34.4*  --   HGB 15.9 17.0  HCT  49.9 50.0  MCV 86.6  --   PLT 456*  --    Basic Metabolic Panel: Recent Labs  Lab 09/14/2017 1350 09/01/2017 1419  NA 138 137  K 5.1 5.6*  CL 98* 101  CO2 26  --   GLUCOSE 181* 176*  BUN 31* 45*  CREATININE 1.28* 1.00  CALCIUM 10.5*  --    Liver Function Tests: Recent Labs  Lab 08/29/2017 1350  AST 23  ALT 25  ALKPHOS 80  BILITOT 1.3*  PROT 8.4*  ALBUMIN 4.0   Coagulation Profile: Recent Labs  Lab 09/17/2017 1350  INR 1.12   CBG: Recent Labs  Lab 09/07/2017 1409  GLUCAP 174*   Urine analysis:    Component Value Date/Time   COLORURINE DARK YELLOW 04/22/2017 0915   APPEARANCEUR CLEAR 04/22/2017 0915   LABSPEC 1.021 04/22/2017 0915   PHURINE 5.5 04/22/2017 0915   GLUCOSEU NEGATIVE 04/22/2017 0915   HGBUR NEGATIVE 04/22/2017 0915   BILIRUBINUR NEGATIVE 12/05/2015 1057   KETONESUR NEGATIVE 04/22/2017 0915   PROTEINUR 2+ (A) 04/22/2017 0915   UROBILINOGEN 0.2 11/13/2014 1119   NITRITE NEGATIVE 04/22/2017 0915   LEUKOCYTESUR NEGATIVE 04/22/2017 0915    Radiological Exams on  Admission: Dg Chest 2 View  Result Date: 09/01/2017 CLINICAL DATA:  Short of breath.  Slightly slurred speech. EXAM: CHEST - 2 VIEW COMPARISON:  07/19/2013 FINDINGS: Focal masslike area of opacity is noted in the central right upper lobe projecting anterior to the superolateral right hilum. This measures approximately 4.5 cm on the AP view. Patchy consolidation is noted in the right lung base with milder opacity noted in the medial left lung base. Right lung base opacity corresponds the right middle and adjacent right lower lobes. Remainder of the lungs is clear. No pleural effusion. No pneumothorax. Cardiac silhouette is mildly enlarged. No mediastinal or left hilar masses or convincing adenopathy. Right hilum is partly obscured by the contiguous right upper lobe opacity. Skeletal structures are demineralized but grossly intact. IMPRESSION: 1. Masslike opacity in the central right upper lobe. This may reflect malignancy. It could reflect focal consolidation from pneumonia. Follow-up chest CT with contrast for further assessment is recommended. 2. Patchy right lung base opacity consistent with pneumonia, atelectasis or a combination. Milder left lung base opacity is likely atelectasis. 3. Mild cardiomegaly. Electronically Signed   By: Lajean Manes M.D.   On: 09/09/2017 13:18   Ct Head Wo Contrast  Result Date: 09/11/2017 CLINICAL DATA:  Slurred speech.  Fall. EXAM: CT HEAD WITHOUT CONTRAST CT CERVICAL SPINE WITHOUT CONTRAST TECHNIQUE: Multidetector CT imaging of the head and cervical spine was performed following the standard protocol without intravenous contrast. Multiplanar CT image reconstructions of the cervical spine were also generated. COMPARISON:  None. FINDINGS: CT HEAD FINDINGS Brain: Mild diffuse cortical atrophy is noted. Mild chronic ischemic white matter disease is noted. No mass effect or midline shift is noted. Ventricular size is within normal limits. There is no evidence of mass lesion,  hemorrhage or acute infarction. Vascular: No hyperdense vessel or unexpected calcification. Skull: Normal. Negative for fracture or focal lesion. Sinuses/Orbits: No acute finding. Other: None. CT CERVICAL SPINE FINDINGS Alignment: Normal. Skull base and vertebrae: No acute fracture. No primary bone lesion or focal pathologic process. Soft tissues and spinal canal: No prevertebral fluid or swelling. No visible canal hematoma. Disc levels: Moderate degenerative disc disease is noted at C5-6. Severe degenerative disc disease is noted at C6-7 and C7-T1. Upper chest: Negative. Other: Degenerative changes are seen involving  the left-sided posterior facet joints. IMPRESSION: Mild diffuse cortical atrophy. Mild chronic ischemic white matter disease. No acute intracranial abnormality seen. Multilevel degenerative disc disease. No acute abnormality seen in the cervical spine. Electronically Signed   By: Marijo Conception, M.D.   On: 09/06/2017 13:53   Ct Cervical Spine Wo Contrast  Result Date: 09/13/2017 CLINICAL DATA:  Slurred speech.  Fall. EXAM: CT HEAD WITHOUT CONTRAST CT CERVICAL SPINE WITHOUT CONTRAST TECHNIQUE: Multidetector CT imaging of the head and cervical spine was performed following the standard protocol without intravenous contrast. Multiplanar CT image reconstructions of the cervical spine were also generated. COMPARISON:  None. FINDINGS: CT HEAD FINDINGS Brain: Mild diffuse cortical atrophy is noted. Mild chronic ischemic white matter disease is noted. No mass effect or midline shift is noted. Ventricular size is within normal limits. There is no evidence of mass lesion, hemorrhage or acute infarction. Vascular: No hyperdense vessel or unexpected calcification. Skull: Normal. Negative for fracture or focal lesion. Sinuses/Orbits: No acute finding. Other: None. CT CERVICAL SPINE FINDINGS Alignment: Normal. Skull base and vertebrae: No acute fracture. No primary bone lesion or focal pathologic process. Soft  tissues and spinal canal: No prevertebral fluid or swelling. No visible canal hematoma. Disc levels: Moderate degenerative disc disease is noted at C5-6. Severe degenerative disc disease is noted at C6-7 and C7-T1. Upper chest: Negative. Other: Degenerative changes are seen involving the left-sided posterior facet joints. IMPRESSION: Mild diffuse cortical atrophy. Mild chronic ischemic white matter disease. No acute intracranial abnormality seen. Multilevel degenerative disc disease. No acute abnormality seen in the cervical spine. Electronically Signed   By: Marijo Conception, M.D.   On: 09/21/2017 13:53    EKG: Independently reviewed.  Low voltage QRS with sinus arrhythmia previously was noted to be in atrial fibrillation on April 22, 2017  Assessment/Plan Principal Problem:   Acute respiratory failure ([place]) Active Problems:   Mass of upper lobe of right lung   Severe chronic obstructive pulmonary disease ([place])   Community acquired pneumonia of both lower lobes ([place])   T2_NIDDM w/ CKD   Atrial fibrillation ([place])   Hyperlipidemia   Essential hypertension   1.  Acute respiratory failure: Currently requiring 3 L nasal cannula to keep his sats in the low 90s.  Patient usually on room air.  This is a significant change for him.  We will continue oxygen support.  I expect that his oxygenation will worsen as he is hydrated his pneumonia will likely "blossom".  This is of course very worrisome.  She will be admitted to the stepdown unit.  2.  Community-acquired pneumonia of both lower lobes: We will start azithromycin and ceftriaxone as patient has not had exposure to antibiotics or healthcare environment.  Am concerned that this may be a postobstructive pneumonia.  There is a right sided mass.  I am unsure if this is the same mass that was seen in 2012.  3.  Mass of upper lobe of right lung: This was noted back in 2012 as well as in 2014.  I am not sure whether this is now growing and causing  obstruction or if this is a completely different process.  I have ordered a CTA of the chest to further delineate.  Continue present management.  4.  Severe chronic obstructive pulmonary disease: Patient not on home oxygen currently requiring it.  We will continue oxygen and nebulizers as well as home Spiriva and Dulera inhalers.  5.  Type 2 diabetes with chronic kidney disease:  She with CKD stage III.  We will continue home medications check fingerstick blood glucoses and monitor closely.  6.  Atrial fibrillation: Currently anticoagulated on Eliquis.  Rate controlled with atenolol 50 mg daily.  He did require a dose of diltiazem in the emergency department.  Continue same.  7.  Hyperlipidemia: Continue statin.  8.  Hypertension: Continue atenolol  DVT prophylaxis: Patient on Eliquis Code Status: He wishes to be a full code however if he is found to have cancer he wishes to be a DNR. Family Communication: Spoke with patient's daughter who was present at the bedside. Disposition Plan: Undetermined at this point hopefully home in 3 to 4 days Consults called: None Admission status: Inpatient   Lady Deutscher MD FACP [facility] Pager [phone]  If 7PM-7AM, please contact night-coverage [email or web address] Password [email or web address]  09/13/2017, 4:49 PM

## 2017-09-18 NOTE — ED Notes (Signed)
Attempted report 

## 2017-09-19 DIAGNOSIS — R918 Other nonspecific abnormal finding of lung field: Secondary | ICD-10-CM

## 2017-09-19 DIAGNOSIS — I480 Paroxysmal atrial fibrillation: Secondary | ICD-10-CM

## 2017-09-19 LAB — CBC WITH DIFFERENTIAL/PLATELET
BASOS PCT: 0 %
Basophils Absolute: 0 10*3/uL (ref 0.0–0.1)
EOS PCT: 0 %
Eosinophils Absolute: 0 10*3/uL (ref 0.0–0.7)
HCT: 45.7 % (ref 39.0–52.0)
Hemoglobin: 14 g/dL (ref 13.0–17.0)
LYMPHS ABS: 2.8 10*3/uL (ref 0.7–4.0)
Lymphocytes Relative: 7 %
MCH: 27.5 pg (ref 26.0–34.0)
MCHC: 30.6 g/dL (ref 30.0–36.0)
MCV: 89.8 fL (ref 78.0–100.0)
MONO ABS: 4.9 10*3/uL — AB (ref 0.1–1.0)
Monocytes Relative: 12 %
NEUTROS ABS: 32.8 10*3/uL — AB (ref 1.7–7.7)
Neutrophils Relative %: 81 %
Platelets: 399 10*3/uL (ref 150–400)
RBC: 5.09 MIL/uL (ref 4.22–5.81)
RDW: 13.3 % (ref 11.5–15.5)
WBC Morphology: INCREASED
WBC: 40.5 10*3/uL — ABNORMAL HIGH (ref 4.0–10.5)

## 2017-09-19 LAB — MRSA PCR SCREENING: MRSA by PCR: NEGATIVE

## 2017-09-19 LAB — BLOOD CULTURE ID PANEL (REFLEXED)
Acinetobacter baumannii: NOT DETECTED
CANDIDA GLABRATA: NOT DETECTED
CANDIDA TROPICALIS: NOT DETECTED
Candida albicans: NOT DETECTED
Candida krusei: NOT DETECTED
Candida parapsilosis: NOT DETECTED
ENTEROBACTER CLOACAE COMPLEX: NOT DETECTED
ENTEROCOCCUS SPECIES: NOT DETECTED
Enterobacteriaceae species: NOT DETECTED
Escherichia coli: NOT DETECTED
HAEMOPHILUS INFLUENZAE: NOT DETECTED
KLEBSIELLA PNEUMONIAE: NOT DETECTED
Klebsiella oxytoca: NOT DETECTED
LISTERIA MONOCYTOGENES: NOT DETECTED
METHICILLIN RESISTANCE: NOT DETECTED
Neisseria meningitidis: NOT DETECTED
PROTEUS SPECIES: NOT DETECTED
Pseudomonas aeruginosa: NOT DETECTED
SERRATIA MARCESCENS: NOT DETECTED
STAPHYLOCOCCUS AUREUS BCID: NOT DETECTED
STREPTOCOCCUS AGALACTIAE: NOT DETECTED
Staphylococcus species: DETECTED — AB
Streptococcus pneumoniae: NOT DETECTED
Streptococcus pyogenes: NOT DETECTED
Streptococcus species: NOT DETECTED

## 2017-09-19 LAB — BASIC METABOLIC PANEL
Anion gap: 10 (ref 5–15)
BUN: 35 mg/dL — AB (ref 6–20)
CALCIUM: 9.1 mg/dL (ref 8.9–10.3)
CHLORIDE: 106 mmol/L (ref 101–111)
CO2: 24 mmol/L (ref 22–32)
CREATININE: 1.11 mg/dL (ref 0.61–1.24)
GFR calc Af Amer: >60 mL/min (ref >60)
GFR calc non Af Amer: >60 mL/min (ref >60)
Glucose, Bld: 123 mg/dL — ABNORMAL HIGH (ref 65–99)
Potassium: 5.4 mmol/L — ABNORMAL HIGH (ref 3.5–5.1)
Sodium: 140 mmol/L (ref 135–145)

## 2017-09-19 LAB — GLUCOSE, CAPILLARY
GLUCOSE-CAPILLARY: 110 mg/dL — AB (ref 65–99)
GLUCOSE-CAPILLARY: 162 mg/dL — AB (ref 65–99)
GLUCOSE-CAPILLARY: 160 mg/dL — AB (ref 65–99)
Glucose-Capillary: 65 mg/dL (ref 65–99)

## 2017-09-19 LAB — HIV ANTIBODY (ROUTINE TESTING W REFLEX): HIV Screen 4th Generation wRfx: NONREACTIVE

## 2017-09-19 MED ORDER — DIGOXIN 0.25 MG/ML IJ SOLN
0.2500 mg | INTRAMUSCULAR | Status: AC
  Administered 2017-09-19 (×3): 0.25 mg via INTRAVENOUS
  Filled 2017-09-19 (×3): qty 2

## 2017-09-19 MED ORDER — CHLORHEXIDINE GLUCONATE 0.12 % MT SOLN: 15.0000 mL | Freq: Two times a day (BID) | OROMUCOSAL | Status: DC

## 2017-09-19 MED ORDER — SODIUM CHLORIDE 0.9 % IV SOLN
INTRAVENOUS | Status: DC
  Administered 2017-09-19 – 2017-09-21 (×3): via INTRAVENOUS

## 2017-09-19 MED ORDER — ORAL CARE MOUTH RINSE
15.0000 mL | Freq: Two times a day (BID) | OROMUCOSAL | Status: DC
  Administered 2017-09-19 – 2017-09-20 (×2): 15 mL via OROMUCOSAL

## 2017-09-19 NOTE — Evaluation (Signed)
Clinical/Bedside Swallow Evaluation Patient Details  Name: [name] MRN: [id number] Date of Birth: 07-12-1940  Today's Date: 09/19/2017 Time: SLP Start Time (ACUTE ONLY): 0825 SLP Stop Time (ACUTE ONLY): 0920 SLP Time Calculation (min) (ACUTE ONLY): 55 min  Past Medical History:  Past Medical History:  Diagnosis Date  . COPD (chronic obstructive pulmonary disease) ([place])   . Diabetes mellitus   . Emphysema   . Hypercholesteremia   . Hypertension    Past Surgical History:  Past Surgical History:  Procedure Laterality Date  . BLADDER REPAIR    . CHOLECYSTECTOMY    . SPLENECTOMY, TOTAL     HPI:  77 yo male adm to [facility] with acute respiratory failure.  Pt found to have tumor wrapping around bronchus and pna.  Palliative referral pending and pt made DNR.  Daughter Lavella Lemons arrived and advised pt does not have a power of attorney.  Pt with fever, tachycardia and WBC elevation.   RN noted he was not managing secretions and swallow eval was ordered.  Pt denies dysphagia prior to admission.    Assessment / Plan / Recommendation Clinical Impression  Pt with negative cranial nerve exam, although he leans to the right and had anterior labial spill of liquids *right* via straw.  Cough noted at baseline and more pronounced during intake therefore can not rule out dysphagia/aspiration clinically- but doubt its occurence.  Pt with productive cough x2 to viscous white-tan secretion and was provided with oral suction to clear.    Pt reports desire to continue po intake in lieu of pursuing further testing at this time.  -Recommend dys3 due to poor mastication abilities with thin.  Will follow up next date to determine tolerance, indication for instrumental evaluation.    Daughter present and when asked re: po intake, she stated "It's up to him" - [name] indicated he wants as "least testing done as possible".  Educated pt and daughter to inability to completely rule out aspiration and reviewed  mitigation strategies.  Note palliative referral planned.   SLP Visit Diagnosis: Dysphagia, unspecified (R13.10)    Aspiration Risk  Mild aspiration risk    Diet Recommendation Dysphagia 3 (Mech soft);Thin liquid   Liquid Administration via: Cup;Straw Medication Administration: Whole meds with puree Supervision: Comment(set up assist due to vision deficits) Compensations: Slow rate;Small sips/bites Postural Changes: Seated upright at 90 degrees;Remain upright for at least 30 minutes after po intake    Other  Recommendations Oral Care Recommendations: Oral care BID Other Recommendations: Have oral suction available(leave oral suction turned on and within pt's reach please)   Follow up Recommendations        Frequency and Duration min 1 x/week  1 week       Prognosis   guarded due to medical diagnosis     Swallow Study   General Date of Onset: 09/19/17 HPI: 77 yo male adm to [facility] with acute respiratory failure.  Pt found to have tumor wrapping around bronchus and pna.  Palliative referral pending and pt made DNR.  Daughter Lavella Lemons arrived and advised pt does not have a power of attorney.  Pt with fever, tachycardia and WBC elevation.   RN noted he was not managing secretions and swallow eval was ordered.  Pt denies dysphagia prior to admission.  Type of Study: Bedside Swallow Evaluation Diet Prior to this Study: NPO Temperature Spikes Noted: Yes Respiratory Status: Nasal cannula History of Recent Intubation: No Behavior/Cognition: Alert;Cooperative;Pleasant mood Oral Cavity Assessment: Dry Oral Care Completed  by SLP: No Oral Cavity - Dentition: Dentures, top;Dentures, bottom Vision: Impaired for self-feeding Self-Feeding Abilities: Needs assist(due to vision deficits) Patient Positioning: Postural control interferes with function(SLP had just slid pt up in bed with RN but he slid back down ) Baseline Vocal Quality: Normal Volitional Cough: Strong(at times productive to viscous  secretions - white x2-during session ) Volitional Swallow: Able to elicit    Oral/Motor/Sensory Function Overall Oral Motor/Sensory Function: Generalized oral weakness   Ice Chips Ice chips: Within functional limits Presentation: Spoon   Thin Liquid Thin Liquid: Within functional limits Presentation: Cup;Straw;Self Fed;Spoon    Nectar Thick Nectar Thick Liquid: Not tested   Honey Thick Honey Thick Liquid: Not tested   Puree Puree: Within functional limits Presentation: Self Fed;Spoon   Solid   GO   Solid: Impaired Presentation: Self Fed Oral Phase Impairments: Impaired mastication(vertical mastication pattern) Pharyngeal Phase Impairments: Cough - Delayed        [name] 09/19/2017,9:35 AM  Luanna Salk, [place] [facility] SLP [phone]

## 2017-09-19 NOTE — Progress Notes (Signed)
Due to patient's weakened condition and inability to manage his own secretions, this  Nurse is not giving any oral medications, hydration or food due to risk of aspiration.  [facility] made aware via Amion of patients need for swallow eval.  Awaiting further orders.

## 2017-09-19 NOTE — Progress Notes (Signed)
PROGRESS NOTE                                                                                                                                                                                                             Patient Demographics:    [name], is a 77 y.o. male, DOB - Oct 21, 1940, QPY:[id number]  Admit date - 09/21/2017   Admitting Physician Lady Deutscher, MD  Outpatient Primary MD for the patient is Unk Pinto, MD  LOS - 1   Chief Complaint  Patient presents with  . Weakness       Brief Narrative   77 y.o. male with medical history significant of type 2 diabetes, severe COPD, atrial fibrillation on Eliquis, presented with shortness of breath, work-up was significant for postobstructive pneumonia, with large right upper lobe mass, and has been followed by his pulmonary physician over the last 7 years, where he refused work-up for this progressive mass.    Subjective:    [name] today lethargic, cannot provide any complaints, no significant events overnight.  Assessment  & Plan :    Principal Problem:   Acute respiratory failure (HCC) Active Problems:   T2_NIDDM w/ CKD   Hyperlipidemia   Essential hypertension   Mass of upper lobe of right lung   Atrial fibrillation (HCC)   Severe chronic obstructive pulmonary disease ([place])   Community acquired pneumonia of both lower lobes ([place])   Acute respiratory failure:  -She is usually on room air, currently requiring 3 L nasal cannula, this is secondary to pneumonia, will secondary to lung mass. -Continue with nebs as needed  Postobstructive pneumonia /community-acquired pneumonia  -Presents with white count of 40 K, CT chest significant for postobstructive pneumonia , negative respiratory panel, follow on sputum cultures, follow blood cultures, meanwhile continue with IV Rocephin and azithromycin .  Mass of upper lobe of right lung: - This was noted back in 2012 as  well as in 2014.  Has been following by pulmonary in [place] Dr. Raul Del, apparently refused further work-up in the past, he refused CT referral bronchoscopy or biopsy. -Appears to be progressive at this point, I have discussed with family at bedside including sister, brother-in-law and daughter, at this point no further work-up, they are willing to consider radiation therapy if he is offered, have discussed with radiation Dr. Lisbeth Renshaw,  radiation therapy is not an option here for such large mass with no known pathology. -Palliative care consulted, have discussed with family, for now we will continue with antibiotic therapy, and monitor progression, if no improvement he will be hospice candidate.  Severe chronic obstructive pulmonary disease: -  Patient not on home oxygen currently requiring it.  We will continue oxygen and nebulizers as well as home Spiriva and Dulera inhalers.  Point no active wheezing to indicate initiation of steroids  Type 2 diabetes with chronic kidney disease:  - he with CKD stage III.  We will continue home medications check fingerstick blood glucoses and monitor closely.   Atrial fibrillation:  - Currently anticoagulated on Eliquis.  Rate controlled with atenolol 50 mg daily.  He did require a dose of diltiazem in the emergency department.  Continue same.  Hyperlipidemia:  - Continue statin.  Hypertension:  -Continue atenolol      Code Status : DNR  Family Communication  : Discussed with sister and daughter at bedside  Disposition Plan  : Palliative care consulted,  plan depends on his clinical progression  Consults  :  Palliative  Procedures  : none  DVT Prophylaxis  :  On Eliquis  Lab Results  Component Value Date   PLT 399 09/19/2017    Antibiotics  :    Anti-infectives (From admission, onward)   Start     Dose/Rate Route Frequency Ordered Stop   09/19/17 1000  azithromycin (ZITHROMAX) 500 mg in sodium chloride 0.9 % 250 mL IVPB     500  mg 250 mL/hr over 60 Minutes Intravenous Every 24 hours 09/23/2017 1548 09/24/17 0959   09/19/17 0000  cefTRIAXone (ROCEPHIN) 1 g in sodium chloride 0.9 % 100 mL IVPB     1 g 200 mL/hr over 30 Minutes Intravenous Every 24 hours 09/24/2017 1548     08/28/2017 1415  cefTRIAXone (ROCEPHIN) 1 g in sodium chloride 0.9 % 100 mL IVPB     1 g 200 mL/hr over 30 Minutes Intravenous  Once 09/16/2017 1411     09/23/2017 1415  azithromycin (ZITHROMAX) 500 mg in sodium chloride 0.9 % 250 mL IVPB     500 mg 250 mL/hr over 60 Minutes Intravenous  Once 08/31/2017 1411 09/15/2017 1807        Objective:   Vitals:   09/19/17 0500 09/19/17 0600 09/19/17 0700 09/19/17 0900  BP:    (!) 89/67  Pulse: 97 (!) 44 (!) 25 (!) 102  Resp: (!) 21 (!) 24 (!) 23 (!) 22  Temp:      TempSrc:      SpO2: 100% 98% 98% 96%  Weight:      Height:        Wt Readings from Last 3 Encounters:  09/19/17 80.7 kg (178 lb)  07/29/17 84.4 kg (186 lb)  04/22/17 82.1 kg (181 lb)     Intake/Output Summary (Last 24 hours) at 09/19/2017 1055 Last data filed at 09/19/2017 0600 Gross per 24 hour  Intake 864.25 ml  Output 550 ml  Net 314.25 ml     Physical Exam  Awake Alert, in his eyes and go back to sleep, extremely hard of hearing  Supple Neck,No JVD, Rales and rhonchi in the right lung, he is of accessory muscles, good air entry bilaterally RRR,No Gallops,Rubs or new Murmurs, No Parasternal Heave +ve B.Sounds, Abd Soft, No tenderness, , No rebound - guarding or rigidity. No Cyanosis, Clubbing or edema, No new Rash or bruise  Data Review:    CBC Recent Labs  Lab 08/28/2017 1350 09/07/2017 1419 09/19/17 0219  WBC 40.9*  --  40.5*  HGB 15.9 17.0 14.0  HCT 49.9 50.0 45.7  PLT 456*  --  399  MCV 86.6  --  89.8  MCH 27.6  --  27.5  MCHC 31.9  --  30.6  RDW 13.2  --  13.3  LYMPHSABS 1.6  --  2.8  MONOABS 4.9*  --  4.9*  EOSABS 0.0  --  0.0  BASOSABS 0.0  --  0.0    Chemistries  Recent Labs  Lab 08/30/2017 1350  09/23/2017 1419 09/19/17 0219  NA 138 137 140  K 5.1 5.6* 5.4*  CL 98* 101 106  CO2 26  --  24  GLUCOSE 181* 176* 123*  BUN 31* 45* 35*  CREATININE 1.28* 1.00 1.11  CALCIUM 10.5*  --  9.1  AST 23  --   --   ALT 25  --   --   ALKPHOS 80  --   --   BILITOT 1.3*  --   --    ------------------------------------------------------------------------------------------------------------------ No results for input(s): CHOL, HDL, LDLCALC, TRIG, CHOLHDL, LDLDIRECT in the last 72 hours.  Lab Results  Component Value Date   HGBA1C 6.3 (H) 07/29/2017   ------------------------------------------------------------------------------------------------------------------ No results for input(s): TSH, T4TOTAL, T3FREE, THYROIDAB in the last 72 hours.  Invalid input(s): FREET3 ------------------------------------------------------------------------------------------------------------------ No results for input(s): VITAMINB12, FOLATE, FERRITIN, TIBC, IRON, RETICCTPCT in the last 72 hours.  Coagulation profile Recent Labs  Lab 09/17/2017 1350  INR 1.12    No results for input(s): DDIMER in the last 72 hours.  Cardiac Enzymes No results for input(s): CKMB, TROPONINI, MYOGLOBIN in the last 168 hours.  Invalid input(s): CK ------------------------------------------------------------------------------------------------------------------    Component Value Date/Time   BNP 1,676 (H) 07/18/2013 1823    Inpatient Medications  Scheduled Meds: . acetaminophen  650 mg Oral Q8H  . apixaban  2.5 mg Oral BID  . atenolol  50 mg Oral Daily  . atorvastatin  40 mg Oral q1800  . chlorhexidine  15 mL Mouth Rinse BID  . clobetasol ointment   Topical BID  . guaiFENesin  1,200 mg Oral BID  . insulin aspart  0-15 Units Subcutaneous TID WC  . mouth rinse  15 mL Mouth Rinse q12n4p  . metFORMIN  1,000 mg Oral Q breakfast  . mometasone-formoterol  2 puff Inhalation BID  . multivitamin with minerals  1 tablet  Oral Daily  . omega-3 acid ethyl esters  1 g Oral Daily  . sodium chloride flush  3 mL Intravenous Q12H  . tiotropium  18 mcg Inhalation Daily  . triamcinolone ointment  1 application Topical BID   Continuous Infusions: . sodium chloride    . sodium chloride 250 mL (08/25/2017 2230)  . azithromycin    . cefTRIAXone (ROCEPHIN)  IV    . cefTRIAXone (ROCEPHIN)  IV Stopped (09/02/2017 2303)  . chlorproMAZINE (THORAZINE) IV    . lactated ringers with kcl 75 mL/hr at 09/07/2017 2231   PRN Meds:.sodium chloride, sodium chloride, acetaminophen **OR** acetaminophen, antiseptic oral rinse, chlorpheniramine-HYDROcodone, chlorproMAZINE (THORAZINE) IV, diphenhydrAMINE, glycopyrrolate **OR** glycopyrrolate **OR** glycopyrrolate, haloperidol **OR** haloperidol **OR** haloperidol lactate, ipratropium-albuterol, LORazepam **OR** LORazepam **OR** LORazepam, morphine injection, morphine CONCENTRATE **OR** morphine CONCENTRATE, ondansetron **OR** ondansetron (ZOFRAN) IV, polyvinyl alcohol, sodium chloride flush, sodium chloride flush, traMADol  Micro Results Recent Results (from the past 240 hour(s))  Blood culture (routine x 2)  Status: None (Preliminary result)   Collection Time: 08/26/2017  1:42 PM  Result Value Ref Range Status   Specimen Description BLOOD LEFT ANTECUBITAL  Final   Special Requests   Final    BOTTLES DRAWN AEROBIC AND ANAEROBIC Blood Culture results may not be optimal due to an inadequate volume of blood received in culture bottles   Culture   Final    NO GROWTH < 24 HOURS Performed at [facility] [address], [place], [place] [address]    Report Status PENDING  Incomplete  Blood culture (routine x 2)     Status: None (Preliminary result)   Collection Time: 09/11/2017  3:45 PM  Result Value Ref Range Status   Specimen Description BLOOD RIGHT ANTECUBITAL  Final   Special Requests   Final    BOTTLES DRAWN AEROBIC AND ANAEROBIC Blood Culture adequate volume   Culture   Final     NO GROWTH < 24 HOURS Performed at [facility], [place] [address] [place], [place] [address]    Report Status PENDING  Incomplete  Culture, sputum-assessment     Status: None   Collection Time: 09/06/2017  4:25 PM  Result Value Ref Range Status   Specimen Description EXPECTORATED SPUTUM  Final   Special Requests NONE  Final   Sputum evaluation   Final    THIS SPECIMEN IS ACCEPTABLE FOR SPUTUM CULTURE Performed at [facility], [address] [place], [place] [address]    Report Status 09/01/2017 FINAL  Final  Culture, respiratory (NON-Expectorated)     Status: None (Preliminary result)   Collection Time: 09/21/2017  4:25 PM  Result Value Ref Range Status   Specimen Description EXPECTORATED SPUTUM  Final   Special Requests NONE Reflexed from [id number]  Final   Gram Stain   Final    MODERATE WBC PRESENT, PREDOMINANTLY PMN RARE SQUAMOUS EPITHELIAL CELLS PRESENT ABUNDANT GRAM NEGATIVE COCCOBACILLI FEW GRAM POSITIVE COCCI IN PAIRS Performed at [facility], [place] [address] [place], [place] [address]    Culture PENDING  Incomplete   Report Status PENDING  Incomplete  Respiratory Panel by PCR     Status: None   Collection Time: 08/27/2017  5:15 PM  Result Value Ref Range Status   Adenovirus NOT DETECTED NOT DETECTED Final   Coronavirus 229E NOT DETECTED NOT DETECTED Final   Coronavirus HKU1 NOT DETECTED NOT DETECTED Final   Coronavirus NL63 NOT DETECTED NOT DETECTED Final   Coronavirus OC43 NOT DETECTED NOT DETECTED Final   Metapneumovirus NOT DETECTED NOT DETECTED Final   Rhinovirus / Enterovirus NOT DETECTED NOT DETECTED Final   Influenza A NOT DETECTED NOT DETECTED Final   Influenza B NOT DETECTED NOT DETECTED Final   Parainfluenza Virus 1 NOT DETECTED NOT DETECTED Final   Parainfluenza Virus 2 NOT DETECTED NOT DETECTED Final   Parainfluenza Virus 3 NOT DETECTED NOT DETECTED Final   Parainfluenza Virus 4 NOT DETECTED NOT DETECTED Final   Respiratory Syncytial Virus NOT  DETECTED NOT DETECTED Final   Bordetella pertussis NOT DETECTED NOT DETECTED Final   Chlamydophila pneumoniae NOT DETECTED NOT DETECTED Final   Mycoplasma pneumoniae NOT DETECTED NOT DETECTED Final    Comment: Performed at [facility], [place] [address], [place], [place] [address]  MRSA PCR Screening     Status: None   Collection Time: 09/21/2017  9:08 PM  Result Value Ref Range Status   MRSA by PCR NEGATIVE NEGATIVE Final  Comment:        The GeneXpert MRSA Assay (FDA approved for NASAL specimens only), is one component of a comprehensive MRSA colonization surveillance program. It is not intended to diagnose MRSA infection nor to guide or monitor treatment for MRSA infections. Performed at [facility], [place] [address], [place], [place] [address]     Radiology Reports Dg Chest 2 View  Result Date: 09/01/2017 CLINICAL DATA:  Short of breath.  Slightly slurred speech. EXAM: CHEST - 2 VIEW COMPARISON:  07/19/2013 FINDINGS: Focal masslike area of opacity is noted in the central right upper lobe projecting anterior to the superolateral right hilum. This measures approximately 4.5 cm on the AP view. Patchy consolidation is noted in the right lung base with milder opacity noted in the medial left lung base. Right lung base opacity corresponds the right middle and adjacent right lower lobes. Remainder of the lungs is clear. No pleural effusion. No pneumothorax. Cardiac silhouette is mildly enlarged. No mediastinal or left hilar masses or convincing adenopathy. Right hilum is partly obscured by the contiguous right upper lobe opacity. Skeletal structures are demineralized but grossly intact. IMPRESSION: 1. Masslike opacity in the central right upper lobe. This may reflect malignancy. It could reflect focal consolidation from pneumonia. Follow-up chest CT with contrast for further assessment is recommended. 2. Patchy right lung base opacity consistent with pneumonia, atelectasis or a  combination. Milder left lung base opacity is likely atelectasis. 3. Mild cardiomegaly. Electronically Signed   By: Lajean Manes M.D.   On: 09/04/2017 13:18   Ct Head Wo Contrast  Result Date: 09/04/2017 CLINICAL DATA:  Slurred speech.  Fall. EXAM: CT HEAD WITHOUT CONTRAST CT CERVICAL SPINE WITHOUT CONTRAST TECHNIQUE: Multidetector CT imaging of the head and cervical spine was performed following the standard protocol without intravenous contrast. Multiplanar CT image reconstructions of the cervical spine were also generated. COMPARISON:  None. FINDINGS: CT HEAD FINDINGS Brain: Mild diffuse cortical atrophy is noted. Mild chronic ischemic white matter disease is noted. No mass effect or midline shift is noted. Ventricular size is within normal limits. There is no evidence of mass lesion, hemorrhage or acute infarction. Vascular: No hyperdense vessel or unexpected calcification. Skull: Normal. Negative for fracture or focal lesion. Sinuses/Orbits: No acute finding. Other: None. CT CERVICAL SPINE FINDINGS Alignment: Normal. Skull base and vertebrae: No acute fracture. No primary bone lesion or focal pathologic process. Soft tissues and spinal canal: No prevertebral fluid or swelling. No visible canal hematoma. Disc levels: Moderate degenerative disc disease is noted at C5-6. Severe degenerative disc disease is noted at C6-7 and C7-T1. Upper chest: Negative. Other: Degenerative changes are seen involving the left-sided posterior facet joints. IMPRESSION: Mild diffuse cortical atrophy. Mild chronic ischemic white matter disease. No acute intracranial abnormality seen. Multilevel degenerative disc disease. No acute abnormality seen in the cervical spine. Electronically Signed   By: Marijo Conception, M.D.   On: 09/23/2017 13:53   Ct Chest Wo Contrast  Result Date: 09/12/2017 CLINICAL DATA:  Atrial fibrillation and hypoxia. Mass-like opacity in the central right upper lobe on chest radiographs earlier today as well  as patchy opacity at the lung bases, greater on the right. EXAM: CT CHEST WITHOUT CONTRAST TECHNIQUE: Multidetector CT imaging of the chest was performed following the standard protocol without IV contrast. COMPARISON:  Chest radiographs obtained earlier today and on 07/19/2013. Chest CT dated 07/18/2013. FINDINGS: Cardiovascular: Atheromatous calcifications, including the coronary arteries and aorta. Mediastinum/Nodes: Enlarged right hilar lobes, difficult to separate  from adjacent pulmonary vessels due to the lack of intravenous contrast. One of the nodes measures approximately 15 mm in short axis diameter on image number 76 series 3. Mildly enlarged subcarinal node with a short axis diameter of 12 mm on image number 80 series 3. No other enlarged mediastinal nodes and no enlarged left hilar nodes. No axillary adenopathy. Small, subcentimeter nodules in both lobes of the thyroid gland. Mildly enlarged right lobe of the thyroid gland. No visible esophageal abnormalities. Lungs/Pleura: Large mass-like opacity in the right upper lobe measuring 7.3 x 5.6 cm on image number 59 series 4 and 5.1 cm in length on coronal image number 42. This has a focal extension to the pleura with adjacent mild pleural thickening. There is bronchial encasement with the mass-like opacity some bronchial narrowing. The current mass is at the location of the endobronchial mass seen on 07/18/2013. There is also patchy opacity more peripherally in the right upper lobe. There is also an irregular nodule more inferiorly in the right upper lobe measuring 2.4 x 1.4 cm on image number 79 series 4. Also demonstrated is extensive patchy airspace opacity in the inferior aspects of both lower lobes. Also demonstrated is mild calcified posterior pleural plaque formation on the right. Upper Abdomen: Atheromatous arterial calcifications. Cholecystectomy clips. Upper pole right renal cyst. Periportal edema. Partially included aortocaval lymph node with a  short axis diameter of 8 mm on image number 179 series 3. Musculoskeletal: Thoracic, lower cervical and upper lumbar spine degenerative changes. No evidence of bony metastatic disease. IMPRESSION: 1. 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 Rounded pneumonia is much less likely. 2. Additional 2.4 cm irregular nodule more inferiorly in the right upper lobe, suspicious for a metastasis or 2nd primary lung neoplasm. 3. Additional patchy, nodular opacities more peripherally in the right upper lobe, suspicious for metastases or pneumonia. 4. Extensive patchy opacity in the inferior aspects of both lower lobes, suspicious for pneumonia. 5. Probable metastatic right hilar and subcarinal adenopathy. 6.  Calcific coronary artery and aortic atherosclerosis. 7. Nonspecific right upper abdominal periportal edema. This could be due to hepatitis or acute portal vein occlusion. 8. Partially included borderline enlarged aortocaval lymph node in the upper abdomen. 9. Sub-centimeter thyroid nodule(s) noted, too small to characterize, but most likely benign in the absence of known clinical risk factors for thyroid carcinoma. Aortic Atherosclerosis (ICD10-I70.0). Electronically Signed   By: Claudie Revering M.D.   On: 09/16/2017 18:39   Ct Cervical Spine Wo Contrast  Result Date: 09/11/2017 CLINICAL DATA:  Slurred speech.  Fall. EXAM: CT HEAD WITHOUT CONTRAST CT CERVICAL SPINE WITHOUT CONTRAST TECHNIQUE: Multidetector CT imaging of the head and cervical spine was performed following the standard protocol without intravenous contrast. Multiplanar CT image reconstructions of the cervical spine were also generated. COMPARISON:  None. FINDINGS: CT HEAD FINDINGS Brain: Mild diffuse cortical atrophy is noted. Mild chronic ischemic white matter disease is noted. No mass effect or midline shift is noted. Ventricular size is within normal limits. There is no evidence of mass  lesion, hemorrhage or acute infarction. Vascular: No hyperdense vessel or unexpected calcification. Skull: Normal. Negative for fracture or focal lesion. Sinuses/Orbits: No acute finding. Other: None. CT CERVICAL SPINE FINDINGS Alignment: Normal. Skull base and vertebrae: No acute fracture. No primary bone lesion or focal pathologic process. Soft tissues and spinal canal: No prevertebral fluid or swelling. No visible canal hematoma. Disc levels: Moderate degenerative disc  disease is noted at C5-6. Severe degenerative disc disease is noted at C6-7 and C7-T1. Upper chest: Negative. Other: Degenerative changes are seen involving the left-sided posterior facet joints. IMPRESSION: Mild diffuse cortical atrophy. Mild chronic ischemic white matter disease. No acute intracranial abnormality seen. Multilevel degenerative disc disease. No acute abnormality seen in the cervical spine. Electronically Signed   By: Marijo Conception, M.D.   On: 09/03/2017 13:53     Phillips Climes M.D on 09/19/2017 at 10:55 AM  Between 7am to 7pm - Pager - [phone]  After 7pm go to [email or web address] - password [facility]  [facility] -  Office  [phone]

## 2017-09-19 NOTE — Progress Notes (Signed)
PHARMACY - PHYSICIAN COMMUNICATION CRITICAL VALUE ALERT - BLOOD CULTURE IDENTIFICATION (BCID)  [name] is an 77 y.o. male who presented to [place] on 09/09/2017   Assessment:  1/2 MSSE  Name of physician (or Provider) Contacted: N/A  Current antibiotics: Ceftriaxone/Azithromycin  Changes to prescribed antibiotics recommended:  Patient is on recommended antibiotics - No changes needed  No results found for this or any previous visit.  Harvel Quale 09/19/2017  3:57 PM

## 2017-09-20 DIAGNOSIS — Z7189 Other specified counseling: Secondary | ICD-10-CM

## 2017-09-20 DIAGNOSIS — Z515 Encounter for palliative care: Secondary | ICD-10-CM

## 2017-09-20 DIAGNOSIS — J449 Chronic obstructive pulmonary disease, unspecified: Secondary | ICD-10-CM

## 2017-09-20 DIAGNOSIS — J9602 Acute respiratory failure with hypercapnia: Secondary | ICD-10-CM

## 2017-09-20 DIAGNOSIS — K7200 Acute and subacute hepatic failure without coma: Secondary | ICD-10-CM

## 2017-09-20 DIAGNOSIS — J181 Lobar pneumonia, unspecified organism: Secondary | ICD-10-CM

## 2017-09-20 LAB — BASIC METABOLIC PANEL
Anion gap: 7 (ref 5–15)
BUN: 35 mg/dL — ABNORMAL HIGH (ref 6–20)
CO2: 29 mmol/L (ref 22–32)
Calcium: 9.5 mg/dL (ref 8.9–10.3)
Chloride: 106 mmol/L (ref 101–111)
Creatinine, Ser: 0.97 mg/dL (ref 0.61–1.24)
GFR calc Af Amer: >60 mL/min (ref >60)
GLUCOSE: 114 mg/dL — AB (ref 65–99)
POTASSIUM: 5.4 mmol/L — AB (ref 3.5–5.1)
SODIUM: 142 mmol/L (ref 135–145)

## 2017-09-20 LAB — BLOOD GAS, ARTERIAL
ACID-BASE DEFICIT: 2.4 mmol/L — AB (ref 0.0–2.0)
Acid-Base Excess: 0.1 mmol/L (ref 0.0–2.0)
BICARBONATE: 25.6 mmol/L (ref 20.0–28.0)
Bicarbonate: 26.7 mmol/L (ref 20.0–28.0)
DELIVERY SYSTEMS: POSITIVE
Drawn by: 24513
Drawn by: 280981
EXPIRATORY PAP: 7
FIO2: 21
FIO2: 40
Inspiratory PAP: 14
O2 SAT: 86.3 %
O2 Saturation: 96 %
PATIENT TEMPERATURE: 98.6
PH ART: 7.142 — AB (ref 7.350–7.450)
Patient temperature: 98.6
pCO2 arterial: 65.4 mmHg (ref 32.0–48.0)
pCO2 arterial: 78.1 mmHg (ref 32.0–48.0)
pH, Arterial: 7.235 — ABNORMAL LOW (ref 7.350–7.450)
pO2, Arterial: 50 mmHg — ABNORMAL LOW (ref 83.0–108.0)
pO2, Arterial: 91.2 mmHg (ref 83.0–108.0)

## 2017-09-20 LAB — CULTURE, RESPIRATORY W GRAM STAIN

## 2017-09-20 LAB — GLUCOSE, CAPILLARY
GLUCOSE-CAPILLARY: 113 mg/dL — AB (ref 65–99)
GLUCOSE-CAPILLARY: 125 mg/dL — AB (ref 65–99)
GLUCOSE-CAPILLARY: 140 mg/dL — AB (ref 65–99)
Glucose-Capillary: 118 mg/dL — ABNORMAL HIGH (ref 65–99)

## 2017-09-20 LAB — CBC
HCT: 45.3 % (ref 39.0–52.0)
Hemoglobin: 13.5 g/dL (ref 13.0–17.0)
MCH: 27.7 pg (ref 26.0–34.0)
MCHC: 29.8 g/dL — ABNORMAL LOW (ref 30.0–36.0)
MCV: 93 fL (ref 78.0–100.0)
PLATELETS: 361 10*3/uL (ref 150–400)
RBC: 4.87 MIL/uL (ref 4.22–5.81)
RDW: 13.4 % (ref 11.5–15.5)
WBC: 28.3 10*3/uL — ABNORMAL HIGH (ref 4.0–10.5)

## 2017-09-20 LAB — CULTURE, RESPIRATORY

## 2017-09-20 MED ORDER — METOPROLOL TARTRATE 5 MG/5ML IV SOLN
5.0000 mg | Freq: Four times a day (QID) | INTRAVENOUS | Status: DC
  Administered 2017-09-20: 5 mg via INTRAVENOUS
  Filled 2017-09-20: qty 5

## 2017-09-20 MED ORDER — METHYLPREDNISOLONE SODIUM SUCC 125 MG IJ SOLR
80.0000 mg | Freq: Three times a day (TID) | INTRAMUSCULAR | Status: DC
  Administered 2017-09-20 – 2017-09-21 (×3): 80 mg via INTRAVENOUS
  Filled 2017-09-20 (×3): qty 2

## 2017-09-20 MED ORDER — FAMOTIDINE IN NACL 20-0.9 MG/50ML-% IV SOLN
20.0000 mg | Freq: Two times a day (BID) | INTRAVENOUS | Status: DC
  Administered 2017-09-20: 20 mg via INTRAVENOUS
  Filled 2017-09-20: qty 50

## 2017-09-20 MED ORDER — DILTIAZEM HCL-DEXTROSE 100-5 MG/100ML-% IV SOLN (PREMIX)
5.0000 mg/h | INTRAVENOUS | Status: DC
  Administered 2017-09-21: 5 mg/h via INTRAVENOUS
  Filled 2017-09-20: qty 100

## 2017-09-20 MED ORDER — SODIUM POLYSTYRENE SULFONATE 15 GM/60ML PO SUSP
30.0000 g | Freq: Once | ORAL | Status: DC
  Filled 2017-09-20: qty 120

## 2017-09-20 MED ORDER — ENOXAPARIN SODIUM 80 MG/0.8ML ~~LOC~~ SOLN
1.0000 mg/kg | Freq: Two times a day (BID) | SUBCUTANEOUS | Status: DC
  Administered 2017-09-20 (×2): 80 mg via SUBCUTANEOUS
  Filled 2017-09-20 (×2): qty 0.8

## 2017-09-20 MED ORDER — FAMOTIDINE 20 MG PO TABS: 20.0000 mg | ORAL_TABLET | Freq: Two times a day (BID) | ORAL | Status: DC

## 2017-09-20 MED ORDER — METOPROLOL TARTRATE 5 MG/5ML IV SOLN
5.0000 mg | Freq: Four times a day (QID) | INTRAVENOUS | Status: DC
  Administered 2017-09-20 (×2): 5 mg via INTRAVENOUS
  Filled 2017-09-20 (×2): qty 5

## 2017-09-20 MED ORDER — DILTIAZEM LOAD VIA INFUSION
5.0000 mg | Freq: Once | INTRAVENOUS | Status: AC
  Administered 2017-09-21: 5 mg via INTRAVENOUS
  Filled 2017-09-20: qty 5

## 2017-09-20 MED ORDER — DILTIAZEM HCL 25 MG/5ML IV SOLN
10.0000 mg | Freq: Once | INTRAVENOUS | Status: AC
  Administered 2017-09-20: 10 mg via INTRAVENOUS
  Filled 2017-09-20 (×2): qty 5

## 2017-09-20 NOTE — Progress Notes (Signed)
CSW acknowledging consult for residential hospice placement; however, noted palliative consult pending.   CSW did receive call from Bevely Palmer at [facility], stating that she had received from RN. Bevely Palmer indicated [facility] does not have beds available today.  CSW awaiting palliative meeting with family and will follow for disposition planning.  Estanislado Emms, [place]

## 2017-09-20 NOTE — Progress Notes (Signed)
SLP Cancellation Note  Patient Details Name: RAMELO OETKEN MRN: [id number] DOB: [birth year]   Cancelled treatment:       Reason Eval/Treat Not Completed: Other (comment)(pt was on bipap, RN reports tolerance of po diet yesterday)- no po today due to lethargy.  Luanna Salk, [facility] SLP [phone]'   Macario Golds 09/20/2017, 11:26 AM

## 2017-09-20 NOTE — Progress Notes (Signed)
Went into patient's room to give morning scheduled meds. Patient was lethargic and opening eyes for a short amount of time before closing them. Tried sternal rub and got the patient to wake up but fell back asleep shortly after. Tried nailbed press and get the same result. MD at bedside aware. Received new orders. Will continue to monitor.

## 2017-09-20 NOTE — Progress Notes (Signed)
MD Elgergalwy paged, HR 125-150 A.fib, BP 104/81, pt min responsive, on BIPAP. New orders received 10mg  cardizem IV once.

## 2017-09-20 NOTE — Progress Notes (Signed)
PROGRESS NOTE                                                                                                                                                                                                             Patient Demographics:    [name], is a 77 y.o. male, DOB - Dec 13, 1940, GHW:[id number]  Admit date - 09/21/2017   Admitting Physician Lady Deutscher, MD  Outpatient Primary MD for the patient is Unk Pinto, MD  LOS - 2   Chief Complaint  Patient presents with  . Weakness       Brief Narrative   77 y.o. male with medical history significant of type 2 diabetes, severe COPD, atrial fibrillation on Eliquis, presented with shortness of breath, work-up was significant for postobstructive pneumonia, with large right upper lobe mass, and has been followed by his pulmonary physician over the last 7 years, where he refused work-up for this progressive mass.    Subjective:    [name] today  more lethargic and obtunded this morning, no significant events overnight.  Assessment  & Plan :    Principal Problem:   Acute respiratory failure (HCC) Active Problems:   T2_NIDDM w/ CKD   Hyperlipidemia   Essential hypertension   Mass of upper lobe of right lung   Atrial fibrillation (HCC)   Severe chronic obstructive pulmonary disease ([place])   Community acquired pneumonia of both lower lobes (HCC)   Acute respiratory failure hypoxemia and hypercapnia:  -he is usually on room air, requiring oxygen on presentation, he today showing hypercapnia and hypoxia, I will start on BiPAP given his lethargy . -Continue to treat his pneumonia, started on IV Solu-Medrol as well . -Continue with nebs as needed  `Sepsis secondary to postobstructive pneumonia /community-acquired pneumonia  -Sepsis criteria met on admission, leukocytosis tachypnea, tachycardia with elevated lactic acid -Presents with white count of 40 K, CT chest significant for  postobstructive pneumonia , negative respiratory panel, sputum culture growing haemophilus influenza, patient on IV Rocephin and azithromycin . -1/4 bottles of blood culture growing coag negative staph, most likely contaminant .  Mass of upper lobe of right lung: - This was noted back in 2012 as well as in 2014.  Has been following by pulmonary in [place] Dr. Raul Del, apparently refused further work-up in the past, he refused CT referral  bronchoscopy or biopsy. -Appears to be progressive at this point, I have discussed with family at bedside including sister, brother-in-law and daughter, at this point no further work-up, they are willing to consider radiation therapy if he is offered, have discussed with radiation Dr. Lisbeth Renshaw,  radiation therapy is not an option here for such large mass with no known pathology. -Palliative care consulted, have discussed with family, for now we will continue with antibiotic therapy, and monitor progression, if no improvement he will be hospice candidate.  A Fib with RVR -Is unable to take his p.o. medication given encephalopathy, rate was uncontrolled yesterday, I have loaded him with digoxin yesterday given soft blood pressure, blood pressure is more acceptable today, I will start her scheduled metoprolol 5 mg IV every 6 hours still he is able to take oral and resumed on home dose of atenolol. -On Lovenox until he is able take oral Eliquis  Acute  encephalopathy -His most likely in the setting of infection, and hypercapnia, he was started on BiPAP, if no improvement MRI brain to rule out metastasis.  Severe chronic obstructive pulmonary disease with exacerbation: -  Patient not on home oxygen currently requiring it.  Even though no wheezing, given his significant hypercapnia, I will start on IV Solu-Medrol, with a medication including Spiriva and Dulera.  Type 2 diabetes with chronic kidney disease:  - he with CKD stage III.  We will continue home medications  check fingerstick blood glucoses and monitor closely.  Hyperlipidemia:  - Continue statin.  Hypertension:  -Resume atenolol when able to take oral      Code Status : DNR  Family Communication  : Discussed with daughter at bedside  Disposition Plan  : Palliative care consulted,  plan depends on his clinical progression  Consults  :  Palliative  Procedures  : none  DVT Prophylaxis  :  On Eliquis  Lab Results  Component Value Date   PLT 361 09/20/2017    Antibiotics  :    Anti-infectives (From admission, onward)   Start     Dose/Rate Route Frequency Ordered Stop   09/19/17 1000  azithromycin (ZITHROMAX) 500 mg in sodium chloride 0.9 % 250 mL IVPB     500 mg 250 mL/hr over 60 Minutes Intravenous Every 24 hours 09/03/2017 1548 09/24/17 0959   09/19/17 0000  cefTRIAXone (ROCEPHIN) 1 g in sodium chloride 0.9 % 100 mL IVPB     1 g 200 mL/hr over 30 Minutes Intravenous Every 24 hours 09/06/2017 1548     09/13/2017 1415  cefTRIAXone (ROCEPHIN) 1 g in sodium chloride 0.9 % 100 mL IVPB     1 g 200 mL/hr over 30 Minutes Intravenous  Once 09/11/2017 1411     09/17/2017 1415  azithromycin (ZITHROMAX) 500 mg in sodium chloride 0.9 % 250 mL IVPB     500 mg 250 mL/hr over 60 Minutes Intravenous  Once 08/28/2017 1411 09/11/2017 1807        Objective:   Vitals:   09/20/17 0300 09/20/17 0311 09/20/17 0751 09/20/17 0954  BP:  (!) 147/79 109/73   Pulse: (!) 131 91 (!) 112 (!) 120  Resp: 20 20 (!) 22 (!) 21  Temp:  99 F (37.2 C) 99.4 F (37.4 C)   TempSrc:  Axillary Axillary   SpO2: 99% 100% 97% 98%  Weight:      Height:        Wt Readings from Last 3 Encounters:  09/19/17 80.7 kg (178 lb)  07/29/17  84.4 kg (186 lb)  04/22/17 82.1 kg (181 lb)     Intake/Output Summary (Last 24 hours) at 09/20/2017 1049 Last data filed at 09/20/2017 0400 Gross per 24 hour  Intake 2208 ml  Output 1150 ml  Net 1058 ml     Physical Exam  Patient is more lethargic/obtunded this morning,  arousable to painful stimuli Supple Neck,No JVD, Fair air entry bilaterally, minimal wheezing, no use of accessory muscles . Regular, tachycardic,No Gallops,Rubs or new Murmurs, No Parasternal Heave +ve B.Sounds, Abd Soft, No tenderness, , No rebound - guarding or rigidity. No cyanosis, clubbing or edema, no new rash    Data Review:    CBC Recent Labs  Lab 09/20/2017 1350 08/30/2017 1419 09/19/17 0219 09/20/17 0301  WBC 40.9*  --  40.5* 28.3*  HGB 15.9 17.0 14.0 13.5  HCT 49.9 50.0 45.7 45.3  PLT 456*  --  399 361  MCV 86.6  --  89.8 93.0  MCH 27.6  --  27.5 27.7  MCHC 31.9  --  30.6 29.8*  RDW 13.2  --  13.3 13.4  LYMPHSABS 1.6  --  2.8  --   MONOABS 4.9*  --  4.9*  --   EOSABS 0.0  --  0.0  --   BASOSABS 0.0  --  0.0  --     Chemistries  Recent Labs  Lab 09/04/2017 1350 09/07/2017 1419 09/19/17 0219 09/20/17 0301  NA 138 137 140 142  K 5.1 5.6* 5.4* 5.4*  CL 98* 101 106 106  CO2 26  --  24 29  GLUCOSE 181* 176* 123* 114*  BUN 31* 45* 35* 35*  CREATININE 1.28* 1.00 1.11 0.97  CALCIUM 10.5*  --  9.1 9.5  AST 23  --   --   --   ALT 25  --   --   --   ALKPHOS 80  --   --   --   BILITOT 1.3*  --   --   --    ------------------------------------------------------------------------------------------------------------------ No results for input(s): CHOL, HDL, LDLCALC, TRIG, CHOLHDL, LDLDIRECT in the last 72 hours.  Lab Results  Component Value Date   HGBA1C 6.3 (H) 07/29/2017   ------------------------------------------------------------------------------------------------------------------ No results for input(s): TSH, T4TOTAL, T3FREE, THYROIDAB in the last 72 hours.  Invalid input(s): FREET3 ------------------------------------------------------------------------------------------------------------------ No results for input(s): VITAMINB12, FOLATE, FERRITIN, TIBC, IRON, RETICCTPCT in the last 72 hours.  Coagulation profile Recent Labs  Lab 09/13/2017 1350  INR  1.12    No results for input(s): DDIMER in the last 72 hours.  Cardiac Enzymes No results for input(s): CKMB, TROPONINI, MYOGLOBIN in the last 168 hours.  Invalid input(s): CK ------------------------------------------------------------------------------------------------------------------    Component Value Date/Time   BNP 1,676 (H) 07/18/2013 1823    Inpatient Medications  Scheduled Meds: . acetaminophen  650 mg Oral Q8H  . apixaban  2.5 mg Oral BID  . atenolol  50 mg Oral Daily  . atorvastatin  40 mg Oral q1800  . chlorhexidine  15 mL Mouth Rinse BID  . clobetasol ointment   Topical BID  . guaiFENesin  1,200 mg Oral BID  . insulin aspart  0-15 Units Subcutaneous TID WC  . mouth rinse  15 mL Mouth Rinse q12n4p  . metFORMIN  1,000 mg Oral Q breakfast  . mometasone-formoterol  2 puff Inhalation BID  . multivitamin with minerals  1 tablet Oral Daily  . omega-3 acid ethyl esters  1 g Oral Daily  . sodium chloride flush  3  mL Intravenous Q12H  . sodium polystyrene  30 g Oral Once  . tiotropium  18 mcg Inhalation Daily  . triamcinolone ointment  1 application Topical BID   Continuous Infusions: . sodium chloride    . sodium chloride 250 mL (09/01/2017 2230)  . sodium chloride 75 mL/hr at 09/19/17 1219  . azithromycin 500 mg (09/20/17 0939)  . cefTRIAXone (ROCEPHIN)  IV    . cefTRIAXone (ROCEPHIN)  IV Stopped (09/20/17 0030)  . chlorproMAZINE (THORAZINE) IV     PRN Meds:.sodium chloride, sodium chloride, acetaminophen **OR** acetaminophen, antiseptic oral rinse, chlorpheniramine-HYDROcodone, chlorproMAZINE (THORAZINE) IV, diphenhydrAMINE, glycopyrrolate **OR** glycopyrrolate **OR** glycopyrrolate, haloperidol **OR** haloperidol **OR** haloperidol lactate, ipratropium-albuterol, LORazepam **OR** LORazepam **OR** LORazepam, morphine injection, morphine CONCENTRATE **OR** morphine CONCENTRATE, ondansetron **OR** ondansetron (ZOFRAN) IV, polyvinyl alcohol, sodium chloride flush,  sodium chloride flush, traMADol  Micro Results Recent Results (from the past 240 hour(s))  Blood culture (routine x 2)     Status: None (Preliminary result)   Collection Time: 09/24/2017  1:42 PM  Result Value Ref Range Status   Specimen Description BLOOD LEFT ANTECUBITAL  Final   Special Requests   Final    BOTTLES DRAWN AEROBIC AND ANAEROBIC Blood Culture results may not be optimal due to an inadequate volume of blood received in culture bottles   Culture   Final    NO GROWTH < 24 HOURS Performed at [facility], [address], [place], [place] [address]    Report Status PENDING  Incomplete  Blood culture (routine x 2)     Status: Abnormal (Preliminary result)   Collection Time: 09/04/2017  3:45 PM  Result Value Ref Range Status   Specimen Description BLOOD RIGHT ANTECUBITAL  Final   Special Requests   Final    BOTTLES DRAWN AEROBIC AND ANAEROBIC Blood Culture adequate volume   Culture  Setup Time   Final    GRAM POSITIVE COCCI AEROBIC BOTTLE ONLY CRITICAL RESULT CALLED TO, READ BACK BY AND VERIFIED WITH: A. MASTERS PHARMD, AT 1556 09/19/17 BY Rush Landmark Performed at [facility], [place] [address] [place], [place] [address]    Culture STAPHYLOCOCCUS SPECIES (COAGULASE NEGATIVE) (A)  Final   Report Status PENDING  Incomplete  Blood Culture ID Panel (Reflexed)     Status: Abnormal   Collection Time: 09/17/2017  3:45 PM  Result Value Ref Range Status   Enterococcus species NOT DETECTED NOT DETECTED Final   Listeria monocytogenes NOT DETECTED NOT DETECTED Final   Staphylococcus species DETECTED (A) NOT DETECTED Final    Comment: Methicillin (oxacillin) susceptible coagulase negative staphylococcus. Possible blood culture contaminant (unless isolated from more than one blood culture draw or clinical case suggests pathogenicity). No antibiotic treatment is indicated for blood  culture contaminants. CRITICAL RESULT CALLED TO, READ BACK BY AND VERIFIED WITH: A. MASTERS PHARMD, AT  1556 09/19/17 BY D. VANHOOK    Staphylococcus aureus NOT DETECTED NOT DETECTED Final   Methicillin resistance NOT DETECTED NOT DETECTED Final   Streptococcus species NOT DETECTED NOT DETECTED Final   Streptococcus agalactiae NOT DETECTED NOT DETECTED Final   Streptococcus pneumoniae NOT DETECTED NOT DETECTED Final   Streptococcus pyogenes NOT DETECTED NOT DETECTED Final   Acinetobacter baumannii NOT DETECTED NOT DETECTED Final   Enterobacteriaceae species NOT DETECTED NOT DETECTED Final   Enterobacter cloacae complex NOT DETECTED NOT DETECTED Final   Escherichia coli NOT DETECTED NOT DETECTED Final   Klebsiella oxytoca NOT DETECTED NOT DETECTED Final   Klebsiella pneumoniae NOT DETECTED  NOT DETECTED Final   Proteus species NOT DETECTED NOT DETECTED Final   Serratia marcescens NOT DETECTED NOT DETECTED Final   Haemophilus influenzae NOT DETECTED NOT DETECTED Final   Neisseria meningitidis NOT DETECTED NOT DETECTED Final   Pseudomonas aeruginosa NOT DETECTED NOT DETECTED Final   Candida albicans NOT DETECTED NOT DETECTED Final   Candida glabrata NOT DETECTED NOT DETECTED Final   Candida krusei NOT DETECTED NOT DETECTED Final   Candida parapsilosis NOT DETECTED NOT DETECTED Final   Candida tropicalis NOT DETECTED NOT DETECTED Final    Comment: Performed at [facility], [place] [address], [place], [place] [address]  Culture, sputum-assessment     Status: None   Collection Time: 08/30/2017  4:25 PM  Result Value Ref Range Status   Specimen Description EXPECTORATED SPUTUM  Final   Special Requests NONE  Final   Sputum evaluation   Final    THIS SPECIMEN IS ACCEPTABLE FOR SPUTUM CULTURE Performed at [facility], [address] [place], [place] [address]    Report Status 09/21/2017 FINAL  Final  Culture, respiratory (NON-Expectorated)     Status: None   Collection Time: 09/10/2017  4:25 PM  Result Value Ref Range Status   Specimen Description EXPECTORATED SPUTUM  Final   Special  Requests NONE Reflexed from [id number]  Final   Gram Stain   Final    MODERATE WBC PRESENT, PREDOMINANTLY PMN RARE SQUAMOUS EPITHELIAL CELLS PRESENT ABUNDANT GRAM NEGATIVE COCCOBACILLI FEW GRAM POSITIVE COCCI IN PAIRS    Culture   Final    ABUNDANT HAEMOPHILUS INFLUENZAE BETA LACTAMASE NEGATIVE Performed at [facility], [place] [address] [place], [place] [address]    Report Status 09/20/2017 FINAL  Final  Respiratory Panel by PCR     Status: None   Collection Time: 09/20/2017  5:15 PM  Result Value Ref Range Status   Adenovirus NOT DETECTED NOT DETECTED Final   Coronavirus 229E NOT DETECTED NOT DETECTED Final   Coronavirus HKU1 NOT DETECTED NOT DETECTED Final   Coronavirus NL63 NOT DETECTED NOT DETECTED Final   Coronavirus OC43 NOT DETECTED NOT DETECTED Final   Metapneumovirus NOT DETECTED NOT DETECTED Final   Rhinovirus / Enterovirus NOT DETECTED NOT DETECTED Final   Influenza A NOT DETECTED NOT DETECTED Final   Influenza B NOT DETECTED NOT DETECTED Final   Parainfluenza Virus 1 NOT DETECTED NOT DETECTED Final   Parainfluenza Virus 2 NOT DETECTED NOT DETECTED Final   Parainfluenza Virus 3 NOT DETECTED NOT DETECTED Final   Parainfluenza Virus 4 NOT DETECTED NOT DETECTED Final   Respiratory Syncytial Virus NOT DETECTED NOT DETECTED Final   Bordetella pertussis NOT DETECTED NOT DETECTED Final   Chlamydophila pneumoniae NOT DETECTED NOT DETECTED Final   Mycoplasma pneumoniae NOT DETECTED NOT DETECTED Final    Comment: Performed at [facility], [place] [address], [place], [place] [address]  MRSA PCR Screening     Status: None   Collection Time: 09/21/2017  9:08 PM  Result Value Ref Range Status   MRSA by PCR NEGATIVE NEGATIVE Final    Comment:        The GeneXpert MRSA Assay (FDA approved for NASAL specimens only), is one component of a comprehensive MRSA colonization surveillance program. It is not intended to diagnose MRSA infection nor to guide or monitor treatment  for MRSA infections. Performed at [facility], [place] [address] [place], [place] [address]     Radiology Reports Dg Chest 2  View  Result Date: 08/29/2017 CLINICAL DATA:  Short of breath.  Slightly slurred speech. EXAM: CHEST - 2 VIEW COMPARISON:  07/19/2013 FINDINGS: Focal masslike area of opacity is noted in the central right upper lobe projecting anterior to the superolateral right hilum. This measures approximately 4.5 cm on the AP view. Patchy consolidation is noted in the right lung base with milder opacity noted in the medial left lung base. Right lung base opacity corresponds the right middle and adjacent right lower lobes. Remainder of the lungs is clear. No pleural effusion. No pneumothorax. Cardiac silhouette is mildly enlarged. No mediastinal or left hilar masses or convincing adenopathy. Right hilum is partly obscured by the contiguous right upper lobe opacity. Skeletal structures are demineralized but grossly intact. IMPRESSION: 1. Masslike opacity in the central right upper lobe. This may reflect malignancy. It could reflect focal consolidation from pneumonia. Follow-up chest CT with contrast for further assessment is recommended. 2. Patchy right lung base opacity consistent with pneumonia, atelectasis or a combination. Milder left lung base opacity is likely atelectasis. 3. Mild cardiomegaly. Electronically Signed   By: Lajean Manes M.D.   On: 09/08/2017 13:18   Ct Head Wo Contrast  Result Date: 09/03/2017 CLINICAL DATA:  Slurred speech.  Fall. EXAM: CT HEAD WITHOUT CONTRAST CT CERVICAL SPINE WITHOUT CONTRAST TECHNIQUE: Multidetector CT imaging of the head and cervical spine was performed following the standard protocol without intravenous contrast. Multiplanar CT image reconstructions of the cervical spine were also generated. COMPARISON:  None. FINDINGS: CT HEAD FINDINGS Brain: Mild diffuse cortical atrophy is noted. Mild chronic ischemic white matter disease is noted. No mass  effect or midline shift is noted. Ventricular size is within normal limits. There is no evidence of mass lesion, hemorrhage or acute infarction. Vascular: No hyperdense vessel or unexpected calcification. Skull: Normal. Negative for fracture or focal lesion. Sinuses/Orbits: No acute finding. Other: None. CT CERVICAL SPINE FINDINGS Alignment: Normal. Skull base and vertebrae: No acute fracture. No primary bone lesion or focal pathologic process. Soft tissues and spinal canal: No prevertebral fluid or swelling. No visible canal hematoma. Disc levels: Moderate degenerative disc disease is noted at C5-6. Severe degenerative disc disease is noted at C6-7 and C7-T1. Upper chest: Negative. Other: Degenerative changes are seen involving the left-sided posterior facet joints. IMPRESSION: Mild diffuse cortical atrophy. Mild chronic ischemic white matter disease. No acute intracranial abnormality seen. Multilevel degenerative disc disease. No acute abnormality seen in the cervical spine. Electronically Signed   By: Marijo Conception, M.D.   On: 09/04/2017 13:53   Ct Chest Wo Contrast  Result Date: 09/23/2017 CLINICAL DATA:  Atrial fibrillation and hypoxia. Mass-like opacity in the central right upper lobe on chest radiographs earlier today as well as patchy opacity at the lung bases, greater on the right. EXAM: CT CHEST WITHOUT CONTRAST TECHNIQUE: Multidetector CT imaging of the chest was performed following the standard protocol without IV contrast. COMPARISON:  Chest radiographs obtained earlier today and on 07/19/2013. Chest CT dated 07/18/2013. FINDINGS: Cardiovascular: Atheromatous calcifications, including the coronary arteries and aorta. Mediastinum/Nodes: Enlarged right hilar lobes, difficult to separate from adjacent pulmonary vessels due to the lack of intravenous contrast. One of the nodes measures approximately 15 mm in short axis diameter on image number 76 series 3. Mildly enlarged subcarinal node with a short  axis diameter of 12 mm on image number 80 series 3. No other enlarged mediastinal nodes and no enlarged left hilar nodes. No axillary adenopathy. Small, subcentimeter nodules in both lobes  of the thyroid gland. Mildly enlarged right lobe of the thyroid gland. No visible esophageal abnormalities. Lungs/Pleura: Large mass-like opacity in the right upper lobe measuring 7.3 x 5.6 cm on image number 59 series 4 and 5.1 cm in length on coronal image number 42. This has a focal extension to the pleura with adjacent mild pleural thickening. There is bronchial encasement with the mass-like opacity some bronchial narrowing. The current mass is at the location of the endobronchial mass seen on 07/18/2013. There is also patchy opacity more peripherally in the right upper lobe. There is also an irregular nodule more inferiorly in the right upper lobe measuring 2.4 x 1.4 cm on image number 79 series 4. Also demonstrated is extensive patchy airspace opacity in the inferior aspects of both lower lobes. Also demonstrated is mild calcified posterior pleural plaque formation on the right. Upper Abdomen: Atheromatous arterial calcifications. Cholecystectomy clips. Upper pole right renal cyst. Periportal edema. Partially included aortocaval lymph node with a short axis diameter of 8 mm on image number 179 series 3. Musculoskeletal: Thoracic, lower cervical and upper lumbar spine degenerative changes. No evidence of bony metastatic disease. IMPRESSION: 1. 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 Rounded pneumonia is much less likely. 2. Additional 2.4 cm irregular nodule more inferiorly in the right upper lobe, suspicious for a metastasis or 2nd primary lung neoplasm. 3. Additional patchy, nodular opacities more peripherally in the right upper lobe, suspicious for metastases or pneumonia. 4. Extensive patchy opacity in the inferior aspects of both lower lobes, suspicious for  pneumonia. 5. Probable metastatic right hilar and subcarinal adenopathy. 6.  Calcific coronary artery and aortic atherosclerosis. 7. Nonspecific right upper abdominal periportal edema. This could be due to hepatitis or acute portal vein occlusion. 8. Partially included borderline enlarged aortocaval lymph node in the upper abdomen. 9. Sub-centimeter thyroid nodule(s) noted, too small to characterize, but most likely benign in the absence of known clinical risk factors for thyroid carcinoma. Aortic Atherosclerosis (ICD10-I70.0). Electronically Signed   By: Claudie Revering M.D.   On: 09/03/2017 18:39   Ct Cervical Spine Wo Contrast  Result Date: 09/23/2017 CLINICAL DATA:  Slurred speech.  Fall. EXAM: CT HEAD WITHOUT CONTRAST CT CERVICAL SPINE WITHOUT CONTRAST TECHNIQUE: Multidetector CT imaging of the head and cervical spine was performed following the standard protocol without intravenous contrast. Multiplanar CT image reconstructions of the cervical spine were also generated. COMPARISON:  None. FINDINGS: CT HEAD FINDINGS Brain: Mild diffuse cortical atrophy is noted. Mild chronic ischemic white matter disease is noted. No mass effect or midline shift is noted. Ventricular size is within normal limits. There is no evidence of mass lesion, hemorrhage or acute infarction. Vascular: No hyperdense vessel or unexpected calcification. Skull: Normal. Negative for fracture or focal lesion. Sinuses/Orbits: No acute finding. Other: None. CT CERVICAL SPINE FINDINGS Alignment: Normal. Skull base and vertebrae: No acute fracture. No primary bone lesion or focal pathologic process. Soft tissues and spinal canal: No prevertebral fluid or swelling. No visible canal hematoma. Disc levels: Moderate degenerative disc disease is noted at C5-6. Severe degenerative disc disease is noted at C6-7 and C7-T1. Upper chest: Negative. Other: Degenerative changes are seen involving the left-sided posterior facet joints. IMPRESSION: Mild diffuse  cortical atrophy. Mild chronic ischemic white matter disease. No acute intracranial abnormality seen. Multilevel degenerative disc disease. No acute abnormality seen in the cervical spine. Electronically Signed   By: Marijo Conception, M.D.  On: 08/31/2017 13:53     Phillips Climes M.D on 09/20/2017 at 10:49 AM  Between 7am to 7pm - Pager - [phone]  After 7pm go to [email or web address] - password [facility]  [facility] -  Office  [phone]

## 2017-09-20 NOTE — Progress Notes (Signed)
RT NOTE: Patient placed on bipap d/t critical abg results. 14/7 R 12 50%. Will continue to monitor.

## 2017-09-20 NOTE — Progress Notes (Addendum)
Patient HR 125-155 in A. Fib. BP 135/80. Patient agitated and restless pulling off bi pap. Remains minimally responsive to pain and voice  PRNS's for agitation given.   Baltazar Najjar, NP notified about pt HR New orders received for dose of lopressor 5 mg.  HR currently 120-135  Baltazar Najjar, NP updated. New orders for repeat ABG and start cardizem gtt.  Provider at bedside.

## 2017-09-20 NOTE — Consult Note (Signed)
Consultation Note Date: 09/20/2017   Patient Name: [name]  DOB: 1940/10/29  MRN: [id number]  Age / Sex: 77 y.o., male  PCP: Ryan Pinto, MD Referring Physician: Albertine Patricia, MD  Reason for Consultation: Establishing goals of care  HPI/Patient Profile: 77 y.o. male  with past medical history of hypertension, hypercholesteremia, COPD, DM, afib on coumadin, macular degeneration, and right lung mass diagnosed in 2012 admitted on 09/20/2017 with shortness of breath and weakness. [name] workup signification for postobstructive pneumonia with large right upper lobe mass wrapping around right mainstem bronchus. Followed by outpatient pulmonology for right lung mass that he refused workup for. Attending has spoke with Dr. Lisbeth Andrews regarding radiation, which is not an option due to unknown pathology. On 5/27, patient with increased lethargy. ABG revealed CO2 65. Patient receiving IV antibiotics and steroids. Placed on BiPAP. Palliative medicine consultation for goals of care.   Clinical Assessment and Goals of Care:  I have reviewed medical records, discussed with care team, and met with daughter [name]), and son-in-law [name]) in conference room to discuss diagnosis, prognosis, [place], EOL wishes, disposition and options. Patient remains lethargic on BiPAP and unable to participate in conversation.   Introduced Palliative Medicine as specialized medical care for people living with serious illness. It focuses on providing relief from the symptoms and stress of a serious illness. The goal is to improve quality of life for both the patient and the family.  We discussed a brief life review of the patient. Worked as a Dealer. Divorced. [name] is only child. She describes him as "stubborn" and "strong-willed." Prior to hospitalization, living independently. 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 [name] and [name] speak of hospitalization for pneumonia in 2012, when the lung mass was found. He followed up with outpatient pulmonology but declined workup for lung mass.   Discussed events leading up to hospitalization and hospital diagnoses/interventions. [name] understands concern with clinical decline with lethargy and requiring BiPAP today. She speaks of him sitting up in chair eating lunch yesterday. We discussed her conversation with Dr. Waldron Andrews. [name] and [name] understand he is not a candidate for radiation with unknown pathology. They understand he is receiving IV antibiotics and steroids.   I attempted to elicit values and goals of care important to the patient. [name] speaks of her frustrations with her father being "stubborn" and not pursuing work-up for lung mass when it was first diagnosed and small. "Radiation could have helped" but he "never wanted to be cut on." [name] speaks of scheduling a biopsy for him but he adamantly refused to go to the appointment when he found out about the biopsy.   Advanced directives, concepts specific to code status, and artifical feeding and hydration were discussed. 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 [name] confirm the patient's conversation with [name] physician regarding  CT results revealing lung mass/likely cancer, he requested a DNR. They believe he would NOT want prolonged heroic interventions if not showing clinical improvement.    The difference between aggressive medical intervention and comfort care was considered in light of the patient's goals of care. [name] speaks of wanting "comfort" for her father but remains hopeful that he may show improvement in mental status with BiPAP. She does seems very realistic in big picture of clinical decline with respiratory failure secondary to  postobstructive pneumonia, lung mass obstructing bronchus, and COPD. She states "he is never going home again" and mentions hospice services. [name] also shares that she wishes he live till Thursday in order for her grandson to visit. I encouraged her to notify grandson sooner than later, especially with change in clinical condition today.   [name] asks about hospice services. Hospice and Palliative Care services outpatient were explained and offered. Educated on hospice philosophy with goal of comfort and dignity at EOL. Educated on using medications for symptom management to ensure relief from suffering. Explained my fear that he is eligible for residential hospice services, explaining prognosis of likely 2 weeks or less.   Family appreciative of my visit. Questions and concerns addressed. PMT contact information given.     SUMMARY OF RECOMMENDATIONS    DNR/DNI  Continue current interventions including BiPAP.   Family has a good understanding of diagnoses, interventions, and poor prognosis. Daughter wishes to continue BiPAP for 24-48 hours in hopes of improvement in mental status. She does seem to be realistic of prognosis and eligibility of residential hospice facility. She does confirm his wishes against prolonged heroic interventions and declining workup for lung mass.   Daughter also waiting on more family to visit before withdrawing care.   PMT will follow.   Code Status/Advance Care Planning:  DNR  Symptom Management:   Per attending  Palliative Prophylaxis:   Aspiration, Delirium Protocol, Oral Care and Turn Reposition  Psycho-social/Spiritual:   Desire for further Chaplaincy support:no  Additional Recommendations: Caregiving  Support/Resources and Education on Hospice  Prognosis:   Unable to determine: poor prognosis with acute respiratory failure secondary to post-obstructive pneumonia, lung mass likely malignancy, and underlying COPD.   Discharge Planning: To Be  Determined      Primary Diagnoses: Present on Admission: . Severe chronic obstructive pulmonary disease ([place]) . Essential hypertension . 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 Hyperlipidemia . Atrial fibrillation ([place]) . Acute respiratory failure ([place]) . Community acquired pneumonia of both lower lobes ([place]) . Mass of upper lobe of right lung   I have reviewed the medical record, interviewed the patient and family, and examined the patient. The following aspects are pertinent.  Past Medical History:  Diagnosis Date  . COPD (chronic obstructive pulmonary disease) ([place])   . Diabetes mellitus   . Emphysema   . Hypercholesteremia   . Hypertension    Social History   Socioeconomic History  . Marital status: Divorced    Spouse name: Not on file  . Number of children: Not on file  . Years of education: Not on file  . Highest education level: Not on file  Occupational History  . Not on file  Social Needs  . Financial resource strain: Not on file  . Food insecurity:    Worry: Not on file    Inability: Not on file  . Transportation needs:    Medical: Not on file    Non-medical: Not on file  Tobacco Use  . Smoking status: Former Smoker  Packs/day: 3.00    Years: 45.00    Pack years: 135.00    Types: Cigarettes    Last attempt to quit: 04/27/1994    Years since quitting: 23.4  . Smokeless tobacco: Current User    Types: Chew  Substance and Sexual Activity  . Alcohol use: No  . Drug use: No  . Sexual activity: Not Currently    Birth control/protection: None  Lifestyle  . Physical activity:    Days per week: Not on file    Minutes per session: Not on file  . Stress: Not on file  Relationships  . Social connections:    Talks on phone: Not on file    Gets together: Not on file    Attends religious service: Not on file    Active member of club or organization: Not on file    Attends meetings of clubs or organizations: Not on file    Relationship status: Not on file  Other Topics  Concern  . Not on file  Social History Narrative  . Not on file   Family History  Problem Relation Age of Onset  . Heart disease Mother   . Heart disease Father    Scheduled Meds: . acetaminophen  650 mg Oral Q8H  . atorvastatin  40 mg Oral q1800  . chlorhexidine  15 mL Mouth Rinse BID  . clobetasol ointment   Topical BID  . enoxaparin (LOVENOX) injection  1 mg/kg Subcutaneous Q12H  . guaiFENesin  1,200 mg Oral BID  . insulin aspart  0-15 Units Subcutaneous TID WC  . mouth rinse  15 mL Mouth Rinse q12n4p  . methylPREDNISolone (SOLU-MEDROL) injection  80 mg Intravenous Q8H  . metoprolol tartrate  5 mg Intravenous Q6H  . mometasone-formoterol  2 puff Inhalation BID  . multivitamin with minerals  1 tablet Oral Daily  . omega-3 acid ethyl esters  1 g Oral Daily  . sodium chloride flush  3 mL Intravenous Q12H  . sodium polystyrene  30 g Oral Once  . tiotropium  18 mcg Inhalation Daily  . triamcinolone ointment  1 application Topical BID   Continuous Infusions: . sodium chloride    . sodium chloride 250 mL (09/12/2017 2230)  . sodium chloride 75 mL/hr at 09/19/17 1219  . azithromycin Stopped (09/20/17 1131)  . cefTRIAXone (ROCEPHIN)  IV    . cefTRIAXone (ROCEPHIN)  IV Stopped (09/20/17 0030)  . chlorproMAZINE (THORAZINE) IV    . famotidine (PEPCID) IV     PRN Meds:.sodium chloride, sodium chloride, acetaminophen **OR** acetaminophen, antiseptic oral rinse, chlorpheniramine-HYDROcodone, chlorproMAZINE (THORAZINE) IV, diphenhydrAMINE, glycopyrrolate **OR** glycopyrrolate **OR** glycopyrrolate, haloperidol **OR** haloperidol **OR** haloperidol lactate, ipratropium-albuterol, LORazepam **OR** LORazepam **OR** LORazepam, ondansetron **OR** ondansetron (ZOFRAN) IV, polyvinyl alcohol, sodium chloride flush, sodium chloride flush, traMADol Medications Prior to Admission:  Prior to Admission medications   Medication Sig Start Date End Date Taking? Authorizing Provider  acetaminophen  (TYLENOL 8 HOUR) 650 MG CR tablet Take 1,300 mg by mouth every 8 (eight) hours as needed for pain.   Yes [provider]  apixaban (ELIQUIS) 5 MG TABS tablet Take 2.5 mg by mouth 2 (two) times daily.  09/04/15  Yes [provider]  atorvastatin (LIPITOR) 40 MG tablet TAKE 1 TABLET BY MOUTH ONCE DAILY FOR CHOLESTEROL 07/28/17  Yes Ryan Pinto, MD  clobetasol ointment (TEMOVATE) 0.05 % Apply to eczema rash 2-3 x/da 12/31/16  Yes Ryan Pinto, MD  Flaxseed, Linseed, (FLAXSEED OIL) 1000 MG CAPS Take 1,000 mg by mouth  daily.   Yes [provider]  guaiFENesin (MUCINEX) 600 MG 12 hr tablet Take 1,200 mg by mouth 2 (two) times daily.     Yes [provider]  metFORMIN (GLUCOPHAGE XR) 500 MG 24 hr tablet Take 2 tablets 2 / day for Diabetes 09/10/16 09/13/2017 Yes Ryan Pinto, MD  Multiple Vitamin (MULTIVITAMIN WITH MINERALS) TABS tablet Take 1 tablet by mouth daily.   Yes [provider]  Omega-3 Fatty Acids (FISH OIL) 1000 MG CAPS Take by mouth daily.   Yes [provider]  [place] Patient takes OTC folic acid tablet daily.   Yes [provider]  promethazine-dextromethorphan (PROMETHAZINE-DM) 6.25-15 MG/5ML syrup Take 1 to 2 tsp enery 4 hours if needed for cough 04/30/17  Yes Ryan Pinto, MD  triamcinolone ointment (KENALOG) 0.1 % Apply 1 application topically 2 (two) times daily. 04/22/17  Yes Vicie Mutters, PA-C  atenolol (TENORMIN) 50 MG tablet Take 1 tablet daily for BP 09/10/16 09/10/17  Ryan Pinto, MD  budesonide-formoterol [facility]) 160-4.5 MCG/ACT inhaler Inhale 2 puffs into the lungs 2 (two) times daily. Patient not taking: Reported on 09/19/2017 09/05/15   Forcucci, Loma Sousa, PA-C  glucose blood (ONE TOUCH ULTRA TEST) test strip Check blood sugar 1 time a day. Dx: E11.9 04/14/17   Vicie Mutters, PA-C  tiotropium (SPIRIVA) 18 MCG inhalation capsule Place 1 capsule (18 mcg total) into inhaler and  inhale daily. Patient not taking: Reported on 09/17/2017 03/26/16   Forcucci, Loma Sousa, PA-C  traMADol (ULTRAM) 50 MG tablet Take 2 tablets (100 mg total) by mouth every 6 (six) hours as needed. Patient not taking: Reported on 09/23/2017 03/26/16   Starlyn Skeans, PA-C   Allergies  Allergen Reactions  . Crestor [Rosuvastatin]   . Latex Itching  . Neomycin-Bacitracin Zn-Polymyx Other (See Comments)    Causes blisters  . Ciprofloxacin Rash   Review of Systems  Unable to perform ROS: Mental status change   Physical Exam  Constitutional: He appears lethargic. He appears ill.  HENT:  Head: Normocephalic and atraumatic.  Pulmonary/Chest: No accessory muscle usage. No tachypnea. No respiratory distress.  Comfortable on BiPAP  Neurological: He appears lethargic.  Skin: Skin is warm and dry.  Psychiatric: He is noncommunicative. He is inattentive.  Nursing note and vitals reviewed.  Vital Signs: BP 123/62 (BP Location: Right Arm)   Pulse 95   Temp 98.5 F (36.9 C) (Axillary)   Resp 17   Ht '5\' 11"'$  (1.803 m)   Wt 80.7 kg (178 lb)   SpO2 100%   BMI 24.83 kg/m  Pain Scale: CPOT   Pain Score: Asleep   SpO2: SpO2: 100 % O2 Device:SpO2: 100 % O2 Flow Rate: .O2 Flow Rate (L/min): 5 L/min  IO: Intake/output summary:   Intake/Output Summary (Last 24 hours) at 09/20/2017 1540 Last data filed at 09/20/2017 1300 Gross per 24 hour  Intake 2093 ml  Output 1525 ml  Net 568 ml    LBM: Last BM Date: 09/17/17 Baseline Weight: Weight: 82.3 kg (181 lb 7 oz) Most recent weight: Weight: 80.7 kg (178 lb)     Palliative Assessment/Data: PPS 20%   Flowsheet Rows     Most Recent Value  Intake Tab  Referral Department  Hospitalist  Unit at Time of Referral  Intermediate Care Unit  Palliative Care Primary Diagnosis  Pulmonary  Palliative Care Type  New Palliative care  Reason for referral  Clarify Goals of Care  Date first seen by Palliative Care  09/20/17  Clinical Assessment    Palliative Performance Scale Score  20%  Psychosocial & Spiritual Assessment  Palliative Care Outcomes  Patient/Family meeting held?  Yes  Who was at the meeting?  daughter, son-in-law  Palliative Care Outcomes  Clarified goals of care, Counseled regarding hospice, Provided end of life care assistance, ACP counseling assistance, Provided psychosocial or spiritual support      Time In: 1400 Time Out: 1520 Time Total: 85mn Greater than 50%  of this time was spent counseling and coordinating care related to the above assessment and plan.  Signed by:  MIhor Dow FNP-C Palliative Medicine Team  Phone: [phone]: [phone]  Please contact Palliative Medicine Team phone at [phone] questions and concerns.  For individual provider: See AShea Evans

## 2017-09-21 DIAGNOSIS — I10 Essential (primary) hypertension: Secondary | ICD-10-CM

## 2017-09-21 DIAGNOSIS — R0602 Shortness of breath: Secondary | ICD-10-CM

## 2017-09-21 DIAGNOSIS — R6520 Severe sepsis without septic shock: Secondary | ICD-10-CM

## 2017-09-21 DIAGNOSIS — E1121 Type 2 diabetes mellitus with diabetic nephropathy: Secondary | ICD-10-CM

## 2017-09-21 DIAGNOSIS — Z515 Encounter for palliative care: Secondary | ICD-10-CM

## 2017-09-21 DIAGNOSIS — A419 Sepsis, unspecified organism: Principal | ICD-10-CM

## 2017-09-21 DIAGNOSIS — I4891 Unspecified atrial fibrillation: Secondary | ICD-10-CM

## 2017-09-21 LAB — BLOOD GAS, ARTERIAL
Acid-base deficit: 2.1 mmol/L — ABNORMAL HIGH (ref 0.0–2.0)
BICARBONATE: 25.2 mmol/L (ref 20.0–28.0)
DELIVERY SYSTEMS: POSITIVE
Drawn by: 24513
EXPIRATORY PAP: 4
FIO2: 40
INSPIRATORY PAP: 18
O2 Saturation: 97.3 %
PATIENT TEMPERATURE: 98.6
pCO2 arterial: 69.5 mmHg (ref 32.0–48.0)
pH, Arterial: 7.185 — CL (ref 7.350–7.450)
pO2, Arterial: 103 mmHg (ref 83.0–108.0)

## 2017-09-21 LAB — CBC
HCT: 46.2 % (ref 39.0–52.0)
Hemoglobin: 13.8 g/dL (ref 13.0–17.0)
MCH: 27.7 pg (ref 26.0–34.0)
MCHC: 29.9 g/dL — AB (ref 30.0–36.0)
MCV: 92.6 fL (ref 78.0–100.0)
PLATELETS: 379 10*3/uL (ref 150–400)
RBC: 4.99 MIL/uL (ref 4.22–5.81)
RDW: 13.2 % (ref 11.5–15.5)
WBC: 18.8 10*3/uL — AB (ref 4.0–10.5)

## 2017-09-21 LAB — GLUCOSE, CAPILLARY: Glucose-Capillary: 200 mg/dL — ABNORMAL HIGH (ref 65–99)

## 2017-09-21 LAB — BASIC METABOLIC PANEL
Anion gap: 12 (ref 5–15)
BUN: 40 mg/dL — AB (ref 6–20)
CALCIUM: 9.4 mg/dL (ref 8.9–10.3)
CO2: 26 mmol/L (ref 22–32)
CREATININE: 0.98 mg/dL (ref 0.61–1.24)
Chloride: 106 mmol/L (ref 101–111)
GFR calc Af Amer: >60 mL/min (ref >60)
GFR calc non Af Amer: >60 mL/min (ref >60)
GLUCOSE: 170 mg/dL — AB (ref 65–99)
Potassium: 6 mmol/L — ABNORMAL HIGH (ref 3.5–5.1)
Sodium: 144 mmol/L (ref 135–145)

## 2017-09-21 MED ORDER — SODIUM CHLORIDE 0.9 % IV SOLN
1.0000 g | Freq: Once | INTRAVENOUS | Status: AC
  Administered 2017-09-21: 1 g via INTRAVENOUS
  Filled 2017-09-21 (×2): qty 10

## 2017-09-21 MED ORDER — LORAZEPAM 2 MG/ML IJ SOLN
1.0000 mg | Freq: Once | INTRAMUSCULAR | Status: AC
  Administered 2017-09-21: 1 mg via INTRAVENOUS
  Filled 2017-09-21: qty 1

## 2017-09-21 MED ORDER — INSULIN ASPART 100 UNIT/ML IV SOLN
10.0000 [IU] | Freq: Once | INTRAVENOUS | Status: AC
  Administered 2017-09-21: 10 [IU] via INTRAVENOUS

## 2017-09-21 MED ORDER — MORPHINE SULFATE (PF) 2 MG/ML IV SOLN
2.0000 mg | INTRAVENOUS | Status: DC | PRN
  Administered 2017-09-21 – 2017-09-22 (×4): 4 mg via INTRAVENOUS
  Administered 2017-09-22: 3 mg via INTRAVENOUS
  Filled 2017-09-21 (×4): qty 2
  Filled 2017-09-21: qty 1
  Filled 2017-09-21: qty 2

## 2017-09-21 MED ORDER — MORPHINE SULFATE (PF) 2 MG/ML IV SOLN
1.0000 mg | Freq: Four times a day (QID) | INTRAVENOUS | Status: DC
  Administered 2017-09-22: 1 mg via INTRAVENOUS
  Filled 2017-09-21: qty 1

## 2017-09-21 MED ORDER — CALCIUM GLUCONATE 10 % IV SOLN: 1.0000 g | Freq: Once | INTRAVENOUS | Status: DC

## 2017-09-21 MED ORDER — MORPHINE SULFATE (PF) 4 MG/ML IV SOLN
4.0000 mg | Freq: Once | INTRAVENOUS | Status: AC
  Administered 2017-09-21: 4 mg via INTRAVENOUS
  Filled 2017-09-21: qty 1

## 2017-09-21 MED ORDER — DEXTROSE 50 % IV SOLN
1.0000 | Freq: Once | INTRAVENOUS | Status: AC
  Administered 2017-09-21: 50 mL via INTRAVENOUS
  Filled 2017-09-21: qty 50

## 2017-09-21 NOTE — Progress Notes (Signed)
PROGRESS NOTE  [name] BOF:[id number] DOB: 1941-04-03 DOA: 09/09/2017 PCP: Unk Pinto, MD   LOS: 3 days   Brief Narrative / Interim history: 77 year old male with type 2 diabetes, COPD, A. fib on Eliquis was admitted to the hospital with hypoxic respiratory failure in the setting of postobstructive pneumonia with a large right upper lobar mass.  He has been followed by pulmonary as an outpatient, he refused further work-up for his progressive mass  Assessment & Plan: Principal Problem:   Acute respiratory failure ([place]) Active Problems:   T2_NIDDM w/ CKD   Hyperlipidemia   Essential hypertension   Mass of upper lobe of right lung   Goals of care, counseling/discussion   Atrial fibrillation (HCC)   Severe chronic obstructive pulmonary disease ([place])   Community acquired pneumonia of right lower lobe of lung ([place])   Palliative care by specialist   Acute respiratory failure with hypoxia and hypercapnia with respiratory acidosis /community-acquired pneumonia/postobstructive pneumonia -More hypoxic and acidotic overnight, currently on BiPAP and unresponsive this morning -He is DNR, palliative following, may die in the hospital -Continue antibiotics and steroids  Sepsis secondary to postobstructive pneumonia/community acquired pneumonia -Sputum cultures growing Haemophilus, continue antibiotics -1 out of 4 bottles blood culture growing coag negative staph, likely contaminant  Right upper lobe lung mass -Going back as far as 2012, followed by pulmonology in [place] he apparently refused further work-up in the past, he refused bronchoscopy on biopsy.  Appears to be progressive, Dr. Roseanne Kaufman discussed with Dr. Lisbeth Renshaw from radiation therapy, not an option for such a large mass with unknown pathology -Continue ongoing palliative care discussions  A. 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  Hypertension -Resume atenolol when able to take oral   DVT prophylaxis: Lovenox Code Status: DNR Family Communication: family at bedside Disposition Plan: TBD  Consultants:   Palliative care  Procedures:   BiPAP  Antimicrobials:  Ceftriaxone/azithromycin 2/25 >>  Subjective: -Unresponsive, occasionally grimaces but does not answer any questions  Objective: Vitals:   09/21/17 0100 09/21/17 0313 09/21/17 0727 09/21/17 0803  BP: 110/66 131/72 131/75 129/77  Pulse: (!) 103 (!) 126 (!) 101 (!) 108  Resp: 18 20 12 20   Temp:  98.7 F (37.1 C)    TempSrc:  Axillary    SpO2: 98% 96% 98% 100%  Weight:      Height:        Intake/Output Summary (Last 24 hours) at 09/21/2017 1116 Last data filed at 09/21/2017 0856 Gross per 24 hour  Intake 2536.67 ml  Output 1625 ml  Net 911.67 ml   Filed Weights   08/26/2017 2053 09/19/17 0400  Weight: 82.3 kg (181 lb 7 oz) 80.7 kg (178 lb)    Examination:  Constitutional: Unresponsive ENMT: Mucous membranes are dry Neck: normal, supple Respiratory: Coarse breath sounds bilaterally, no wheezing, shallow breathing no crackles.  Cardiovascular: irregular, no murmurs / rubs / gallops. No LE edema. Abdomen: soft, BS + Musculoskeletal: no clubbing / cyanosis. Skin: no rashes Neurologic: unresponsive.  Spontaneously moves all 4 extremities   Data Reviewed: I have independently reviewed following labs and imaging studies   CBC: Recent Labs  Lab 08/28/2017 1350 09/01/2017 1419 09/19/17 0219 09/20/17 0301 09/21/17 0222  WBC 40.9*  --  40.5* 28.3* 18.8*  NEUTROABS 34.4*  --  32.8*  --   --   HGB 15.9 17.0 14.0 13.5 13.8  HCT 49.9 50.0 45.7 45.3 46.2  MCV 86.6  --  89.8 93.0 92.6  PLT 456*  --  399 361  854   Basic Metabolic Panel: Recent Labs  Lab 08/25/2017 1350 09/03/2017 1419 09/19/17 0219 09/20/17 0301 09/21/17 0222  NA 138 137 140 142 144  K 5.1 5.6* 5.4* 5.4* 6.0*  CL 98* 101 106 106 106  CO2 26  --  24 29 26   GLUCOSE 181* 176* 123* 114* 170*  BUN 31* 45* 35* 35* 40*  CREATININE 1.28* 1.00 1.11 0.97 0.98  CALCIUM 10.5*  --  9.1 9.5 9.4   GFR: Estimated Creatinine Clearance: 65.1 mL/min (by C-G formula based on SCr of 0.98 mg/dL). Liver Function Tests: Recent Labs  Lab 09/06/2017 1350  AST 23  ALT 25  ALKPHOS 80  BILITOT 1.3*  PROT 8.4*  ALBUMIN 4.0   No results for input(s): LIPASE, AMYLASE in the last 168 hours. No results for input(s): AMMONIA in the last 168 hours. Coagulation Profile: Recent Labs  Lab 09/24/2017 1350  INR 1.12   Cardiac Enzymes: No results for input(s): CKTOTAL, CKMB, CKMBINDEX, TROPONINI in the last 168 hours. BNP (last 3 results) No results for input(s): PROBNP in the last 8760 hours. HbA1C: No results for input(s): HGBA1C in the last 72 hours. CBG: Recent Labs  Lab 09/20/17 0746 09/20/17 1154 09/20/17 1636 09/20/17 2115 09/21/17 0756  GLUCAP 113* 118* 125* 140* 200*   Lipid Profile: No results for input(s): CHOL, HDL, LDLCALC, TRIG, CHOLHDL, LDLDIRECT in the last 72 hours. Thyroid Function Tests: No results for input(s): TSH, T4TOTAL, FREET4, T3FREE, THYROIDAB in the last 72 hours. Anemia Panel: No results for input(s): VITAMINB12, FOLATE, FERRITIN, TIBC, IRON, RETICCTPCT in the last 72 hours. Urine analysis:    Component Value Date/Time   COLORURINE DARK YELLOW 04/22/2017 0915   APPEARANCEUR CLEAR 04/22/2017 0915   LABSPEC 1.021 04/22/2017 0915   PHURINE 5.5 04/22/2017 0915   GLUCOSEU NEGATIVE 04/22/2017 0915   HGBUR NEGATIVE 04/22/2017 0915   BILIRUBINUR NEGATIVE 12/05/2015 1057   KETONESUR NEGATIVE 04/22/2017 0915   PROTEINUR 2+ (A) 04/22/2017 0915   UROBILINOGEN 0.2 11/13/2014 1119   NITRITE NEGATIVE 04/22/2017  0915   LEUKOCYTESUR NEGATIVE 04/22/2017 0915   Sepsis Labs: Invalid input(s): PROCALCITONIN, LACTICIDVEN  Recent Results (from the past 240 hour(s))  Blood culture (routine x 2)     Status: None (Preliminary result)   Collection Time: 09/16/2017  1:42 PM  Result Value Ref Range Status   Specimen Description BLOOD LEFT ANTECUBITAL  Final   Special Requests   Final    BOTTLES DRAWN AEROBIC AND ANAEROBIC Blood Culture results may not be optimal due to an inadequate volume of blood received in culture bottles   Culture   Final    NO GROWTH 2 DAYS Performed at [facility] [address], [place], [place] [address]    Report Status PENDING  Incomplete  Blood culture (routine x 2)     Status: Abnormal (Preliminary result)   Collection Time: 09/08/2017  3:45 PM  Result Value Ref Range Status   Specimen Description BLOOD RIGHT ANTECUBITAL  Final   Special Requests   Final    BOTTLES DRAWN AEROBIC AND ANAEROBIC Blood Culture adequate volume   Culture  Setup Time   Final    GRAM POSITIVE COCCI AEROBIC BOTTLE ONLY  CRITICAL RESULT CALLED TO, READ BACK BY AND VERIFIED WITH: A. MASTERS PHARMD, AT 1556 09/19/17 BY D. VANHOOK    Culture (A)  Final    STAPHYLOCOCCUS SPECIES (COAGULASE NEGATIVE) THE SIGNIFICANCE OF ISOLATING THIS ORGANISM FROM A SINGLE VENIPUNCTURE CANNOT BE PREDICTED WITHOUT FURTHER CLINICAL AND CULTURE CORRELATION. SUSCEPTIBILITIES AVAILABLE ONLY ON REQUEST. Performed at [facility], [place] [address], [place], [place] [address]    Report Status PENDING  Incomplete  Blood Culture ID Panel (Reflexed)     Status: Abnormal   Collection Time: 08/30/2017  3:45 PM  Result Value Ref Range Status   Enterococcus species NOT DETECTED NOT DETECTED Final   Listeria monocytogenes NOT DETECTED NOT DETECTED Final   Staphylococcus species DETECTED (A) NOT DETECTED Final    Comment: Methicillin (oxacillin) susceptible coagulase negative staphylococcus. Possible blood culture contaminant  (unless isolated from more than one blood culture draw or clinical case suggests pathogenicity). No antibiotic treatment is indicated for blood  culture contaminants. CRITICAL RESULT CALLED TO, READ BACK BY AND VERIFIED WITH: A. MASTERS PHARMD, AT 1556 09/19/17 BY D. VANHOOK    Staphylococcus aureus NOT DETECTED NOT DETECTED Final   Methicillin resistance NOT DETECTED NOT DETECTED Final   Streptococcus species NOT DETECTED NOT DETECTED Final   Streptococcus agalactiae NOT DETECTED NOT DETECTED Final   Streptococcus pneumoniae NOT DETECTED NOT DETECTED Final   Streptococcus pyogenes NOT DETECTED NOT DETECTED Final   Acinetobacter baumannii NOT DETECTED NOT DETECTED Final   Enterobacteriaceae species NOT DETECTED NOT DETECTED Final   Enterobacter cloacae complex NOT DETECTED NOT DETECTED Final   Escherichia coli NOT DETECTED NOT DETECTED Final   Klebsiella oxytoca NOT DETECTED NOT DETECTED Final   Klebsiella pneumoniae NOT DETECTED NOT DETECTED Final   Proteus species NOT DETECTED NOT DETECTED Final   Serratia marcescens NOT DETECTED NOT DETECTED Final   Haemophilus influenzae NOT DETECTED NOT DETECTED Final   Neisseria meningitidis NOT DETECTED NOT DETECTED Final   Pseudomonas aeruginosa NOT DETECTED NOT DETECTED Final   Candida albicans NOT DETECTED NOT DETECTED Final   Candida glabrata NOT DETECTED NOT DETECTED Final   Candida krusei NOT DETECTED NOT DETECTED Final   Candida parapsilosis NOT DETECTED NOT DETECTED Final   Candida tropicalis NOT DETECTED NOT DETECTED Final    Comment: Performed at [facility], [address], [place], [place] [address]  Culture, sputum-assessment     Status: None   Collection Time: 08/27/2017  4:25 PM  Result Value Ref Range Status   Specimen Description EXPECTORATED SPUTUM  Final   Special Requests NONE  Final   Sputum evaluation   Final    THIS SPECIMEN IS ACCEPTABLE FOR SPUTUM CULTURE Performed at [facility], [address]  [place], [place] [address]    Report Status 09/16/2017 FINAL  Final  Culture, respiratory (NON-Expectorated)     Status: None   Collection Time: 09/04/2017  4:25 PM  Result Value Ref Range Status   Specimen Description EXPECTORATED SPUTUM  Final   Special Requests NONE Reflexed from [id number]  Final   Gram Stain   Final    MODERATE WBC PRESENT, PREDOMINANTLY PMN RARE SQUAMOUS EPITHELIAL CELLS PRESENT ABUNDANT GRAM NEGATIVE COCCOBACILLI FEW GRAM POSITIVE COCCI IN PAIRS    Culture   Final    ABUNDANT HAEMOPHILUS INFLUENZAE BETA LACTAMASE NEGATIVE Performed at [facility], [place] [address] [place], [place] [address]    Report Status 09/20/2017 FINAL  Final  Respiratory Panel by PCR  Status: None   Collection Time: 09/09/2017  5:15 PM  Result Value Ref Range Status   Adenovirus NOT DETECTED NOT DETECTED Final   Coronavirus 229E NOT DETECTED NOT DETECTED Final   Coronavirus HKU1 NOT DETECTED NOT DETECTED Final   Coronavirus NL63 NOT DETECTED NOT DETECTED Final   Coronavirus OC43 NOT DETECTED NOT DETECTED Final   Metapneumovirus NOT DETECTED NOT DETECTED Final   Rhinovirus / Enterovirus NOT DETECTED NOT DETECTED Final   Influenza A NOT DETECTED NOT DETECTED Final   Influenza B NOT DETECTED NOT DETECTED Final   Parainfluenza Virus 1 NOT DETECTED NOT DETECTED Final   Parainfluenza Virus 2 NOT DETECTED NOT DETECTED Final   Parainfluenza Virus 3 NOT DETECTED NOT DETECTED Final   Parainfluenza Virus 4 NOT DETECTED NOT DETECTED Final   Respiratory Syncytial Virus NOT DETECTED NOT DETECTED Final   Bordetella pertussis NOT DETECTED NOT DETECTED Final   Chlamydophila pneumoniae NOT DETECTED NOT DETECTED Final   Mycoplasma pneumoniae NOT DETECTED NOT DETECTED Final    Comment: Performed at [facility], [place] [address], [place], [place] [address]  MRSA PCR Screening     Status: None   Collection Time: 09/10/2017  9:08 PM  Result Value Ref Range Status   MRSA by PCR NEGATIVE NEGATIVE Final     Comment:        The GeneXpert MRSA Assay (FDA approved for NASAL specimens only), is one component of a comprehensive MRSA colonization surveillance program. It is not intended to diagnose MRSA infection nor to guide or monitor treatment for MRSA infections. Performed at [facility], [place] [address] [place],  [address]       Radiology Studies: No results found.   Scheduled Meds: . acetaminophen  650 mg Oral Q8H  . atorvastatin  40 mg Oral q1800  . chlorhexidine  15 mL Mouth Rinse BID  . clobetasol ointment   Topical BID  . enoxaparin (LOVENOX) injection  1 mg/kg Subcutaneous Q12H  . guaiFENesin  1,200 mg Oral BID  . insulin aspart  0-15 Units Subcutaneous TID WC  . mouth rinse  15 mL Mouth Rinse q12n4p  . methylPREDNISolone (SOLU-MEDROL) injection  80 mg Intravenous Q8H  . metoprolol tartrate  5 mg Intravenous Q6H  . mometasone-formoterol  2 puff Inhalation BID  . multivitamin with minerals  1 tablet Oral Daily  . omega-3 acid ethyl esters  1 g Oral Daily  . sodium chloride flush  3 mL Intravenous Q12H  . sodium polystyrene  30 g Oral Once  . tiotropium  18 mcg Inhalation Daily  . triamcinolone ointment  1 application Topical BID   Continuous Infusions: . sodium chloride    . sodium chloride 250 mL (09/17/2017 2230)  . sodium chloride 75 mL/hr at 09/21/17 0014  . azithromycin 500 mg (09/21/17 0850)  . cefTRIAXone (ROCEPHIN)  IV    . cefTRIAXone (ROCEPHIN)  IV Stopped (09/21/17 0051)  . chlorproMAZINE (THORAZINE) IV    . diltiazem (CARDIZEM) infusion 7.5 mg/hr (09/21/17 0422)  . famotidine (PEPCID) IV Stopped (09/20/17 2138)     Marzetta Board, MD, PhD [facility] Pager [phone]  If 7PM-7AM, please contact night-coverage [email or web address] Password [facility] 09/21/2017, 11:16 AM

## 2017-09-21 NOTE — Progress Notes (Signed)
Family back at bedside. Administered Morphine and Ativan per palliative orders. Called respiratory to remove bipap equipment from patient and room.  Removed monitor and contacted CCM.  Pt currently on [place] 2L and is  full comfort care.  Morphine and Ativan PRN. Pt is unresponsive, but no facial grimace or agitation seen.

## 2017-09-21 NOTE — Progress Notes (Addendum)
Pt's encephalopathy getting worse per RN. Pt on BIPAP. ABG done which is worse in re: hypercarbia. NP to room. Pt is not even responsive to pain now. Cardizem started because pt in Afib with RVR, non responsive to Metoprolol. Called daughter to give update. Told daughter that since pt is not going to be intubated, BIPAP was last resort. Also, updated on HR.  KJKG, NP Update: r/p ABG only slightly improved. Cont BIPAP for now. K is 6.0 this am, treating that.  KJKG, NP [facility] Performed by: Clance Boll   Total critical care time: 30 minutes  Critical care time was exclusive of separately billable procedures and treating other patients.  Critical care was necessary to treat or prevent imminent or life-threatening deterioration.  Critical care was time spent personally by me on the following activities: development of treatment plan with patient and/or surrogate as well as nursing, discussions with consultants, evaluation of patient's response to treatment, examination of patient, obtaining history from patient or surrogate, ordering and performing treatments and interventions, ordering and review of laboratory studies, ordering and review of radiographic studies, pulse oximetry and re-evaluation of patient's condition.

## 2017-09-21 NOTE — Progress Notes (Signed)
SLP Cancellation Note  Patient Details Name: [name] MRN: [id number] DOB: March 21, 1941   Cancelled treatment:       Reason Eval/Treat Not Completed: Medical issues which prohibited therapy. Pt continues to require BiPAP. Will continue efforts.  Lizett Chowning B. Quentin O[facility], CCC-SLP Speech Language Pathologist [phone]  Shonna Chock 09/21/2017, 9:10 AM

## 2017-09-21 NOTE — Plan of Care (Signed)
Pt is end of life care

## 2017-09-21 NOTE — Progress Notes (Signed)
CRITICAL VALUE ALERT  Critical Value:  ABG results: PH [id number].[id number]  Date & Time Notied:  09/20/17 2350  Provider Notified:  TRH: Baltazar Najjar, NP notified Orders Received/Actions taken: Provider at bedside

## 2017-09-21 NOTE — Progress Notes (Addendum)
Daily Progress Note   Patient Name: [name]       Date: 09/21/2017 DOB: 22-Dec-1940  Age: 77 y.o. MRN#: [id number] Attending Physician: Caren Griffins, MD Primary Care Physician: Unk Pinto, MD Admit Date: 09/14/2017  Reason for Consultation/Follow-up: Establishing goals of care  Subjective: Patient lethargic on BiPAP. Grimaces to pain or when repositioned. Otherwise, nonverbal and not following commands.   GOC:   7253: Follow-up with daughter [name]) and son-in-law [name]) this morning. Discussed clinical decline over night with agitation and worsening ABG results. Discussed diagnoses, interventions, and poor prognosis. [name] struggles with timing. She doesn't want to "prolong" the inevitable but also speaks of the shock of his decline in just a few days. Answered questions and concerns. Reassured I would follow-up this afternoon.   1210: Follow-up with family members at bedside including daughter, son-in-law, and sister [name]). Discussed at length hospital diagnoses and interventions. Discussed goals of care including patient wishes. [name] confirms that her brother would NOT want resuscitation, life support, or heroic/prolonging measures if he was not going to get better. The daughter remains saddened by the fact that her father did not pursue biopsy or further oncology workup years ago. I praised her for being such a great advocate and trying to get him to pursue workup. Family again speaks of his stubborn nature.   Family asks about next steps and timing. Educated on transition to comfort focused care with goal of comfort and quality at EOL. Educated on medications to ensure comfort and relief from suffering. Explained process of pre-medicating with comfort meds and then removing  BiPAP. Educated on EOL expectations and s/s of distress to be aware of. In regards to timing, I explained the decision is up to them. Originally had planned for BiPAP for 24-48 hours, but that he unfortunately is not showing improvement despite aggressive attempts with ABX, steroids, and BIPAP. Family understands prognosis of likely hours-days once BiPAP is removed.   Answered questions concerns. Emotional support provided. Family declines chaplain services.   1545: Family is ready for transition to comfort measures. RN pre-medicated with Morphine 4mg  IV x1 and Ativan 1mg  IV x1. BiPAP and cardiac monitor removed. Visited with family at bedside. Patient resting comfortably with shallow, regular respirations. Educated again on goal of symptom management to relieve suffering and EOL expectations. Emotional  support provided. Therapeutic listening as family shared stories throughout the day of [name].    Length of Stay: 3  Current Medications: Scheduled Meds:  . acetaminophen  650 mg Oral Q8H  . atorvastatin  40 mg Oral q1800  . chlorhexidine  15 mL Mouth Rinse BID  . clobetasol ointment   Topical BID  . enoxaparin (LOVENOX) injection  1 mg/kg Subcutaneous Q12H  . guaiFENesin  1,200 mg Oral BID  . insulin aspart  0-15 Units Subcutaneous TID WC  . mouth rinse  15 mL Mouth Rinse q12n4p  . methylPREDNISolone (SOLU-MEDROL) injection  80 mg Intravenous Q8H  . metoprolol tartrate  5 mg Intravenous Q6H  . mometasone-formoterol  2 puff Inhalation BID  . multivitamin with minerals  1 tablet Oral Daily  . omega-3 acid ethyl esters  1 g Oral Daily  . sodium chloride flush  3 mL Intravenous Q12H  . sodium polystyrene  30 g Oral Once  . tiotropium  18 mcg Inhalation Daily  . triamcinolone ointment  1 application Topical BID    Continuous Infusions: . sodium chloride    . sodium chloride 250 mL (09/09/2017 2230)  . sodium chloride 75 mL/hr at 09/21/17 0014  . azithromycin 500 mg (09/21/17 0850)  .  cefTRIAXone (ROCEPHIN)  IV    . cefTRIAXone (ROCEPHIN)  IV Stopped (09/21/17 0051)  . chlorproMAZINE (THORAZINE) IV    . diltiazem (CARDIZEM) infusion 7.5 mg/hr (09/21/17 0422)  . famotidine (PEPCID) IV Stopped (09/20/17 2138)    PRN Meds: sodium chloride, sodium chloride, acetaminophen **OR** acetaminophen, antiseptic oral rinse, chlorpheniramine-HYDROcodone, chlorproMAZINE (THORAZINE) IV, diphenhydrAMINE, glycopyrrolate **OR** glycopyrrolate **OR** glycopyrrolate, haloperidol **OR** haloperidol **OR** haloperidol lactate, ipratropium-albuterol, LORazepam **OR** LORazepam **OR** LORazepam, ondansetron **OR** ondansetron (ZOFRAN) IV, polyvinyl alcohol, sodium chloride flush, sodium chloride flush, traMADol  Physical Exam  Constitutional: He appears lethargic. He appears ill.  HENT:  Head: Normocephalic and atraumatic.  Pulmonary/Chest: No accessory muscle usage. No tachypnea. No respiratory distress.  BiPAP  Neurological: He appears lethargic.  Skin: Skin is warm and dry.  Psychiatric: He is noncommunicative. He is inattentive.  Nursing note and vitals reviewed.          Vital Signs: BP 129/77   Pulse (!) 108   Temp 98.7 F (37.1 C) (Axillary)   Resp 20   Ht 5\' 11"  (1.803 m)   Wt 80.7 kg (178 lb)   SpO2 100%   BMI 24.83 kg/m  SpO2: SpO2: 100 % O2 Device: O2 Device: Bi-PAP O2 Flow Rate: O2 Flow Rate (L/min): 5 L/min  Intake/output summary:   Intake/Output Summary (Last 24 hours) at 09/21/2017 1018 Last data filed at 09/21/2017 7824 Gross per 24 hour  Intake 2536.67 ml  Output 1625 ml  Net 911.67 ml   LBM: Last BM Date: 09/17/17 Baseline Weight: Weight: 82.3 kg (181 lb 7 oz) Most recent weight: Weight: 80.7 kg (178 lb)       Palliative Assessment/Data: PPS 20%   Flowsheet Rows     Most Recent Value  Intake Tab  Referral Department  Hospitalist  Unit at Time of Referral  Intermediate Care Unit  Palliative Care Primary Diagnosis  Pulmonary  Palliative Care Type   New Palliative care  Reason for referral  Clarify Goals of Care  Date first seen by Palliative Care  09/20/17  Clinical Assessment  Palliative Performance Scale Score  20%  Psychosocial & Spiritual Assessment  Palliative Care Outcomes  Patient/Family meeting held?  Yes  Who was at the meeting?  daughter, son-in-law  Palliative Care Outcomes  Clarified goals of care, Counseled regarding hospice, Provided end of life care assistance, ACP counseling assistance, Provided psychosocial or spiritual support      Patient Active Problem List   Diagnosis Date Noted  . Palliative care by specialist   . Acute respiratory failure ([place]) 09/04/2017  . Community acquired pneumonia of right lower lobe of lung ([place]) 09/15/2017  . Hearing loss 11/13/2014  . Atrial fibrillation ([place]) 11/13/2014  . At high risk for falls 11/13/2014  . Goals of care, counseling/discussion 11/07/2014  . Legal blindness -macular degeneration with retinopathy 08/06/2014  . Severe chronic obstructive pulmonary disease ([place]) 10/26/2013  . Mass of upper lobe of right lung 03/21/2013  . Essential hypertension 03/09/2013  . Vitamin D deficiency 03/09/2013  . Medication management 03/09/2013  . Melanoma of skin ([place]) 12/28/2012  . History of splenectomy-for hereditary spherocytosis 12/28/2012  . 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 Hyperlipidemia 10/03/2008  . BMI 25.0-25.9,adult 10/03/2008  . BPH/prostatism 10/03/2008    Palliative Care Assessment & Plan   Patient Profile: 77 y.o. male  with past medical history of hypertension, hypercholesteremia, COPD, DM, afib on coumadin, macular degeneration, and right lung mass diagnosed in 2012 admitted on 09/21/2017 with shortness of breath and weakness. ED workup signification for postobstructive pneumonia with large right upper lobe mass wrapping around right mainstem bronchus. Followed by outpatient pulmonology for right lung mass that he refused workup for. Attending has spoke with Dr.  Lisbeth Renshaw regarding radiation, which is not an option due to unknown pathology. On 5/27, patient with increased lethargy. ABG revealed CO2 65. Patient receiving IV antibiotics and steroids. Placed on BiPAP. Palliative medicine consultation for goals of care.   Assessment: Acute respiratory failure with hypoxia and hypercapnia Sepsis Postobstructive pneumonia/community-acquired pneumonia RUL lung mass   Acute encephalopathy Afib RVR Severe COPD with exacerbation  Recommendations/Plan:  After extensive conversations with family, transition to comfort measures only. DNR/DNI.  Discontinued labs/interventions/medications not aimed at comfort.   RN to give Morphine 4mg  IV x1 and Ativan 1mg  IV x1 prior to removing BIPAP.  Symptom management  Morphine 1mg  q6h scheduled starting at 2000.  Morphine 2-4mg  IV q17min prn pain/dyspnea/air hunger  Ativan 1mg  IV q4h prn anxiety/agitation  Robinul 0.2mg  IV q4h prn secretions  May require continuous morphine infusion if requiring frequent morphine prn doses.  RN may pronounce. Prognosis likely hours.  Code Status: DNR/DNI   Code Status Orders  (From admission, onward)        Start     Ordered   08/29/2017 1922  Do not attempt resuscitation (DNR)  Continuous    Question Answer Comment  In the event of cardiac or respiratory ARREST Do not call a "code blue"   In the event of cardiac or respiratory ARREST Do not perform Intubation, CPR, defibrillation or ACLS   In the event of cardiac or respiratory ARREST Use medication by any route, position, wound care, and other measures to relive pain and suffering. May use oxygen, suction and manual treatment of airway obstruction as needed for comfort.      09/19/2017 1924    Code Status History    Date Active Date Inactive Code Status Order ID Comments User Context   08/30/2017 1546 09/24/2017 1924 Full Code [id number]  Lady Deutscher, MD ED       Prognosis:   Hours - Days  Discharge  Planning:  To Be Determined  Care plan was discussed with multiple family members at  bedside including daughter, son-in-law, sister, RN, Dr. Cruzita Lederer  Thank you for allowing the Palliative Medicine Team to assist in the care of this patient.   Time In: 0940- 1210- 1550 Time Out: [phone] Total Time 160 Prolonged Time Billed  yes      Greater than 50%  of this time was spent counseling and coordinating care related to the above assessment and plan.  Ihor Dow, FNP-C Palliative Medicine Team  Phone: [phone] Fax: [phone]  Please contact Palliative Medicine Team phone at [phone] for questions and concerns.

## 2017-09-21 NOTE — Progress Notes (Signed)
CRITICAL VALUE ALERT  Critical Value:  ABG results- pH 7.185 CO2-69.5  Date & Time Notified:  09/21/2017 0340  Provider Notified: TRH-Kirby, NP  Orders Received/Actions taken: No new orders, will continue to monitor.

## 2017-09-21 NOTE — Progress Notes (Signed)
Per family request will hold off on removing bipap until they return and are at beside.

## 2017-09-21 NOTE — Progress Notes (Signed)
Holding meds until palliative advises.  Palliative discussing plan of care with family at bedside.

## 2017-09-22 LAB — CULTURE, BLOOD (ROUTINE X 2): SPECIAL REQUESTS: ADEQUATE

## 2017-09-22 MED ORDER — MORPHINE SULFATE (PF) 2 MG/ML IV SOLN
2.0000 mg | INTRAVENOUS | Status: DC
Start: 2017-09-22 — End: 2017-09-22

## 2017-09-22 MED ORDER — METOPROLOL TARTRATE 5 MG/5ML IV SOLN
5.0000 mg | Freq: Once | INTRAVENOUS | Status: AC
  Administered 2017-09-22: 5 mg via INTRAVENOUS
  Filled 2017-09-22: qty 5

## 2017-09-22 MED ORDER — MORPHINE SULFATE (PF) 2 MG/ML IV SOLN
2.0000 mg | Freq: Four times a day (QID) | INTRAVENOUS | Status: DC
  Administered 2017-09-22: 2 mg via INTRAVENOUS
  Filled 2017-09-22: qty 1

## 2017-09-23 LAB — CULTURE, BLOOD (ROUTINE X 2): CULTURE: NO GROWTH

## 2017-09-25 NOTE — Progress Notes (Signed)
PROGRESS NOTE  [name] FXJ:[id number] DOB: 01-15-1941 DOA: 09/15/2017 PCP: Unk Pinto, MD   LOS: 4 days   Brief Narrative / Interim history: 77 year old male with type 2 diabetes, COPD, A. fib on Eliquis was admitted to the hospital with hypoxic respiratory failure in the setting of postobstructive pneumonia with a large right upper lobar mass.  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   Hyperlipidemia   Essential hypertension   Mass of upper lobe of right lung   Goals of care, counseling/discussion   Atrial fibrillation ([facility])   Severe chronic obstructive pulmonary disease ([place])   Community acquired pneumonia of right lower lobe of lung ([place])   Palliative care by specialist   Terminal care   Shortness of breath   Severe sepsis ([place])   Acute respiratory failure with hypoxia and hypercapnia with respiratory acidosis /community-acquired pneumonia/postobstructive pneumonia -More hypoxic and acidotic overnight, currently on BiPAP and unresponsive this morning -He is DNR, palliative following, now transitioned to comfort care  Sepsis secondary to postobstructive pneumonia/community acquired pneumonia -Sputum cultures growing Haemophilus -1 out of 4 bottles blood culture growing coag negative staph, likely contaminant  Right upper lobe lung mass -Going back as far as 2012, followed by pulmonology in [place] he apparently refused further work-up in the past, he refused bronchoscopy on biopsy.  Appears to be progressive, Dr. Roseanne Kaufman discussed with Dr. Lisbeth Renshaw from radiation therapy, not an option for such a large mass with unknown pathology -now comfort based approach  A. 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  Hyperlipidemia  Hypertension  DVT prophylaxis: Lovenox Code Status: DNR Family Communication: daughter at bedside  Disposition Plan: anticipate in hospital death   Consultants:   Palliative care  Procedures:   BiPAP  Antimicrobials:  Ceftriaxone/azithromycin 2/25 >> 2/28  Subjective: -unresponsive, appears comfortable  Objective: Vitals:   09/21/17 0803 09/21/17 1128 09/21/17 1224 09/21/17 2050  BP: 129/77  111/68 107/86  Pulse: (!) 108 80 80 93  Resp: 20 (!) 22 18 (!) 22  Temp:    97.8 F (36.6 C)  TempSrc:    Axillary  SpO2: 100% 100% 99% 99%  Weight:      Height:        Intake/Output Summary (Last 24 hours) at 25-Sep-2017 0958 Last data filed at 2017/09/25 0926 Gross per 24 hour  Intake 3 ml  Output 800 ml  Net -797 ml   Filed Weights   09/05/2017 2053 09/19/17 0400  Weight: 82.3 kg (181 lb 7 oz) 80.7 kg (178 lb)    Examination:  Constitutional: unresponsive Respiratory: coarse breath sounds Cardiovascular: irregular  Data Reviewed: I have independently reviewed following labs and imaging studies   CBC: Recent Labs  Lab 08/28/2017 1350 09/11/2017 1419 09/19/17 0219 09/20/17 0301 09/21/17 0222  WBC 40.9*  --  40.5* 28.3* 18.8*  NEUTROABS 34.4*  --  32.8*  --   --   HGB 15.9 17.0 14.0 13.5 13.8  HCT 49.9 50.0 45.7 45.3 46.2  MCV 86.6  --  89.8 93.0 92.6  PLT 456*  --  399 361 641   Basic Metabolic Panel: Recent Labs  Lab 09/01/2017 1350 09/11/2017 1419 09/19/17 0219 09/20/17 0301  09/21/17 0222  NA 138 137 140 142 144  K 5.1 5.6* 5.4* 5.4* 6.0*  CL 98* 101 106 106 106  CO2 26  --  24 29 26   GLUCOSE 181* 176* 123* 114* 170*  BUN 31* 45* 35* 35* 40*  CREATININE 1.28* 1.00 1.11 0.97 0.98  CALCIUM 10.5*  --  9.1 9.5 9.4   GFR: Estimated Creatinine Clearance: 65.1 mL/min (by C-G formula based on SCr of 0.98 mg/dL). Liver Function Tests: Recent Labs  Lab 08/29/2017 1350  AST 23    ALT 25  ALKPHOS 80  BILITOT 1.3*  PROT 8.4*  ALBUMIN 4.0   No results for input(s): LIPASE, AMYLASE in the last 168 hours. No results for input(s): AMMONIA in the last 168 hours. Coagulation Profile: Recent Labs  Lab 09/08/2017 1350  INR 1.12   Cardiac Enzymes: No results for input(s): CKTOTAL, CKMB, CKMBINDEX, TROPONINI in the last 168 hours. BNP (last 3 results) No results for input(s): PROBNP in the last 8760 hours. HbA1C: No results for input(s): HGBA1C in the last 72 hours. CBG: Recent Labs  Lab 09/20/17 0746 09/20/17 1154 09/20/17 1636 09/20/17 2115 09/21/17 0756  GLUCAP 113* 118* 125* 140* 200*   Lipid Profile: No results for input(s): CHOL, HDL, LDLCALC, TRIG, CHOLHDL, LDLDIRECT in the last 72 hours. Thyroid Function Tests: No results for input(s): TSH, T4TOTAL, FREET4, T3FREE, THYROIDAB in the last 72 hours. Anemia Panel: No results for input(s): VITAMINB12, FOLATE, FERRITIN, TIBC, IRON, RETICCTPCT in the last 72 hours. Urine analysis:    Component Value Date/Time   COLORURINE DARK YELLOW 04/22/2017 0915   APPEARANCEUR CLEAR 04/22/2017 0915   LABSPEC 1.021 04/22/2017 0915   PHURINE 5.5 04/22/2017 0915   GLUCOSEU NEGATIVE 04/22/2017 0915   HGBUR NEGATIVE 04/22/2017 0915   BILIRUBINUR NEGATIVE 12/05/2015 1057   KETONESUR NEGATIVE 04/22/2017 0915   PROTEINUR 2+ (A) 04/22/2017 0915   UROBILINOGEN 0.2 11/13/2014 1119   NITRITE NEGATIVE 04/22/2017 0915   LEUKOCYTESUR NEGATIVE 04/22/2017 0915   Sepsis Labs: Invalid input(s): PROCALCITONIN, LACTICIDVEN  Recent Results (from the past 240 hour(s))  Blood culture (routine x 2)     Status: None (Preliminary result)   Collection Time: 09/05/2017  1:42 PM  Result Value Ref Range Status   Specimen Description BLOOD LEFT ANTECUBITAL  Final   Special Requests   Final    BOTTLES DRAWN AEROBIC AND ANAEROBIC Blood Culture results may not be optimal due to an inadequate volume of blood received in culture bottles    Culture   Final    NO GROWTH 3 DAYS Performed at [facility] [address], [place], [place] [address]    Report Status PENDING  Incomplete  Blood culture (routine x 2)     Status: Abnormal   Collection Time: 09/19/2017  3:45 PM  Result Value Ref Range Status   Specimen Description BLOOD RIGHT ANTECUBITAL  Final   Special Requests   Final    BOTTLES DRAWN AEROBIC AND ANAEROBIC Blood Culture adequate volume   Culture  Setup Time   Final    GRAM POSITIVE COCCI AEROBIC BOTTLE ONLY CRITICAL RESULT CALLED TO, READ BACK BY AND VERIFIED WITH: A. MASTERS PHARMD, AT 1556 09/19/17 BY D. VANHOOK    Culture (A)  Final    STAPHYLOCOCCUS SPECIES (COAGULASE NEGATIVE) THE SIGNIFICANCE OF ISOLATING THIS ORGANISM FROM A SINGLE SET OF BLOOD CULTURES WHEN MULTIPLE SETS ARE DRAWN IS UNCERTAIN. PLEASE NOTIFY THE MICROBIOLOGY DEPARTMENT WITHIN ONE WEEK IF SPECIATION AND SENSITIVITIES ARE  REQUIRED. Performed at [facility], [place] [address] [place], [place] [address]    Report Status Oct 13, 2017 FINAL  Final  Blood Culture ID Panel (Reflexed)     Status: Abnormal   Collection Time: 09/20/2017  3:45 PM  Result Value Ref Range Status   Enterococcus species NOT DETECTED NOT DETECTED Final   Listeria monocytogenes NOT DETECTED NOT DETECTED Final   Staphylococcus species DETECTED (A) NOT DETECTED Final    Comment: Methicillin (oxacillin) susceptible coagulase negative staphylococcus. Possible blood culture contaminant (unless isolated from more than one blood culture draw or clinical case suggests pathogenicity). No antibiotic treatment is indicated for blood  culture contaminants. CRITICAL RESULT CALLED TO, READ BACK BY AND VERIFIED WITH: A. MASTERS PHARMD, AT 1556 09/19/17 BY D. VANHOOK    Staphylococcus aureus NOT DETECTED NOT DETECTED Final   Methicillin resistance NOT DETECTED NOT DETECTED Final   Streptococcus species NOT DETECTED NOT DETECTED Final   Streptococcus agalactiae NOT DETECTED NOT  DETECTED Final   Streptococcus pneumoniae NOT DETECTED NOT DETECTED Final   Streptococcus pyogenes NOT DETECTED NOT DETECTED Final   Acinetobacter baumannii NOT DETECTED NOT DETECTED Final   Enterobacteriaceae species NOT DETECTED NOT DETECTED Final   Enterobacter cloacae complex NOT DETECTED NOT DETECTED Final   Escherichia coli NOT DETECTED NOT DETECTED Final   Klebsiella oxytoca NOT DETECTED NOT DETECTED Final   Klebsiella pneumoniae NOT DETECTED NOT DETECTED Final   Proteus species NOT DETECTED NOT DETECTED Final   Serratia marcescens NOT DETECTED NOT DETECTED Final   Haemophilus influenzae NOT DETECTED NOT DETECTED Final   Neisseria meningitidis NOT DETECTED NOT DETECTED Final   Pseudomonas aeruginosa NOT DETECTED NOT DETECTED Final   Candida albicans NOT DETECTED NOT DETECTED Final   Candida glabrata NOT DETECTED NOT DETECTED Final   Candida krusei NOT DETECTED NOT DETECTED Final   Candida parapsilosis NOT DETECTED NOT DETECTED Final   Candida tropicalis NOT DETECTED NOT DETECTED Final    Comment: Performed at [facility], [address], [place], [place] [address]  Culture, sputum-assessment     Status: None   Collection Time: 08/29/2017  4:25 PM  Result Value Ref Range Status   Specimen Description EXPECTORATED SPUTUM  Final   Special Requests NONE  Final   Sputum evaluation   Final    THIS SPECIMEN IS ACCEPTABLE FOR SPUTUM CULTURE Performed at [facility], [address] [place], [place] [address]    Report Status 09/09/2017 FINAL  Final  Culture, respiratory (NON-Expectorated)     Status: None   Collection Time: 09/10/2017  4:25 PM  Result Value Ref Range Status   Specimen Description EXPECTORATED SPUTUM  Final   Special Requests NONE Reflexed from [id number]  Final   Gram Stain   Final    MODERATE WBC PRESENT, PREDOMINANTLY PMN RARE SQUAMOUS EPITHELIAL CELLS PRESENT ABUNDANT GRAM NEGATIVE COCCOBACILLI FEW GRAM POSITIVE COCCI IN PAIRS    Culture   Final     ABUNDANT HAEMOPHILUS INFLUENZAE BETA LACTAMASE NEGATIVE Performed at [facility], [place] [address], [place], [place] [address]    Report Status 09/20/2017 FINAL  Final  Respiratory Panel by PCR     Status: None   Collection Time: 09/06/2017  5:15 PM  Result Value Ref Range Status   Adenovirus NOT DETECTED NOT DETECTED Final   Coronavirus 229E NOT DETECTED NOT DETECTED Final   Coronavirus HKU1 NOT DETECTED NOT DETECTED Final   Coronavirus NL63 NOT DETECTED NOT DETECTED Final  Coronavirus OC43 NOT DETECTED NOT DETECTED Final   Metapneumovirus NOT DETECTED NOT DETECTED Final   Rhinovirus / Enterovirus NOT DETECTED NOT DETECTED Final   Influenza A NOT DETECTED NOT DETECTED Final   Influenza B NOT DETECTED NOT DETECTED Final   Parainfluenza Virus 1 NOT DETECTED NOT DETECTED Final   Parainfluenza Virus 2 NOT DETECTED NOT DETECTED Final   Parainfluenza Virus 3 NOT DETECTED NOT DETECTED Final   Parainfluenza Virus 4 NOT DETECTED NOT DETECTED Final   Respiratory Syncytial Virus NOT DETECTED NOT DETECTED Final   Bordetella pertussis NOT DETECTED NOT DETECTED Final   Chlamydophila pneumoniae NOT DETECTED NOT DETECTED Final   Mycoplasma pneumoniae NOT DETECTED NOT DETECTED Final    Comment: Performed at [facility], [place] [address] [place], [place] [address]  MRSA PCR Screening     Status: None   Collection Time: 08/28/2017  9:08 PM  Result Value Ref Range Status   MRSA by PCR NEGATIVE NEGATIVE Final    Comment:        The GeneXpert MRSA Assay (FDA approved for NASAL specimens only), is one component of a comprehensive MRSA colonization surveillance program. It is not intended to diagnose MRSA infection nor to guide or monitor treatment for MRSA infections. Performed at [facility], [place] [address] [place], [place] [address]       Radiology Studies: No results found.   Scheduled Meds: . chlorhexidine  15 mL Mouth Rinse BID  . clobetasol ointment   Topical BID    . mouth rinse  15 mL Mouth Rinse q12n4p  .  morphine injection  2 mg Intravenous Q6H  . sodium chloride flush  3 mL Intravenous Q12H  . sodium polystyrene  30 g Oral Once   Continuous Infusions: . sodium chloride    . sodium chloride 250 mL (09/17/2017 2230)  . chlorproMAZINE (THORAZINE) IV       Marzetta Board, MD, PhD [facility] Pager [phone]  If 7PM-7AM, please contact night-coverage [email or web address] Password [facility] 10/05/17, 9:58 AM

## 2017-09-25 NOTE — Care Management Important Message (Signed)
Important Message  Patient Details  Name: BRAND SIEVER MRN: [id number] Date of Birth: May 09, 1940   Medicare Important Message Given:  Yes    Amneet Cendejas 2017-10-09, 1:40 PM

## 2017-09-25 NOTE — Progress Notes (Signed)
CSW following for discharge plan. Per chart review, patient is now full comfort care with a hospital death expected; not safe for transport to residential hospice at this time. Please consult CSW if needed for support or transition of care, if appropriate.  CSW signing off.  Laveda Abbe, [place] Clinical Social Worker [phone]

## 2017-09-25 NOTE — Discharge Summary (Signed)
Death Summary  [name] OYD:[id number] DOB: 1940/09/16 DOA: September 28, 2017  PCP: Ryan Pinto, MD  Admit date: 2017-09-28 Date of Death: 2017-10-02 Time of Death: 17:27 Notification: Ryan Pinto, MD notified of death of 2017/10/02   History of present illness:  Per Dr. Evangeline Andrews, [name] is a 77 y.o. male with medical history significant of type 2 diabetes, severe COPD, atrial fibrillation on Eliquis, presented with shortness of breath, weight and weakness which has been going on for several days.  [place] and his family had noted him to be very weak last night.  He is been staying that it is hard for him to get out of bed to walk this morning.  Family called EMS this morning they checked him out but the patient did not want to go to the emergency room at that time.  He was more altered and confused he also has a productive cough and audible rales.  Only thought he may have fallen this morning but they were not sure.  His daughter therefore drove him back into town and instead of taking him to his house she came to the emergency department.  Several years ago the patient had an emergency department visit and subsequent admission for COPD exacerbation requiring BiPAP.  Noted to be in rapid atrial fibrillation hypoxic in triage.  Also several years ago (2012) he was told that he had a mass in his right lung but did not wish for any further work-up. Patient currently denies nausea, vomiting, chest pain, chest pressure, headache, he is legally blind, neck pain, worsening of his baseline poor vision, dizziness, chills, diarrhea, constipation, rashes, swelling, enlarged lymph nodes. In the emergency department his white blood cell count was noted to be 40, his room air sat was 83%, his temperature was 100.5, x-ray was consistent with pneumonia with a possible mass.  This mass is in a different location than the previous one.  The patient was referred to me for  further evaluation and management.   Final Diagnoses:  Acute respiratory failure with hypoxia and hypercapnia with respiratory acidosis /community-acquired pneumonia/postobstructive pneumonia due to lung mass -patient with progressive hypoxia and acidosis requiring BiPAP. He is DNR / DNI. He has a history of a lung mass - likely cancer, followed by pulmonology in [place]. He apparently refused further work-up in the past, he refused bronchoscopy with biopsy.  Appears to have been progressive and now causing his respiratory failure. Palliative was consulted and after discussions regarding patient's wishes, family transitioned his care towards comfort. He passed on Oct 02, 2017 at 17:27 Sepsis secondary to postobstructive pneumonia/community acquired pneumonia -Sputum cultures growing Haemophilus, 1 out of 4 bottles blood culture growing coag negative staph, likely contaminant Right upper lobe lung mass -now comfort based approach A. 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    Significant Diagnostic Studies: Dg Chest 2 View  Result Date: September 28, 2017 CLINICAL DATA:  Short of breath.  Slightly slurred speech. EXAM: CHEST - 2 VIEW COMPARISON:  07/19/2013 FINDINGS: Focal masslike area of opacity is noted in the central right upper lobe projecting anterior to the superolateral right hilum. This measures approximately 4.5 cm on the AP view. Patchy consolidation is noted in the right lung base with milder opacity noted in the medial  left lung base. Right lung base opacity corresponds the right middle and adjacent right lower lobes. Remainder of the lungs is clear. No pleural effusion. No pneumothorax. Cardiac silhouette is mildly enlarged. No  mediastinal or left hilar masses or convincing adenopathy. Right hilum is partly obscured by the contiguous right upper lobe opacity. Skeletal structures are demineralized but grossly intact. IMPRESSION: 1. Masslike opacity in the central right upper lobe. This may reflect malignancy. It could reflect focal consolidation from pneumonia. Follow-up chest CT with contrast for further assessment is recommended. 2. Patchy right lung base opacity consistent with pneumonia, atelectasis or a combination. Milder left lung base opacity is likely atelectasis. 3. Mild cardiomegaly. Electronically Signed   By: Lajean Manes M.D.   On: 08/29/2017 13:18   Ct Head Wo Contrast  Result Date: 08/31/2017 CLINICAL DATA:  Slurred speech.  Fall. EXAM: CT HEAD WITHOUT CONTRAST CT CERVICAL SPINE WITHOUT CONTRAST TECHNIQUE: Multidetector CT imaging of the head and cervical spine was performed following the standard protocol without intravenous contrast. Multiplanar CT image reconstructions of the cervical spine were also generated. COMPARISON:  None. FINDINGS: CT HEAD FINDINGS Brain: Mild diffuse cortical atrophy is noted. Mild chronic ischemic white matter disease is noted. No mass effect or midline shift is noted. Ventricular size is within normal limits. There is no evidence of mass lesion, hemorrhage or acute infarction. Vascular: No hyperdense vessel or unexpected calcification. Skull: Normal. Negative for fracture or focal lesion. Sinuses/Orbits: No acute finding. Other: None. CT CERVICAL SPINE FINDINGS Alignment: Normal. Skull base and vertebrae: No acute fracture. No primary bone lesion or focal pathologic process. Soft tissues and spinal canal: No prevertebral fluid or swelling. No visible canal hematoma. Disc levels: Moderate degenerative disc disease is noted at C5-6. Severe degenerative disc disease is noted at C6-7 and C7-T1. Upper chest: Negative. Other: Degenerative changes are seen involving the left-sided posterior facet  joints. IMPRESSION: Mild diffuse cortical atrophy. Mild chronic ischemic white matter disease. No acute intracranial abnormality seen. Multilevel degenerative disc disease. No acute abnormality seen in the cervical spine. Electronically Signed   By: Marijo Conception, M.D.   On: 09/08/2017 13:53   Ct Chest Wo Contrast  Result Date: 09/13/2017 CLINICAL DATA:  Atrial fibrillation and hypoxia. Mass-like opacity in the central right upper lobe on chest radiographs earlier today as well as patchy opacity at the lung bases, greater on the right. EXAM: CT CHEST WITHOUT CONTRAST TECHNIQUE: Multidetector CT imaging of the chest was performed following the standard protocol without IV contrast. COMPARISON:  Chest radiographs obtained earlier today and on 07/19/2013. Chest CT dated 07/18/2013. FINDINGS: Cardiovascular: Atheromatous calcifications, including the coronary arteries and aorta. Mediastinum/Nodes: Enlarged right hilar lobes, difficult to separate from adjacent pulmonary vessels due to the lack of intravenous contrast. One of the nodes measures approximately 15 mm in short axis diameter on image number 76 series 3. Mildly enlarged subcarinal node with a short axis diameter of 12 mm on image number 80 series 3. No other enlarged mediastinal nodes and no enlarged left hilar nodes. No axillary adenopathy. Small, subcentimeter nodules in both lobes of the thyroid gland. Mildly enlarged right lobe of the thyroid gland. No visible esophageal abnormalities. Lungs/Pleura: Large mass-like opacity in the right upper lobe measuring 7.3 x 5.6 cm on image number 59 series 4 and 5.1 cm in length on coronal image number 42. This has a focal extension to the pleura with adjacent mild pleural thickening. There is bronchial encasement with the mass-like opacity some bronchial  narrowing. The current mass is at the location of the endobronchial mass seen on 07/18/2013. There is also patchy opacity more peripherally in the right upper  lobe. There is also an irregular nodule more inferiorly in the right upper lobe measuring 2.4 x 1.4 cm on image number 79 series 4. Also demonstrated is extensive patchy airspace opacity in the inferior aspects of both lower lobes. Also demonstrated is mild calcified posterior pleural plaque formation on the right. Upper Abdomen: Atheromatous arterial calcifications. Cholecystectomy clips. Upper pole right renal cyst. Periportal edema. Partially included aortocaval lymph node with a short axis diameter of 8 mm on image number 179 series 3. Musculoskeletal: Thoracic, lower cervical and upper lumbar spine degenerative changes. No evidence of bony metastatic disease. IMPRESSION: 1. 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 Rounded pneumonia is much less likely. 2. Additional 2.4 cm irregular nodule more inferiorly in the right upper lobe, suspicious for a metastasis or 2nd primary lung neoplasm. 3. Additional patchy, nodular opacities more peripherally in the right upper lobe, suspicious for metastases or pneumonia. 4. Extensive patchy opacity in the inferior aspects of both lower lobes, suspicious for pneumonia. 5. Probable metastatic right hilar and subcarinal adenopathy. 6.  Calcific coronary artery and aortic atherosclerosis. 7. Nonspecific right upper abdominal periportal edema. This could be due to hepatitis or acute portal vein occlusion. 8. Partially included borderline enlarged aortocaval lymph node in the upper abdomen. 9. Sub-centimeter thyroid nodule(s) noted, too small to characterize, but most likely benign in the absence of known clinical risk factors for thyroid carcinoma. Aortic Atherosclerosis (ICD10-I70.0). Electronically Signed   By: Claudie Revering M.D.   On: 09/13/2017 18:39   Ct Cervical Spine Wo Contrast  Result Date: 09/15/2017 CLINICAL DATA:  Slurred speech.  Fall. EXAM: CT HEAD WITHOUT CONTRAST CT CERVICAL SPINE WITHOUT CONTRAST  TECHNIQUE: Multidetector CT imaging of the head and cervical spine was performed following the standard protocol without intravenous contrast. Multiplanar CT image reconstructions of the cervical spine were also generated. COMPARISON:  None. FINDINGS: CT HEAD FINDINGS Brain: Mild diffuse cortical atrophy is noted. Mild chronic ischemic white matter disease is noted. No mass effect or midline shift is noted. Ventricular size is within normal limits. There is no evidence of mass lesion, hemorrhage or acute infarction. Vascular: No hyperdense vessel or unexpected calcification. Skull: Normal. Negative for fracture or focal lesion. Sinuses/Orbits: No acute finding. Other: None. CT CERVICAL SPINE FINDINGS Alignment: Normal. Skull base and vertebrae: No acute fracture. No primary bone lesion or focal pathologic process. Soft tissues and spinal canal: No prevertebral fluid or swelling. No visible canal hematoma. Disc levels: Moderate degenerative disc disease is noted at C5-6. Severe degenerative disc disease is noted at C6-7 and C7-T1. Upper chest: Negative. Other: Degenerative changes are seen involving the left-sided posterior facet joints. IMPRESSION: Mild diffuse cortical atrophy. Mild chronic ischemic white matter disease. No acute intracranial abnormality seen. Multilevel degenerative disc disease. No acute abnormality seen in the cervical spine. Electronically Signed   By: Marijo Conception, M.D.   On: 09/19/2017 13:53    Microbiology: Recent Results (from the past 240 hour(s))  Blood culture (routine x 2)     Status: None (Preliminary result)   Collection Time: 09/17/2017  1:42 PM  Result Value Ref Range Status   Specimen Description BLOOD LEFT ANTECUBITAL  Final   Special Requests   Final    BOTTLES DRAWN AEROBIC AND ANAEROBIC Blood  Culture results may not be optimal due to an inadequate volume of blood received in culture bottles   Culture   Final    NO GROWTH 4 DAYS Performed at [facility], [place] [address], [place], [place] [address]    Report Status PENDING  Incomplete  Blood culture (routine x 2)     Status: Abnormal   Collection Time: 09/15/2017  3:45 PM  Result Value Ref Range Status   Specimen Description BLOOD RIGHT ANTECUBITAL  Final   Special Requests   Final    BOTTLES DRAWN AEROBIC AND ANAEROBIC Blood Culture adequate volume   Culture  Setup Time   Final    GRAM POSITIVE COCCI AEROBIC BOTTLE ONLY CRITICAL RESULT CALLED TO, READ BACK BY AND VERIFIED WITH: A. MASTERS PHARMD, AT 1556 09/19/17 BY D. VANHOOK    Culture (A)  Final    STAPHYLOCOCCUS SPECIES (COAGULASE NEGATIVE) THE SIGNIFICANCE OF ISOLATING THIS ORGANISM FROM A SINGLE SET OF BLOOD CULTURES WHEN MULTIPLE SETS ARE DRAWN IS UNCERTAIN. PLEASE NOTIFY THE MICROBIOLOGY DEPARTMENT WITHIN ONE WEEK IF SPECIATION AND SENSITIVITIES ARE REQUIRED. Performed at [facility], [place] [address], [place], [place] [address]    Report Status 2017/10/02 FINAL  Final  Blood Culture ID Panel (Reflexed)     Status: Abnormal   Collection Time: 08/25/2017  3:45 PM  Result Value Ref Range Status   Enterococcus species NOT DETECTED NOT DETECTED Final   Listeria monocytogenes NOT DETECTED NOT DETECTED Final   Staphylococcus species DETECTED (A) NOT DETECTED Final    Comment: Methicillin (oxacillin) susceptible coagulase negative staphylococcus. Possible blood culture contaminant (unless isolated from more than one blood culture draw or clinical case suggests pathogenicity). No antibiotic treatment is indicated for blood  culture contaminants. CRITICAL RESULT CALLED TO, READ BACK BY AND VERIFIED WITH: A. MASTERS PHARMD, AT 1556 09/19/17 BY D. VANHOOK    Staphylococcus aureus NOT DETECTED NOT DETECTED Final   Methicillin resistance NOT DETECTED NOT DETECTED Final   Streptococcus species NOT DETECTED NOT DETECTED Final   Streptococcus agalactiae NOT DETECTED NOT DETECTED Final   Streptococcus pneumoniae NOT DETECTED NOT DETECTED  Final   Streptococcus pyogenes NOT DETECTED NOT DETECTED Final   Acinetobacter baumannii NOT DETECTED NOT DETECTED Final   Enterobacteriaceae species NOT DETECTED NOT DETECTED Final   Enterobacter cloacae complex NOT DETECTED NOT DETECTED Final   Escherichia coli NOT DETECTED NOT DETECTED Final   Klebsiella oxytoca NOT DETECTED NOT DETECTED Final   Klebsiella pneumoniae NOT DETECTED NOT DETECTED Final   Proteus species NOT DETECTED NOT DETECTED Final   Serratia marcescens NOT DETECTED NOT DETECTED Final   Haemophilus influenzae NOT DETECTED NOT DETECTED Final   Neisseria meningitidis NOT DETECTED NOT DETECTED Final   Pseudomonas aeruginosa NOT DETECTED NOT DETECTED Final   Candida albicans NOT DETECTED NOT DETECTED Final   Candida glabrata NOT DETECTED NOT DETECTED Final   Candida krusei NOT DETECTED NOT DETECTED Final   Candida parapsilosis NOT DETECTED NOT DETECTED Final   Candida tropicalis NOT DETECTED NOT DETECTED Final    Comment: Performed at [facility], [address] [place], [place] [address]  Culture, sputum-assessment     Status: None   Collection Time: 09/21/2017  4:25 PM  Result Value Ref Range Status   Specimen Description EXPECTORATED SPUTUM  Final   Special Requests NONE  Final   Sputum evaluation   Final    THIS SPECIMEN IS ACCEPTABLE FOR SPUTUM CULTURE Performed at [facility], [place] [address] [place], [place] [address]    Report Status 09/12/2017 FINAL  Final  Culture, respiratory (NON-Expectorated)     Status: None   Collection Time: 09/24/2017  4:25 PM  Result Value Ref Range Status   Specimen Description EXPECTORATED SPUTUM  Final   Special Requests NONE Reflexed from [id number]  Final   Gram Stain   Final    MODERATE WBC PRESENT, PREDOMINANTLY PMN RARE SQUAMOUS EPITHELIAL CELLS PRESENT ABUNDANT GRAM NEGATIVE COCCOBACILLI FEW GRAM POSITIVE COCCI IN PAIRS    Culture   Final    ABUNDANT HAEMOPHILUS INFLUENZAE BETA LACTAMASE NEGATIVE Performed at  [facility], [place] [address] [place], [place] [address]    Report Status 09/20/2017 FINAL  Final  Respiratory Panel by PCR     Status: None   Collection Time: 09/01/2017  5:15 PM  Result Value Ref Range Status   Adenovirus NOT DETECTED NOT DETECTED Final   Coronavirus 229E NOT DETECTED NOT DETECTED Final   Coronavirus HKU1 NOT DETECTED NOT DETECTED Final   Coronavirus NL63 NOT DETECTED NOT DETECTED Final   Coronavirus OC43 NOT DETECTED NOT DETECTED Final   Metapneumovirus NOT DETECTED NOT DETECTED Final   Rhinovirus / Enterovirus NOT DETECTED NOT DETECTED Final   Influenza A NOT DETECTED NOT DETECTED Final   Influenza B NOT DETECTED NOT DETECTED Final   Parainfluenza Virus 1 NOT DETECTED NOT DETECTED Final   Parainfluenza Virus 2 NOT DETECTED NOT DETECTED Final   Parainfluenza Virus 3 NOT DETECTED NOT DETECTED Final   Parainfluenza Virus 4 NOT DETECTED NOT DETECTED Final   Respiratory Syncytial Virus NOT DETECTED NOT DETECTED Final   Bordetella pertussis NOT DETECTED NOT DETECTED Final   Chlamydophila pneumoniae NOT DETECTED NOT DETECTED Final   Mycoplasma pneumoniae NOT DETECTED NOT DETECTED Final    Comment: Performed at [facility], [place] [address] [place], [place] [address]  MRSA PCR Screening     Status: None   Collection Time: 08/25/2017  9:08 PM  Result Value Ref Range Status   MRSA by PCR NEGATIVE NEGATIVE Final    Comment:        The GeneXpert MRSA Assay (FDA approved for NASAL specimens only), is one component of a comprehensive MRSA colonization surveillance program. It is not intended to diagnose MRSA infection nor to guide or monitor treatment for MRSA infections. Performed at [facility], [place] [address] [place], [place] [address]      Labs: Basic Metabolic Panel: Recent Labs  Lab 09/08/2017 1350 09/02/2017 1419 09/19/17 0219 09/20/17 0301 09/21/17 0222  NA 138 137 140 142 144  K 5.1 5.6* 5.4* 5.4* 6.0*  CL 98* 101 106 106 106  CO2 26   --  24 29 26   GLUCOSE 181* 176* 123* 114* 170*  BUN 31* 45* 35* 35* 40*  CREATININE 1.28* 1.00 1.11 0.97 0.98  CALCIUM 10.5*  --  9.1 9.5 9.4   Liver Function Tests: Recent Labs  Lab 09/21/2017 1350  AST 23  ALT 25  ALKPHOS 80  BILITOT 1.3*  PROT 8.4*  ALBUMIN 4.0   No results for input(s): LIPASE, AMYLASE in the last 168 hours. No results for input(s): AMMONIA in the last 168 hours. CBC: Recent Labs  Lab 08/26/2017 1350 09/24/2017 1419 09/19/17 0219 09/20/17 0301 09/21/17 0222  WBC 40.9*  --  40.5* 28.3* 18.8*  NEUTROABS 34.4*  --  32.8*  --   --   HGB 15.9 17.0 14.0 13.5  13.8  HCT 49.9 50.0 45.7 45.3 46.2  MCV 86.6  --  89.8 93.0 92.6  PLT 456*  --  399 361 379   Cardiac Enzymes: No results for input(s): CKTOTAL, CKMB, CKMBINDEX, TROPONINI in the last 168 hours. D-Dimer No results for input(s): DDIMER in the last 72 hours. BNP: Invalid input(s): POCBNP CBG: Recent Labs  Lab 09/20/17 0746 09/20/17 1154 09/20/17 1636 09/20/17 2115 09/21/17 0756  GLUCAP 113* 118* 125* 140* 200*   Anemia work up No results for input(s): VITAMINB12, FOLATE, FERRITIN, TIBC, IRON, RETICCTPCT in the last 72 hours. Urinalysis    Component Value Date/Time   COLORURINE DARK YELLOW 04/22/2017 0915   APPEARANCEUR CLEAR 04/22/2017 0915   LABSPEC 1.021 04/22/2017 0915   PHURINE 5.5 04/22/2017 0915   GLUCOSEU NEGATIVE 04/22/2017 0915   HGBUR NEGATIVE 04/22/2017 0915   BILIRUBINUR NEGATIVE 12/05/2015 1057   KETONESUR NEGATIVE 04/22/2017 0915   PROTEINUR 2+ (A) 04/22/2017 0915   UROBILINOGEN 0.2 11/13/2014 1119   NITRITE NEGATIVE 04/22/2017 0915   LEUKOCYTESUR NEGATIVE 04/22/2017 0915   Sepsis Labs Invalid input(s): PROCALCITONIN,  WBC,  LACTICIDVEN   SIGNED:  Marzetta Board, MD  [facility] 10/20/17, 6:15 PM Pager   If 7PM-7AM, please contact night-coverage [email or web address] Password [email or web address]

## 2017-09-25 NOTE — Progress Notes (Signed)
TOD 1727 second verifier, [name], RN Family at bedside at [facility] information as follows  [name]  (Da) [address] [place] [address] Cell  [place]  [name] (sister) [address] [place] [address] [phone]  [name]

## 2017-09-25 NOTE — Progress Notes (Addendum)
Daily Progress Note   Patient Name: [name]       Date: 2017-10-18 DOB: 07/17/1940  Age: 77 y.o. MRN#: [id number] Attending Physician: Caren Griffins, MD Primary Care Physician: Unk Pinto, MD Admit Date: 09/14/2017  Reason for Consultation/Follow-up: Establishing goals of care and Terminal Care  Subjective: Patient unresponsive to sternal rub. Appears comfortable with no s/s of distress. Actively dying.  Daughter and son-in-law at bedside. Again educated on EOL expectations and symptom management regimen. Therapeutic listening as family shares stories of Mr. Trautman and his joy with his [place] [employer]. Emotional support provided.  1400: F/u with patient and family at bedside. Per family, patient warm. Cool washcloth applied to forehead and room temperature changed. Patient remains on comfort screen with afib RVR HR between 130's-160. Patient appears comfortable at bedside. RN to give metoprolol x1. Encouraged her to give prn ativan if necessary. Also, tylenol suppository on MAR if needed.   Emotional support provided to family at bedside.   Length of Stay: 4  Current Medications: Scheduled Meds:  . chlorhexidine  15 mL Mouth Rinse BID  . clobetasol ointment   Topical BID  . mouth rinse  15 mL Mouth Rinse q12n4p  .  morphine injection  2 mg Intravenous Q6H  . sodium chloride flush  3 mL Intravenous Q12H  . sodium polystyrene  30 g Oral Once    Continuous Infusions: . sodium chloride    . sodium chloride 250 mL (08/25/2017 2230)  . chlorproMAZINE (THORAZINE) IV      PRN Meds: sodium chloride, sodium chloride, acetaminophen **OR** acetaminophen, antiseptic oral rinse, chlorpheniramine-HYDROcodone, chlorproMAZINE (THORAZINE) IV, diphenhydrAMINE,  [DISCONTINUED] glycopyrrolate **OR** [DISCONTINUED] glycopyrrolate **OR** glycopyrrolate, [DISCONTINUED] haloperidol **OR** haloperidol **OR** haloperidol lactate, ipratropium-albuterol, [DISCONTINUED] LORazepam **OR** LORazepam **OR** LORazepam, morphine injection, ondansetron **OR** ondansetron (ZOFRAN) IV, polyvinyl alcohol, sodium chloride flush, sodium chloride flush  Physical Exam  Constitutional: He appears ill.  HENT:  Head: Normocephalic and atraumatic.  Cardiovascular: An irregularly irregular rhythm present.  Pulmonary/Chest: No accessory muscle usage. No tachypnea. No respiratory distress.  Neurological: He is unresponsive.  Skin: Skin is warm and dry.  Psychiatric: He is noncommunicative.  Nursing note and vitals reviewed.          Vital Signs: BP 107/86 (BP Location: Right Arm)   Pulse  93   Temp 97.8 F (36.6 C) (Axillary)   Resp (!) 22   Ht 5\' 11"  (1.803 m)   Wt 80.7 kg (178 lb)   SpO2 99%   BMI 24.83 kg/m  SpO2: SpO2: 99 % O2 Device: O2 Device: Nasal Cannula O2 Flow Rate: O2 Flow Rate (L/min): 3 L/min  Intake/output summary:   Intake/Output Summary (Last 24 hours) at 10-18-17 1022 Last data filed at Oct 18, 2017 0350 Gross per 24 hour  Intake 3 ml  Output 800 ml  Net -797 ml   LBM: Last BM Date: 09/17/17 Baseline Weight: Weight: 82.3 kg (181 lb 7 oz) Most recent weight: Weight: 80.7 kg (178 lb)       Palliative Assessment/Data: PPS 10%   Flowsheet Rows     Most Recent Value  Intake Tab  Referral Department  Hospitalist  Unit at Time of Referral  Intermediate Care Unit  Palliative Care Primary Diagnosis  Pulmonary  Date Notified  09/19/17  Palliative Care Type  New Palliative care  Reason for referral  Clarify Goals of Care  Date of Admission  09/04/2017  Date first seen by Palliative Care  09/20/17  # of days Palliative referral response time  1 Day(s)  # of days IP prior to Palliative referral  1  Clinical Assessment  Palliative Performance  Scale Score  10%  Psychosocial & Spiritual Assessment  Palliative Care Outcomes  Patient/Family meeting held?  Yes  Who was at the meeting?  daughter, son-in-law  Palliative Care Outcomes  Clarified goals of care, Counseled regarding hospice, Provided end of life care assistance, ACP counseling assistance, Provided psychosocial or spiritual support      Patient Active Problem List   Diagnosis Date Noted  . Terminal care   . Shortness of breath   . Severe sepsis ([place])   . Palliative care by specialist   . Acute respiratory failure ([place]) 09/11/2017  . Community acquired pneumonia of right lower lobe of lung ([place]) 09/13/2017  . Hearing loss 11/13/2014  . Atrial fibrillation ([place]) 11/13/2014  . At high risk for falls 11/13/2014  . Goals of care, counseling/discussion 11/07/2014  . Legal blindness -macular degeneration with retinopathy 08/06/2014  . Severe chronic obstructive pulmonary disease ([place]) 10/26/2013  . Mass of upper lobe of right lung 03/21/2013  . Essential hypertension 03/09/2013  . Vitamin D deficiency 03/09/2013  . Medication management 03/09/2013  . Melanoma of skin ([place]) 12/28/2012  . History of splenectomy-for hereditary spherocytosis 12/28/2012  . 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 Hyperlipidemia 10/03/2008  . BMI 25.0-25.9,adult 10/03/2008  . BPH/prostatism 10/03/2008    Palliative Care Assessment & Plan   Patient Profile: 77 y.o. male  with past medical history of hypertension, hypercholesteremia, COPD, DM, afib on coumadin, macular degeneration, and right lung mass diagnosed in 2012 admitted on 09/05/2017 with shortness of breath and weakness. ED workup signification for postobstructive pneumonia with large right upper lobe mass wrapping around right mainstem bronchus. Followed by outpatient pulmonology for right lung mass that he refused workup for. Attending has spoke with Dr. Lisbeth Renshaw regarding radiation, which is not an option due to unknown pathology. On 5/27,  patient with increased lethargy. ABG revealed CO2 65. Patient receiving IV antibiotics and steroids. Placed on BiPAP. Palliative medicine consultation for goals of care.   Assessment: Acute respiratory failure with hypoxia and hypercapnia Sepsis Postobstructive pneumonia/community-acquired pneumonia RUL lung mass   Acute encephalopathy Afib RVR Severe COPD with exacerbation  Recommendations/Plan:  After extensive conversations with family, transition  to comfort measures only. DNR/DNI.  Discontinued labs/interventions/medications not aimed at comfort.   Symptom management  Morphine 2mg  q4h scheduled   Morphine 2-4mg  IV q32min prn pain/dyspnea/air hunger  Ativan 1mg  IV q4h prn anxiety/agitation  Robinul 0.2mg  IV q4h prn secretions  Metoprolol 5mg  IV x1   May require continuous morphine infusion if requiring frequent morphine prn doses.  Actively dying. RN may pronounce. Prognosis likely hours-days.  Code Status: DNR/DNI   Code Status Orders  (From admission, onward)        Start     Ordered   09/23/2017 1922  Do not attempt resuscitation (DNR)  Continuous    Question Answer Comment  In the event of cardiac or respiratory ARREST Do not call a "code blue"   In the event of cardiac or respiratory ARREST Do not perform Intubation, CPR, defibrillation or ACLS   In the event of cardiac or respiratory ARREST Use medication by any route, position, wound care, and other measures to relive pain and suffering. May use oxygen, suction and manual treatment of airway obstruction as needed for comfort.      09/08/2017 1924    Code Status History    Date Active Date Inactive Code Status Order ID Comments User Context   09/10/2017 1546 09/12/2017 1924 Full Code [id number]  Lady Deutscher, MD ED       Prognosis:   Hours - Days  Discharge Planning:  To Be Determined  Care plan was discussed with multiple family members at bedside, RN  Thank you for allowing the Palliative  Medicine Team to assist in the care of this patient.   Time In: 0815- 1400- Time Out: 0850 1415 Total Time 50 min Prolonged Time Billed no      Greater than 50%  of this time was spent counseling and coordinating care related to the above assessment and plan.  Ihor Dow, FNP-C Palliative Medicine Team  Phone: [phone] Fax: [phone]  Please contact Palliative Medicine Team phone at [phone] for questions and concerns.

## 2017-09-25 DEATH — deceased

## 2017-11-12 ENCOUNTER — Encounter: Payer: Self-pay | Admitting: Internal Medicine

## 2017-12-02 ENCOUNTER — Encounter: Payer: Self-pay | Admitting: Internal Medicine

## 2018-02-18 ENCOUNTER — Ambulatory Visit: Payer: Self-pay | Admitting: Physician Assistant

## 2018-03-10 ENCOUNTER — Ambulatory Visit: Payer: Self-pay | Admitting: Physician Assistant

## 2018-04-22 ENCOUNTER — Ambulatory Visit: Payer: Self-pay | Admitting: Adult Health

## 2018-05-03 ENCOUNTER — Encounter: Payer: Self-pay | Admitting: Physician Assistant
